# Patient Record
Sex: Female | Born: 1960 | Race: White | Hispanic: No | Marital: Married | Smoking: Former smoker
Health system: Southern US, Community
[De-identification: ages and names within clinical notes are randomized; demographics above are authoritative.]

## PROBLEM LIST (undated history)

## (undated) DIAGNOSIS — Z8719 Personal history of other diseases of the digestive system: Secondary | ICD-10-CM

## (undated) DIAGNOSIS — E785 Hyperlipidemia, unspecified: Secondary | ICD-10-CM

## (undated) DIAGNOSIS — E119 Type 2 diabetes mellitus without complications: Secondary | ICD-10-CM

## (undated) DIAGNOSIS — E039 Hypothyroidism, unspecified: Secondary | ICD-10-CM

## (undated) DIAGNOSIS — I10 Essential (primary) hypertension: Secondary | ICD-10-CM

## (undated) HISTORY — DX: Type 2 diabetes mellitus without complications: E11.9

## (undated) HISTORY — DX: Essential (primary) hypertension: I10

## (undated) HISTORY — PX: CARDIAC CATHETERIZATION: SHX172

## (undated) HISTORY — DX: Hypothyroidism, unspecified: E03.9

## (undated) HISTORY — PX: STAPEDECTOMY: SHX2435

## (undated) HISTORY — DX: Hyperlipidemia, unspecified: E78.5

---

## 1993-07-14 HISTORY — PX: ABDOMINAL HYSTERECTOMY: SHX81

## 1997-11-09 ENCOUNTER — Ambulatory Visit (HOSPITAL_COMMUNITY): Admission: RE | Admit: 1997-11-09 | Discharge: 1997-11-09 | Payer: Self-pay | Admitting: Internal Medicine

## 1999-02-26 ENCOUNTER — Other Ambulatory Visit: Admission: RE | Admit: 1999-02-26 | Discharge: 1999-02-26 | Payer: Self-pay | Admitting: Obstetrics and Gynecology

## 2001-03-01 ENCOUNTER — Other Ambulatory Visit: Admission: RE | Admit: 2001-03-01 | Discharge: 2001-03-01 | Payer: Self-pay | Admitting: Obstetrics and Gynecology

## 2002-03-02 ENCOUNTER — Other Ambulatory Visit: Admission: RE | Admit: 2002-03-02 | Discharge: 2002-03-02 | Payer: Self-pay | Admitting: Obstetrics and Gynecology

## 2005-05-19 ENCOUNTER — Encounter: Admission: RE | Admit: 2005-05-19 | Discharge: 2005-05-19 | Payer: Self-pay | Admitting: Obstetrics and Gynecology

## 2005-05-30 ENCOUNTER — Encounter: Admission: RE | Admit: 2005-05-30 | Discharge: 2005-05-30 | Payer: Self-pay | Admitting: Obstetrics and Gynecology

## 2006-05-07 ENCOUNTER — Encounter: Admission: RE | Admit: 2006-05-07 | Discharge: 2006-05-07 | Payer: Self-pay | Admitting: Obstetrics and Gynecology

## 2006-09-17 ENCOUNTER — Encounter: Admission: RE | Admit: 2006-09-17 | Discharge: 2006-09-17 | Payer: Self-pay | Admitting: General Surgery

## 2007-05-10 ENCOUNTER — Encounter: Admission: RE | Admit: 2007-05-10 | Discharge: 2007-05-10 | Payer: Self-pay | Admitting: Obstetrics and Gynecology

## 2007-05-12 ENCOUNTER — Encounter: Admission: RE | Admit: 2007-05-12 | Discharge: 2007-05-12 | Payer: Self-pay | Admitting: Obstetrics and Gynecology

## 2008-05-10 ENCOUNTER — Encounter: Admission: RE | Admit: 2008-05-10 | Discharge: 2008-05-10 | Payer: Self-pay | Admitting: Podiatry

## 2009-05-11 ENCOUNTER — Encounter: Admission: RE | Admit: 2009-05-11 | Discharge: 2009-05-11 | Payer: Self-pay | Admitting: Obstetrics and Gynecology

## 2010-05-13 ENCOUNTER — Encounter: Admission: RE | Admit: 2010-05-13 | Discharge: 2010-05-13 | Payer: Self-pay | Admitting: Obstetrics and Gynecology

## 2010-08-04 ENCOUNTER — Encounter: Payer: Self-pay | Admitting: Obstetrics and Gynecology

## 2011-03-25 ENCOUNTER — Other Ambulatory Visit: Payer: Self-pay | Admitting: Obstetrics and Gynecology

## 2011-03-25 DIAGNOSIS — Z1231 Encounter for screening mammogram for malignant neoplasm of breast: Secondary | ICD-10-CM

## 2011-05-15 ENCOUNTER — Ambulatory Visit
Admission: RE | Admit: 2011-05-15 | Discharge: 2011-05-15 | Disposition: A | Discharge status: Home / Self Care | Payer: BC Managed Care – PPO | Source: Ambulatory Visit | Attending: Obstetrics and Gynecology | Admitting: Obstetrics and Gynecology

## 2011-05-15 DIAGNOSIS — Z1231 Encounter for screening mammogram for malignant neoplasm of breast: Secondary | ICD-10-CM

## 2011-06-30 ENCOUNTER — Ambulatory Visit (INDEPENDENT_AMBULATORY_CARE_PROVIDER_SITE_OTHER): Payer: BC Managed Care – PPO | Admitting: Family Medicine

## 2011-06-30 DIAGNOSIS — E119 Type 2 diabetes mellitus without complications: Secondary | ICD-10-CM

## 2011-06-30 DIAGNOSIS — I10 Essential (primary) hypertension: Secondary | ICD-10-CM

## 2011-06-30 DIAGNOSIS — F17200 Nicotine dependence, unspecified, uncomplicated: Secondary | ICD-10-CM

## 2011-09-24 ENCOUNTER — Telehealth: Payer: Self-pay

## 2011-09-29 ENCOUNTER — Ambulatory Visit: Payer: BC Managed Care – PPO | Admitting: Family Medicine

## 2011-10-20 ENCOUNTER — Ambulatory Visit (INDEPENDENT_AMBULATORY_CARE_PROVIDER_SITE_OTHER): Payer: BC Managed Care – PPO | Admitting: Family Medicine

## 2011-10-20 ENCOUNTER — Encounter: Payer: Self-pay | Admitting: Family Medicine

## 2011-10-20 VITALS — BP 124/71 | HR 68 | Temp 98.2°F | Resp 16 | Ht 63.75 in | Wt 130.2 lb

## 2011-10-20 DIAGNOSIS — M7700 Medial epicondylitis, unspecified elbow: Secondary | ICD-10-CM

## 2011-10-20 DIAGNOSIS — J309 Allergic rhinitis, unspecified: Secondary | ICD-10-CM | POA: Insufficient documentation

## 2011-10-20 DIAGNOSIS — E119 Type 2 diabetes mellitus without complications: Secondary | ICD-10-CM

## 2011-10-20 DIAGNOSIS — E785 Hyperlipidemia, unspecified: Secondary | ICD-10-CM

## 2011-10-20 DIAGNOSIS — E039 Hypothyroidism, unspecified: Secondary | ICD-10-CM

## 2011-10-20 DIAGNOSIS — F17200 Nicotine dependence, unspecified, uncomplicated: Secondary | ICD-10-CM

## 2011-10-20 DIAGNOSIS — I10 Essential (primary) hypertension: Secondary | ICD-10-CM

## 2011-10-20 LAB — LIPID PANEL
Cholesterol: 145 mg/dL (ref 0–200)
HDL: 35 mg/dL — ABNORMAL LOW (ref >39)
LDL Cholesterol: 78 mg/dL (ref 0–99)
Total CHOL/HDL Ratio: 4.1 Ratio
Triglycerides: 159 mg/dL — ABNORMAL HIGH (ref <150)
VLDL: 32 mg/dL (ref 0–40)

## 2011-10-20 LAB — POCT GLYCOSYLATED HEMOGLOBIN (HGB A1C): Hemoglobin A1C: 6.7

## 2011-10-20 NOTE — Progress Notes (Signed)
  Subjective:    Patient ID: Carmen Anderson, female    DOB: Jul 18, 1960, 51 y.o.   MRN: [id number]  HPI Patient presents for routine follow up.  DM- compliant with medications; denies side effects.         BS's all under 120!  Last eye exam 1/13.  No evidence of diabetic retinopathy.         No future neuropathic symptoms with Elavil. No side effects with Elavil.  Hypothyroid- fatigue secondary to job promotion; patient does not believe symptoms  secondary to thyroid disease.  Hypertension- Denies headaches, SOB or CP.  Dyslipidemia- Denies myalgas or nausea with Statin.  (B) Elbow pain; medial aspect since lifting file folders.  Allergic Rhinitis- using Zyrtec regularly  Exercising several days a week. Review of Systems     Objective:   Physical Exam  Constitutional: She appears well-developed.  HENT:  Nose: Nose normal.  Mouth/Throat: Oropharynx is clear and moist.  Neck: Neck supple. No thyromegaly present.  Cardiovascular: Normal rate, regular rhythm and normal heart sounds.   Pulmonary/Chest: Breath sounds normal.  Abdominal: Soft. Bowel sounds are normal. There is no hepatosplenomegaly. There is no tenderness.  Musculoskeletal: Normal range of motion.  Lymphadenopathy:    She has no cervical adenopathy.  Neurological: She is alert.  Skin: Skin is warm.       Neuropathic changes; no ulcerations or other lesions      Results for orders placed in visit on 10/20/11  POCT GLYCOSYLATED HEMOGLOBIN (HGB A1C)      Component Value Range   Hemoglobin A1C 6.7         Assessment & Plan:   1. Hyperlipidemia  Lipid panel  2. Hypertension    3. Allergic rhinitis    4. Hypothyroid    5. DM type 2 (diabetes mellitus, type 2)  POCT glycosylated hemoglobin (Hb A1C), Microalbumin, urine  6. Medial epicondylitis of elbow    7. Tobacco dependence      Will contact patient once labs are back Counseled patient regarding the importance of smoking cessation.  Offered to help  once patient ready, Anticipatory guidance

## 2011-10-21 LAB — MICROALBUMIN, URINE: Microalb, Ur: 0.5 mg/dL (ref 0.00–1.89)

## 2011-12-31 ENCOUNTER — Other Ambulatory Visit: Payer: Self-pay | Admitting: Family Medicine

## 2012-01-05 ENCOUNTER — Other Ambulatory Visit: Payer: Self-pay | Admitting: Family Medicine

## 2012-01-19 ENCOUNTER — Encounter: Payer: BC Managed Care – PPO | Admitting: Family Medicine

## 2012-01-21 ENCOUNTER — Ambulatory Visit (INDEPENDENT_AMBULATORY_CARE_PROVIDER_SITE_OTHER): Payer: BC Managed Care – PPO | Admitting: Family Medicine

## 2012-01-21 ENCOUNTER — Encounter: Payer: Self-pay | Admitting: Family Medicine

## 2012-01-21 VITALS — BP 104/64 | HR 63 | Temp 97.0°F | Resp 16 | Ht 63.5 in | Wt 122.4 lb

## 2012-01-21 DIAGNOSIS — E039 Hypothyroidism, unspecified: Secondary | ICD-10-CM

## 2012-01-21 DIAGNOSIS — E785 Hyperlipidemia, unspecified: Secondary | ICD-10-CM

## 2012-01-21 DIAGNOSIS — E119 Type 2 diabetes mellitus without complications: Secondary | ICD-10-CM

## 2012-01-21 DIAGNOSIS — IMO0001 Reserved for inherently not codable concepts without codable children: Secondary | ICD-10-CM

## 2012-01-21 DIAGNOSIS — M7710 Lateral epicondylitis, unspecified elbow: Secondary | ICD-10-CM

## 2012-01-21 LAB — LIPID PANEL
Cholesterol: 129 mg/dL (ref 0–200)
HDL: 32 mg/dL — ABNORMAL LOW (ref >39)
LDL Cholesterol: 72 mg/dL (ref 0–99)
Total CHOL/HDL Ratio: 4 Ratio
Triglycerides: 125 mg/dL (ref <150)
VLDL: 25 mg/dL (ref 0–40)

## 2012-01-21 LAB — BASIC METABOLIC PANEL
BUN: 10 mg/dL (ref 6–23)
CO2: 27 mEq/L (ref 19–32)
Calcium: 10.2 mg/dL (ref 8.4–10.5)
Chloride: 105 mEq/L (ref 96–112)
Creat: 0.66 mg/dL (ref 0.50–1.10)
Glucose, Bld: 139 mg/dL — ABNORMAL HIGH (ref 70–99)
Potassium: 4.5 mEq/L (ref 3.5–5.3)
Sodium: 140 mEq/L (ref 135–145)

## 2012-01-21 LAB — ALT: ALT: 14 U/L (ref 0–35)

## 2012-01-21 LAB — TSH: TSH: 2.243 u[IU]/mL (ref 0.350–4.500)

## 2012-01-21 LAB — POCT GLYCOSYLATED HEMOGLOBIN (HGB A1C): Hemoglobin A1C: 6.8

## 2012-01-21 MED ORDER — ATORVASTATIN CALCIUM 40 MG PO TABS: 40.0000 mg | ORAL_TABLET | Freq: Every day | ORAL | Status: DC

## 2012-01-21 MED ORDER — LISINOPRIL 5 MG PO TABS: 5.0000 mg | ORAL_TABLET | Freq: Every day | ORAL | Status: DC

## 2012-01-21 MED ORDER — AMITRIPTYLINE HCL 10 MG PO TABS: 10.0000 mg | ORAL_TABLET | Freq: Every day | ORAL | Status: DC

## 2012-01-21 MED ORDER — METFORMIN HCL ER 500 MG PO TB24: ORAL_TABLET | ORAL | Status: DC

## 2012-01-21 MED ORDER — LEVOTHYROXINE SODIUM 50 MCG PO TABS: 50.0000 ug | ORAL_TABLET | Freq: Every day | ORAL | Status: DC

## 2012-01-21 NOTE — Progress Notes (Signed)
  Subjective:    Patient ID: Carmen Anderson, female    DOB: 02/05/1961, 51 y.o.   MRN: [id number]  HPI  This 51 y.o. Cauc female has Type II DM, HTN, Hypothyroidism and Hyperlipidemia. She also has   mild Diabetic Neuropathy. FSBS= 80- <150s. Pt and husband are eating healthy/ following a meal plan  and walking. Pt has lost > 10 pounds. She denies hypoglycemia. Current Metformin dose is 1 tab after breakfast  and 2 tabs after evening meal. No medication intolerance.         New complaint: left elbow pain x few weeks. No acute injury but has increased pain when reaching overhead and  when lifting items weighing more than a few pounds. No swelling or redness. Has not taken any OTC analgesics; tries  to avoid NSAIDs because of concern for kidneys.  Review of Systems  Constitutional: Negative.   Eyes: Negative for visual disturbance.  Respiratory: Negative for chest tightness and shortness of breath.   Cardiovascular: Negative for chest pain, palpitations and leg swelling.  Gastrointestinal: Negative for abdominal pain and abdominal distention.  Musculoskeletal:       Left elbow pain- lateral aspect  Neurological: Negative.        Objective:   Physical Exam  Nursing note and vitals reviewed. Constitutional: She is oriented to person, place, and time. She appears well-developed and well-nourished. No distress.  HENT:  Head: Normocephalic and atraumatic.  Eyes: Conjunctivae and EOM are normal. No scleral icterus.  Cardiovascular: Normal rate and regular rhythm.   Pulmonary/Chest: Effort normal. No respiratory distress.  Musculoskeletal: Normal range of motion. She exhibits no edema.       Left elbow: tender lateral epicondyle, no redness,effusion or swelling  Neurological: She is alert and oriented to person, place, and time. No cranial nerve deficit.  Skin: Skin is warm and dry.   Results for orders placed in visit on 01/21/12  POCT GLYCOSYLATED HEMOGLOBIN (HGB A1C)      Component  Value Range   Hemoglobin A1C 6.8            Assessment & Plan:   1. Type II or unspecified type diabetes mellitus without mention of complication, not stated as uncontrolled  Basic metabolic panel Continue current dose of Metformin 500 mg 1 tablet after breakfast and 2 tabs after dinner; RF 90-day with 1 RF Continue with other lifestyle changes (weight down 11 lbs since Jan 2013)  2. Hypothyroidism  TSH Medication RF: Levothyroxine 50 mcg  90-day supply with 1 RF  3. Dyslipidemia  Lipid panel, ALT Medication RF: Atorvastatin 90-day supply with 1 RF  4. Epicondylitis, lateral (tennis elbow)  Symptomatic treatment with topical analgesic

## 2012-01-21 NOTE — Patient Instructions (Signed)
Tennis Elbow Your caregiver has diagnosed you with a condition often referred to as "tennis elbow." This results from small tears or soreness (inflammation) at the start (origin) of the extensor muscles of the forearm. Although the condition is often called tennis or golfer's elbow, it is caused by any repetitive action performed by your elbow. HOME CARE INSTRUCTIONS  If the condition has been short lived, rest may be the only treatment required. Using your opposite hand or arm to perform the task may help. Even changing your grip may help rest the extremity. These may even prevent the condition from recurring.   Longer standing problems, however, will often be relieved faster by:   Using anti-inflammatory agents.   Applying ice packs for 30 minutes at the end of the working day, at bed time, or when activities are finished.   Your caregiver may also have you wear a splint or sling. This will allow the inflamed tendon to heal.  At times, steroid injections aided with a local anesthetic will be required along with splinting for 1 to 2 weeks. Two to three steroid injections will often solve the problem. In some long standing cases, the inflamed tendon does not respond to conservative (non-surgical) therapy. Then surgery may be required to repair it. MAKE SURE YOU:   Understand these instructions.   Will watch your condition.   Will get help right away if you are not doing well or get worse.  Document Released: 06/30/2005 Document Revised: 06/19/2011 Document Reviewed: 02/16/2008 [facility] Patient Information 2012 [place], [place].    Tendinitis Tendinitis is swelling and inflammation of the tendons. Tendons are band-like tissues that connect muscle to bone. Tendinitis commonly occurs in the:   Shoulders (rotator cuff).   Heels (Achilles tendon).   Elbows (triceps tendon).  CAUSES Tendinitis is usually caused by overusing the tendon, muscles, and joints involved. When the tissue  surrounding a tendon (synovium) becomes inflamed, it is called tenosynovitis. Tendinitis commonly develops in people whose jobs require repetitive motions. SYMPTOMS  Pain.   Tenderness.   Mild swelling.  DIAGNOSIS Tendinitis is usually diagnosed by physical exam. Your caregiver may also order X-rays or other imaging tests. TREATMENT Your caregiver may recommend certain medicines or exercises for your treatment. HOME CARE INSTRUCTIONS   Use a sling or splint for as long as directed by your caregiver until the pain decreases.   Put ice on the injured area.   Put ice in a plastic bag.   Place a towel between your skin and the bag.   Leave the ice on for 15 to 20 minutes, 3 to 4 times a day.   Avoid using the limb while the tendon is painful. Perform gentle range of motion exercises only as directed by your caregiver. Stop exercises if pain or discomfort increase, unless directed otherwise by your caregiver.   Only take over-the-counter or prescription medicines for pain, discomfort, or fever as directed by your caregiver.  SEEK MEDICAL CARE IF:   Your pain and swelling increase.   You develop new, unexplained symptoms, especially increased numbness in the hands.  MAKE SURE YOU:   Understand these instructions.   Will watch your condition.   Will get help right away if you are not doing well or get worse.  Document Released: 06/27/2000 Document Revised: 06/19/2011 Document Reviewed: 09/16/2010 [facility] Patient Information 2012 [place], [place].

## 2012-01-23 NOTE — Progress Notes (Signed)
Quick Note:  Please call pt and advise that the following labs are abnormal... Blood sugar a little elevated; continue current medication and treatment for Diabetes.  HDL ("good") cholesterol is too low; regular exercise and consuming olive oil and other "good" fats will help raise this number. Stay on medication for lipids.  Total cholesterol , LDL ("bad") cholesterol and Triglycerides are great!  Copy to pt. ______

## 2012-01-27 ENCOUNTER — Encounter: Payer: Self-pay | Admitting: Radiology

## 2012-02-02 ENCOUNTER — Other Ambulatory Visit: Payer: Self-pay | Admitting: Family Medicine

## 2012-02-23 ENCOUNTER — Other Ambulatory Visit: Payer: Self-pay | Admitting: Obstetrics and Gynecology

## 2012-02-23 ENCOUNTER — Other Ambulatory Visit: Payer: Self-pay | Admitting: Family Medicine

## 2012-02-23 DIAGNOSIS — Z1231 Encounter for screening mammogram for malignant neoplasm of breast: Secondary | ICD-10-CM

## 2012-04-21 ENCOUNTER — Ambulatory Visit (INDEPENDENT_AMBULATORY_CARE_PROVIDER_SITE_OTHER): Payer: BC Managed Care – PPO | Admitting: Family Medicine

## 2012-04-21 ENCOUNTER — Encounter: Payer: Self-pay | Admitting: Family Medicine

## 2012-04-21 VITALS — BP 114/76 | HR 60 | Temp 97.6°F | Resp 16 | Ht 63.75 in | Wt 123.0 lb

## 2012-04-21 DIAGNOSIS — E119 Type 2 diabetes mellitus without complications: Secondary | ICD-10-CM

## 2012-04-21 DIAGNOSIS — I10 Essential (primary) hypertension: Secondary | ICD-10-CM

## 2012-04-21 LAB — POCT GLYCOSYLATED HEMOGLOBIN (HGB A1C): Hemoglobin A1C: 6.5

## 2012-04-21 MED ORDER — AMITRIPTYLINE HCL 10 MG PO TABS: 10.0000 mg | ORAL_TABLET | Freq: Every day | ORAL | Status: DC

## 2012-04-21 MED ORDER — LISINOPRIL 5 MG PO TABS: 5.0000 mg | ORAL_TABLET | Freq: Every day | ORAL | Status: DC

## 2012-04-21 MED ORDER — ATORVASTATIN CALCIUM 40 MG PO TABS: 40.0000 mg | ORAL_TABLET | Freq: Every day | ORAL | Status: DC

## 2012-04-21 MED ORDER — LEVOTHYROXINE SODIUM 50 MCG PO TABS: 50.0000 ug | ORAL_TABLET | Freq: Every day | ORAL | Status: DC

## 2012-04-21 MED ORDER — METFORMIN HCL ER 500 MG PO TB24: ORAL_TABLET | ORAL | Status: DC

## 2012-04-21 NOTE — Patient Instructions (Addendum)
Your Diabetes is well controlled; you have refills for 1 year. Next appt is for complete physical.

## 2012-04-22 NOTE — Progress Notes (Signed)
S: This 51 y.o. Cauc female is here for medication refills and brief follow-up for HTN, DM and lipid disorder. Pt also takes medication for hypothyroidism. She is compliant with medications and has no adverse effects. FSBS have been 90-120. Pt denies hypoglycemia, diaphoresis, fatigue, CP or tightness,  palpitations, SOB, abd pain, polyuria, myalgias or back pain, HA, dizziness, numbness or weakness.  ROS: As per HPI.  O:  Filed Vitals:   04/21/12 0749  BP: 114/76                                 Weight down 7 lbs.  Pulse: 60  Temp: 97.6 F (36.4 C)  Resp: 16   GEN: In NAD; WN,WD. HEENT: [place]/AT; EOMI with clear conj/scl. Otherwise normal. COR: RRR. No edema. LUNGS: Normal resp rate; unlabored. MS: MAEs; no deformities SKIN: No ulcerations or other lesions on feet. NEURO: A&O x 3; CNs intact; Nonfocal.  Results for orders placed in visit on 04/21/12  POCT GLYCOSYLATED HEMOGLOBIN (HGB A1C)      Component Value Range   Hemoglobin A1C 6.5      A/P:  1. Type II or unspecified type diabetes mellitus without mention of complication, not stated as uncontrolled - well controlled  Continue current medications and lifestyle choices  2. HTN, goal below 130/80 - excellent control No change of current management.

## 2012-05-17 ENCOUNTER — Ambulatory Visit
Admission: RE | Admit: 2012-05-17 | Discharge: 2012-05-17 | Disposition: A | Discharge status: Home / Self Care | Payer: BC Managed Care – PPO | Source: Ambulatory Visit | Attending: Family Medicine | Admitting: Family Medicine

## 2012-05-17 DIAGNOSIS — Z1231 Encounter for screening mammogram for malignant neoplasm of breast: Secondary | ICD-10-CM

## 2012-07-01 ENCOUNTER — Other Ambulatory Visit: Payer: Self-pay | Admitting: Family Medicine

## 2012-09-28 ENCOUNTER — Other Ambulatory Visit: Payer: Self-pay | Admitting: Family Medicine

## 2012-10-20 ENCOUNTER — Encounter: Payer: Self-pay | Admitting: Family Medicine

## 2012-10-20 ENCOUNTER — Ambulatory Visit (INDEPENDENT_AMBULATORY_CARE_PROVIDER_SITE_OTHER): Payer: BC Managed Care – PPO | Admitting: Family Medicine

## 2012-10-20 VITALS — BP 120/70 | HR 65 | Temp 97.5°F | Resp 16 | Ht 63.5 in | Wt 118.4 lb

## 2012-10-20 DIAGNOSIS — K625 Hemorrhage of anus and rectum: Secondary | ICD-10-CM

## 2012-10-20 DIAGNOSIS — E039 Hypothyroidism, unspecified: Secondary | ICD-10-CM

## 2012-10-20 DIAGNOSIS — Z23 Encounter for immunization: Secondary | ICD-10-CM

## 2012-10-20 DIAGNOSIS — Z0000 Encounter for general adult medical examination without abnormal findings: Secondary | ICD-10-CM

## 2012-10-20 DIAGNOSIS — E785 Hyperlipidemia, unspecified: Secondary | ICD-10-CM

## 2012-10-20 DIAGNOSIS — Z113 Encounter for screening for infections with a predominantly sexual mode of transmission: Secondary | ICD-10-CM

## 2012-10-20 DIAGNOSIS — Z1159 Encounter for screening for other viral diseases: Secondary | ICD-10-CM

## 2012-10-20 DIAGNOSIS — E119 Type 2 diabetes mellitus without complications: Secondary | ICD-10-CM

## 2012-10-20 DIAGNOSIS — I10 Essential (primary) hypertension: Secondary | ICD-10-CM

## 2012-10-20 LAB — CBC WITH DIFFERENTIAL/PLATELET
Basophils Absolute: 0.1 10*3/uL (ref 0.0–0.1)
Basophils Relative: 1 % (ref 0–1)
Eosinophils Absolute: 0.5 10*3/uL (ref 0.0–0.7)
Eosinophils Relative: 4 % (ref 0–5)
HCT: 40.5 % (ref 36.0–46.0)
Hemoglobin: 13.7 g/dL (ref 12.0–15.0)
Lymphocytes Relative: 24 % (ref 12–46)
Lymphs Abs: 3 10*3/uL (ref 0.7–4.0)
MCH: 30 pg (ref 26.0–34.0)
MCHC: 33.8 g/dL (ref 30.0–36.0)
MCV: 88.6 fL (ref 78.0–100.0)
Monocytes Absolute: 0.9 10*3/uL (ref 0.1–1.0)
Monocytes Relative: 7 % (ref 3–12)
Neutro Abs: 8.2 10*3/uL — ABNORMAL HIGH (ref 1.7–7.7)
Neutrophils Relative %: 64 % (ref 43–77)
Platelets: 329 10*3/uL (ref 150–400)
RBC: 4.57 MIL/uL (ref 3.87–5.11)
RDW: 13.2 % (ref 11.5–15.5)
WBC: 12.7 10*3/uL — ABNORMAL HIGH (ref 4.0–10.5)

## 2012-10-20 LAB — COMPREHENSIVE METABOLIC PANEL
ALT: 12 U/L (ref 0–35)
AST: 14 U/L (ref 0–37)
Albumin: 4.7 g/dL (ref 3.5–5.2)
Alkaline Phosphatase: 63 U/L (ref 39–117)
BUN: 9 mg/dL (ref 6–23)
CO2: 24 mEq/L (ref 19–32)
Calcium: 9.6 mg/dL (ref 8.4–10.5)
Chloride: 109 mEq/L (ref 96–112)
Creat: 0.65 mg/dL (ref 0.50–1.10)
Glucose, Bld: 120 mg/dL — ABNORMAL HIGH (ref 70–99)
Potassium: 4 mEq/L (ref 3.5–5.3)
Sodium: 143 mEq/L (ref 135–145)
Total Bilirubin: 0.4 mg/dL (ref 0.3–1.2)
Total Protein: 7 g/dL (ref 6.0–8.3)

## 2012-10-20 LAB — LIPID PANEL
Cholesterol: 128 mg/dL (ref 0–200)
HDL: 38 mg/dL — ABNORMAL LOW (ref >39)
LDL Cholesterol: 74 mg/dL (ref 0–99)
Total CHOL/HDL Ratio: 3.4 Ratio
Triglycerides: 82 mg/dL (ref <150)
VLDL: 16 mg/dL (ref 0–40)

## 2012-10-20 LAB — POCT URINALYSIS DIPSTICK
Bilirubin, UA: NEGATIVE
Blood, UA: NEGATIVE
Glucose, UA: NEGATIVE
Ketones, UA: NEGATIVE
Leukocytes, UA: NEGATIVE
Nitrite, UA: NEGATIVE
Protein, UA: NEGATIVE
Spec Grav, UA: <=1.005
Urobilinogen, UA: 0.2
pH, UA: 5.5

## 2012-10-20 LAB — TSH: TSH: 1.888 u[IU]/mL (ref 0.350–4.500)

## 2012-10-20 LAB — POCT GLYCOSYLATED HEMOGLOBIN (HGB A1C): Hemoglobin A1C: 6.6

## 2012-10-20 LAB — HEPATITIS C ANTIBODY: HCV Ab: NEGATIVE

## 2012-10-20 LAB — IFOBT (OCCULT BLOOD): IFOBT: NEGATIVE

## 2012-10-20 MED ORDER — AMITRIPTYLINE HCL 10 MG PO TABS: 10.0000 mg | ORAL_TABLET | Freq: Every day | ORAL | Status: DC

## 2012-10-20 MED ORDER — LISINOPRIL 5 MG PO TABS: 5.0000 mg | ORAL_TABLET | Freq: Every day | ORAL | Status: DC

## 2012-10-20 MED ORDER — LEVOTHYROXINE SODIUM 50 MCG PO TABS: 50.0000 ug | ORAL_TABLET | Freq: Every day | ORAL | Status: DC

## 2012-10-20 MED ORDER — METFORMIN HCL ER 500 MG PO TB24: ORAL_TABLET | ORAL | Status: DC

## 2012-10-20 MED ORDER — ATORVASTATIN CALCIUM 40 MG PO TABS: 40.0000 mg | ORAL_TABLET | Freq: Every day | ORAL | Status: DC

## 2012-10-20 NOTE — Patient Instructions (Signed)
Keeping You Healthy  Get These Tests  Blood Pressure- Have your blood pressure checked by your healthcare provider at least once a year.  Normal blood pressure is 120/80.  Weight- Have your body mass index (BMI) calculated to screen for obesity.  BMI is a measure of body fat based on height and weight.  You can calculate your own BMI at https://www.west-esparza.com/  Cholesterol- Have your cholesterol checked every year.  Diabetes- Have your blood sugar checked every year if you have high blood pressure, high cholesterol, a family history of diabetes or if you are overweight.  Pap Smear- Have a pap smear every 1 to 3 years if you have been sexually active.  If you are older than 65 and recent pap smears have been normal you may not need additional pap smears.  In addition, if you have had a hysterectomy  For benign disease additional pap smears are not necessary.  Mammogram-Yearly mammograms are essential for early detection of breast cancer  Screening for Colon Cancer- Colonoscopy starting at age 6. Screening may begin sooner depending on your family history and other health conditions.  Follow up colonoscopy as directed by your Gastroenterologist.  Screening for Osteoporosis- Screening begins at age 52 with bone density scanning, sooner if you are at higher risk for developing Osteoporosis.  Get these medicines  Calcium with Vitamin D- Your body requires 1200-1500 mg of Calcium a day and [id number] IU of Vitamin D a day.  You can only absorb 500 mg of Calcium at a time therefore Calcium must be taken in 2 or 3 separate doses throughout the day.  Hormones- Hormone therapy has been associated with increased risk for certain cancers and heart disease.  Talk to your healthcare provider about if you need relief from menopausal symptoms.  Aspirin- Ask your healthcare provider about taking Aspirin to prevent Heart Disease and Stroke.  Get these Immuniztions  Flu shot- Every fall  Pneumonia  shot- Once after the age of 57; if you are younger ask your healthcare provider if you need a pneumonia shot.  Tetanus- Every ten years.  You received a Tdap today; this is a one-time vaccine. Next Tetanus is due in 2024.  Zostavax- Once after the age of 30 to prevent shingles.  Take these steps  Don't smoke- Your healthcare provider can help you quit. For tips on how to quit, ask your healthcare provider or go to www.smokefree.gov or call 1-800 QUIT-NOW.  Be physically active- Exercise 5 days a week for a minimum of 30 minutes.  If you are not already physically active, start slow and gradually work up to 30 minutes of moderate physical activity.  Try walking, dancing, bike riding, swimming, etc.  Eat a healthy diet- Eat a variety of healthy foods such as fruits, vegetables, whole grains, low fat milk, low fat cheeses, yogurt, lean meats, chicken, fish, eggs, dried beans, tofu, etc.  For more information go to www.thenutritionsource.org  Dental visit- Brush and floss teeth twice daily; visit your dentist twice a year.  Eye exam- Visit your Optometrist or Ophthalmologist yearly.  Drink alcohol in moderation- Limit alcohol intake to one drink or less a day.  Never drink and drive.  Depression- Your emotional health is as important as your physical health.  If you're feeling down or losing interest in things you normally enjoy, please talk to your healthcare provider.  Seat Belts- can save your life; always wear one  Smoke/Carbon Monoxide detectors- These detectors need to be installed on the  appropriate level of your home.  Replace batteries at least once a year.  Violence- If anyone is threatening or hurting you, please tell your healthcare provider.  Living Will/ Health care power of attorney- Discuss with your healthcare provider and family.

## 2012-10-20 NOTE — Progress Notes (Signed)
Subjective:    Patient ID: Carmen Anderson, female    DOB: 1961-01-12, 52 y.o.   MRN: [id number]  HPI  This 52 y.o. Cauc female is here for CPE; she has Type II DM and HTN. Pt has concerns  about weight loss; she maintains healthy nutrition. Hearing impairment has been addressed; pt  now has bilateral hearing aids.   Last MMG: Nov 2013 (normal)  Last Vision exam: 2014    Review of Systems  Constitutional: Positive for fatigue and unexpected weight change. Negative for fever and activity change.  Gastrointestinal: Positive for blood in stool and anal bleeding. Negative for abdominal pain, constipation and rectal pain.  Skin: Positive for rash.       Recurrent "bumps" on perineum that resolve untreated.  Hematological: Negative.   Psychiatric/Behavioral: Positive for sleep disturbance.  All other systems reviewed and are negative.       Objective:   Physical Exam  Nursing note and vitals reviewed. Constitutional: She is oriented to person, place, and time. Vital signs are normal. She appears well-developed and well-nourished. No distress.  HENT:  Head: Normocephalic and atraumatic.  Right Ear: External ear and ear canal normal. Tympanic membrane is scarred. Tympanic membrane is not erythematous and not retracted. No middle ear effusion. Decreased hearing is noted.  Left Ear: External ear and ear canal normal. Tympanic membrane is scarred. Tympanic membrane is not erythematous and not retracted.  No middle ear effusion. Decreased hearing is noted.  Nose: Nose normal. No mucosal edema, rhinorrhea, nasal deformity or septal deviation.  Mouth/Throat: Uvula is midline, oropharynx is clear and moist and mucous membranes are normal. No oral lesions. Normal dentition. No oropharyngeal exudate.  Pt has bilateral hearing aids.  Eyes: Conjunctivae, EOM and lids are normal. Pupils are equal, round, and reactive to light. No scleral icterus.  Neck: Normal range of motion. Neck supple. No  thyromegaly present.  Cardiovascular: Normal rate, regular rhythm, normal heart sounds and intact distal pulses.  Exam reveals no gallop and no friction rub.   No murmur heard. Pulmonary/Chest: Effort normal and breath sounds normal. No respiratory distress. She exhibits no tenderness. Right breast exhibits no inverted nipple, no mass, no nipple discharge, no skin change and no tenderness. Left breast exhibits no inverted nipple, no mass, no nipple discharge, no skin change and no tenderness. Breasts are symmetrical.  Abdominal: Soft. Normal appearance and bowel sounds are normal. She exhibits no distension, no pulsatile midline mass and no mass. There is no hepatosplenomegaly. There is no tenderness. There is no guarding and no CVA tenderness. No hernia.  Genitourinary: Vagina normal. Rectal exam shows external hemorrhoid. Rectal exam shows no fissure, no mass, no tenderness and anal tone normal. There is rash on the right labia. There is no tenderness or lesion on the right labia. There is rash on the left labia. There is no tenderness or lesion on the left labia. Right adnexum displays no mass and no tenderness. Left adnexum displays no mass and no tenderness. No erythema or tenderness around the vagina. No vaginal discharge found.  NEFG; shallow ulcerations on L buttock.. Cervix/ uterus absent; vaginal cuff intact.  Musculoskeletal: Normal range of motion. She exhibits no edema and no tenderness.  Lymphadenopathy:    She has no cervical adenopathy.       Right: No inguinal adenopathy present.       Left: No inguinal adenopathy present.  Neurological: She is alert and oriented to person, place, and time. She has normal reflexes.  No cranial nerve deficit. She exhibits normal muscle tone. Coordination normal.  Skin: Skin is warm and dry. No erythema. No pallor.  Psychiatric: She has a normal mood and affect. Her behavior is normal. Judgment and thought content normal.    A1c=6.6%     Assessment  & Plan:  Routine general medical examination at a health care facility - Plan: POCT urinalysis dipstick, CBC with Differential  Rectal bleeding - pt has ext hemorrhoid.   Plan: IFOBT POC (occult bld, rslt in office), Ambulatory referral to Gastroenterology  Type II or unspecified type diabetes mellitus without mention of complication, not stated as uncontrolled - Plan: POCT glycosylated hemoglobin (Hb A1C)  HTN (hypertension) - stable; continue current medication.  Plan: Comprehensive metabolic panel  Unspecified hypothyroidism - Plan: TSH; maintain current dose pending labs.  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   Plan: HSV(herpes simplex vrs) 1+2 ab-IgG  Need for prophylactic vaccination with combined diphtheria-tetanus-pertussis (DTP) vaccine - Plan: Tdap vaccine greater than or equal to 7yo IM  Advised smoking cessation.

## 2012-10-21 ENCOUNTER — Encounter: Payer: Self-pay | Admitting: Family Medicine

## 2012-10-21 DIAGNOSIS — Z720 Tobacco use: Secondary | ICD-10-CM | POA: Insufficient documentation

## 2012-10-21 LAB — HSV(HERPES SIMPLEX VRS) I + II AB-IGG
HSV 1 Glycoprotein G Ab, IgG: 13.06 IV — ABNORMAL HIGH
HSV 2 Glycoprotein G Ab, IgG: <0.1 IV

## 2013-01-19 ENCOUNTER — Ambulatory Visit: Payer: BC Managed Care – PPO | Admitting: Family Medicine

## 2013-01-26 ENCOUNTER — Ambulatory Visit (INDEPENDENT_AMBULATORY_CARE_PROVIDER_SITE_OTHER): Payer: BC Managed Care – PPO | Admitting: Family Medicine

## 2013-01-26 ENCOUNTER — Encounter: Payer: Self-pay | Admitting: Family Medicine

## 2013-01-26 VITALS — BP 112/69 | HR 70 | Temp 97.6°F | Resp 16 | Ht 64.0 in | Wt 120.0 lb

## 2013-01-26 DIAGNOSIS — E119 Type 2 diabetes mellitus without complications: Secondary | ICD-10-CM

## 2013-01-26 DIAGNOSIS — I10 Essential (primary) hypertension: Secondary | ICD-10-CM

## 2013-01-26 LAB — POCT GLYCOSYLATED HEMOGLOBIN (HGB A1C): Hemoglobin A1C: 6.7

## 2013-01-26 NOTE — Progress Notes (Signed)
S:  This 52 y.o. Cauc female has HTN and Type II DM- controlled. She checks FSBS at least once a day; has had only 1 episode of hypoglycemia (FSBS=48) one morning when she slept late. Manages her nutrition by limiting starchy foods and sweets. Feels well; compliant w/ medications/ Denies adverse effects. Recalls mother having DM and dying w/ massive MI while on dialysis. She is determined to manage her DM well. She works full time for [employer] and  briefly discussed some changes in the future.  Patient Active Problem List   Diagnosis Date Noted  . Tobacco user 10/21/2012  . Hyperlipidemia   . Hypertension   . Allergic rhinitis   . Hypothyroid   . DM type 2 (diabetes mellitus, type 2)    PMHx, Soc Hx and Fam Hx rviewed.  ROS: Negative for diaphoresis, CP or tightness, palpitations, SOB or DOE, HA, dizziness, numbness, weakness or syncope.  O: Filed Vitals:   01/26/13 0835  BP: 112/69  Pulse: 70  Temp: 97.6 F (36.4 C)  Resp: 16   GEN: in NAD; WN,WD. HENT: [place]/AT; EOMI w/ clear conj/sclerae. Otherwise unremarkable. COR: RRR. LUNGS: Normal resp rate and effort. SKIN: W&D. No pallor or jaundice. NEURO: A&O x 3; CNs intact. Nonfocal.  Results for orders placed in visit on 01/26/13  POCT GLYCOSYLATED HEMOGLOBIN (HGB A1C)      Result Value Range   Hemoglobin A1C 6.7      A/P: Type II or unspecified type diabetes mellitus without mention of complication, not stated as uncontrolled - Stable and controlled.  No medication changes.  Plan: Continue current self- management.  HTN (hypertension)- Stable and well controlled. No changes at this time.

## 2013-01-26 NOTE — Patient Instructions (Addendum)
Your Diabetes and other medical problems are well controlled; continue taking good care of yourself and I will see you in 6 months.

## 2013-02-16 ENCOUNTER — Other Ambulatory Visit: Payer: Self-pay

## 2013-05-03 ENCOUNTER — Other Ambulatory Visit: Payer: Self-pay

## 2013-05-03 DIAGNOSIS — Z1231 Encounter for screening mammogram for malignant neoplasm of breast: Secondary | ICD-10-CM

## 2013-05-19 ENCOUNTER — Other Ambulatory Visit: Payer: Self-pay

## 2013-05-23 ENCOUNTER — Ambulatory Visit
Admission: RE | Admit: 2013-05-23 | Discharge: 2013-05-23 | Disposition: A | Discharge status: Home / Self Care | Payer: BC Managed Care – PPO | Source: Ambulatory Visit

## 2013-05-23 DIAGNOSIS — Z1231 Encounter for screening mammogram for malignant neoplasm of breast: Secondary | ICD-10-CM

## 2013-08-03 ENCOUNTER — Encounter: Payer: Self-pay | Admitting: Family Medicine

## 2013-08-03 ENCOUNTER — Ambulatory Visit (INDEPENDENT_AMBULATORY_CARE_PROVIDER_SITE_OTHER): Payer: BC Managed Care – PPO | Admitting: Family Medicine

## 2013-08-03 VITALS — BP 130/70 | HR 60 | Temp 97.8°F | Resp 16 | Ht 63.5 in | Wt 118.0 lb

## 2013-08-03 DIAGNOSIS — E785 Hyperlipidemia, unspecified: Secondary | ICD-10-CM

## 2013-08-03 DIAGNOSIS — E039 Hypothyroidism, unspecified: Secondary | ICD-10-CM

## 2013-08-03 DIAGNOSIS — R209 Unspecified disturbances of skin sensation: Secondary | ICD-10-CM

## 2013-08-03 DIAGNOSIS — E119 Type 2 diabetes mellitus without complications: Secondary | ICD-10-CM

## 2013-08-03 DIAGNOSIS — R202 Paresthesia of skin: Secondary | ICD-10-CM

## 2013-08-03 LAB — BASIC METABOLIC PANEL
BUN: 9 mg/dL (ref 6–23)
CO2: 23 mEq/L (ref 19–32)
Calcium: 9.8 mg/dL (ref 8.4–10.5)
Chloride: 105 mEq/L (ref 96–112)
Creat: 0.63 mg/dL (ref 0.50–1.10)
Glucose, Bld: 135 mg/dL — ABNORMAL HIGH (ref 70–99)
Potassium: 4.5 mEq/L (ref 3.5–5.3)
Sodium: 138 mEq/L (ref 135–145)

## 2013-08-03 LAB — VITAMIN B12: Vitamin B-12: 276 pg/mL (ref 211–911)

## 2013-08-03 LAB — TSH: TSH: 2.994 u[IU]/mL (ref 0.350–4.500)

## 2013-08-03 LAB — T4, FREE: Free T4: 1.41 ng/dL (ref 0.80–1.80)

## 2013-08-03 LAB — POCT GLYCOSYLATED HEMOGLOBIN (HGB A1C): Hemoglobin A1C: 6.8

## 2013-08-03 LAB — ALT: ALT: 12 U/L (ref 0–35)

## 2013-08-03 NOTE — Progress Notes (Signed)
S:  This 53 y.o. Cauc female is here for DM and thyroid disease follow-up as well as medication refills on all chronic meds. FSBS= 90-125; no reports of hypoglycemia. Medication compliance is excellent; no adverse affects reported.    Pt has tingling in fingertips of both hands; none in the feet. No hx of trauma, neck pain or discoloration of digits. She reports feet feel cold most of the time, worse in winter. 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  Pt awoke this morning w/ mild sore throat and sinus drainage. She heats home w/ a heat pump; partner prefers increased temp at night. 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 She denies fever/ chills, sinus pain or pressure, ear pain, rhinorrhea, cough or n/v/d.  Patient Active Problem List   Diagnosis Date Noted  . Tobacco user 10/21/2012  . Hyperlipidemia   . Hypertension   . Allergic rhinitis   . Hypothyroid   . DM type 2 (diabetes mellitus, type 2)    PMHx, Surg Hx, Soc and Fam Hx reviewed.  Medications reconciled.  ROS: As per HPI; negative for fatigue, appetite or unexpected weight change; fatigue, vision disturbances, CP or tightness, palpitations, SOB or DOE, skin changes or rashes, HA, dizziness, tremor, asymmetric numbness or weakness or syncope.  Filed Vitals:   08/03/13 0800  BP: 130/70  Pulse: 60  Temp: 97.8 F (36.6 C)  Resp: 16   GEN: In NAD; WN,WD. Weight is stable. HEENT: [place]/AT; EOMI w/ clear conj/sclerae. Bilateral hearing aids; TMs normal. Post ph clear w/o erythema or lesions. NT sinuses. NECK; Supple w/o LAN or TMG. COR: RRR. LUNGS: CTA; unlabored resp. SKIN: W&D; intact w/o diaphoresis or erythema. See DM Foot Exam. Some calloused areas on plantar aspects of feet. MS: MAEs; no deformities or muscle tenderness or atrophy. Hands- neurovascular intact. Grip is normal. NEURO: A&O x 3;  CNs intact. Nonfocal.   Results for orders placed in visit on 08/03/13  POCT GLYCOSYLATED HEMOGLOBIN (HGB A1C)      Result Value Range   Hemoglobin A1C 6.8      A/P:Type II or unspecified type diabetes mellitus without mention of complication, not stated as uncontrolled - Plan: HM Diabetes Foot Exam, Basic metabolic panel, POCT glycosylated hemoglobin (Hb A1C)  Hypothyroid - Stable on current medication dose.   Plan: TSH, T4, Free  Hyperlipidemia - Stable w/o medication adverse effects.  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 Advised specific type of wrist splints. Plan: Vitamin B12  Advised humidifier in home and OTC AYR saline nasal mist.  Will refill medications once all labs are resulted.

## 2013-08-03 NOTE — Patient Instructions (Signed)
I will authorize refills on all your medications once I have the lab results back. I expect your A1c to show that you have your Diabetes under very good control. The tingling in your fingertips is probably related to use of your hands because of the type of work you do. You may have early Carpel Tunnel Syndrome. Contact the office if the symptoms get worse.  Wrist splints can be helpful; there is a type called "Daytimer" that can help reduce wrist movement and reduce the swelling around the nerves that causes the discomfort.

## 2013-08-05 ENCOUNTER — Encounter: Payer: Self-pay | Admitting: Family Medicine

## 2013-09-07 ENCOUNTER — Ambulatory Visit (INDEPENDENT_AMBULATORY_CARE_PROVIDER_SITE_OTHER): Payer: BC Managed Care – PPO | Admitting: Physician Assistant

## 2013-09-07 VITALS — BP 120/62 | HR 80 | Temp 98.1°F | Resp 16 | Ht 64.0 in | Wt 120.0 lb

## 2013-09-07 DIAGNOSIS — E119 Type 2 diabetes mellitus without complications: Secondary | ICD-10-CM

## 2013-09-07 DIAGNOSIS — D72829 Elevated white blood cell count, unspecified: Secondary | ICD-10-CM

## 2013-09-07 DIAGNOSIS — F17200 Nicotine dependence, unspecified, uncomplicated: Secondary | ICD-10-CM

## 2013-09-07 DIAGNOSIS — L02519 Cutaneous abscess of unspecified hand: Secondary | ICD-10-CM

## 2013-09-07 DIAGNOSIS — L03119 Cellulitis of unspecified part of limb: Secondary | ICD-10-CM

## 2013-09-07 LAB — POCT CBC
Granulocyte percent: 76.8 %G (ref 37–80)
HCT, POC: 41.2 % (ref 37.7–47.9)
Hemoglobin: 12.6 g/dL (ref 12.2–16.2)
Lymph, poc: 2.7 (ref 0.6–3.4)
MCH, POC: 29.6 pg (ref 27–31.2)
MCHC: 30.6 g/dL — AB (ref 31.8–35.4)
MCV: 96.7 fL (ref 80–97)
MID (cbc): 0.8 (ref 0–0.9)
MPV: 8.8 fL (ref 0–99.8)
POC Granulocyte: 11.7 — AB (ref 2–6.9)
POC LYMPH PERCENT: 17.9 %L (ref 10–50)
POC MID %: 5.3 %M (ref 0–12)
Platelet Count, POC: 309 10*3/uL (ref 142–424)
RBC: 4.26 M/uL (ref 4.04–5.48)
RDW, POC: 14.2 %
WBC: 15.2 10*3/uL — AB (ref 4.6–10.2)

## 2013-09-07 LAB — GLUCOSE, POCT (MANUAL RESULT ENTRY): POC Glucose: 198 mg/dl — AB (ref 70–99)

## 2013-09-07 MED ORDER — CEFTRIAXONE SODIUM 1 G IJ SOLR
1.0000 g | Freq: Once | INTRAMUSCULAR | Status: AC
  Administered 2013-09-07: 1 g via INTRAMUSCULAR

## 2013-09-07 MED ORDER — CLINDAMYCIN HCL 300 MG PO CAPS: 300.0000 mg | ORAL_CAPSULE | Freq: Three times a day (TID) | ORAL | Status: DC

## 2013-09-07 NOTE — Progress Notes (Signed)
Subjective:    Patient ID: Carmen Anderson, female    DOB: 10/31/1960, 53 y.o.   MRN: [id number]  HPI Primary Physician: Dow AdolphMCPHERSON,BARBARA, MD  Chief Complaint: Abscess right hand x 1 day  HPI: 53 y.o. female with history below presents with 1 day history of abscess along the lateral aspect of the right hand. Lesion began as a "pimple" the previous day and progressed to a purulence filled abscess by the evening hours. She notes surrounding erythema and swelling this morning along the lateral aspect of the hand. She has a difficult time flexing the 5th digit secondary to this swelling. She has remained afebrile. She does not recall any injury or trauma. Nor does she recall anything biting her. She is a a tobacco smoker and is right handed.   She does have a history of DM type 2 with sugars running between 85 and 110.    Past Medical History  Diagnosis Date  . Diabetes mellitus   . Hyperlipidemia   . Hypertension   . DM type 2 (diabetes mellitus, type 2)   . Allergy   . Hypothyroidism      Home Meds: Prior to Admission medications   Medication Sig Start Date End Date Taking? Authorizing Provider  amitriptyline (ELAVIL) 10 MG tablet Take 1 tablet (10 mg total) by mouth at bedtime. 10/20/12  Yes Maurice MarchBarbara B McPherson, MD  atorvastatin (LIPITOR) 40 MG tablet Take 1 tablet (40 mg total) by mouth daily. 10/20/12  Yes Maurice MarchBarbara B McPherson, MD  fish oil-omega-3 fatty acids 1000 MG capsule Take 2 g by mouth daily.   Yes Historical Provider, MD  levothyroxine (SYNTHROID, LEVOTHROID) 50 MCG tablet Take 1 tablet (50 mcg total) by mouth daily. 10/20/12  Yes Maurice MarchBarbara B McPherson, MD  lisinopril (PRINIVIL,ZESTRIL) 5 MG tablet Take 1 tablet (5 mg total) by mouth daily. 10/20/12  Yes Maurice MarchBarbara B McPherson, MD  metFORMIN (GLUCOPHAGE-XR) 500 MG 24 hr tablet Take tablets twice daily with meals as directed. 10/20/12  Yes Maurice MarchBarbara B McPherson, MD    Allergies:  Allergies  Allergen Reactions  . Aspirin     Burns  stomach  . Codeine     rash    History   Social History  . Marital Status: Married    Spouse Name: N/A    Number of Children: N/A  . Years of Education: 12   Occupational History  . property asst. Production designer, theatre/television/filmmanager    Social History Main Topics  . Smoking status: Current Every Day Smoker -- 0.50 packs/day for 10 years    Types: Cigarettes  . Smokeless tobacco: Not on file     Comment: pt states thinking of quitting  . Alcohol Use: No  . Drug Use: No  . Sexual Activity: Yes    Birth Control/ Protection: None     Comment: 1 sexual partner in last year   Other Topics Concern  . Not on file   Social History Narrative   Exercise: walking, 4 times/week for 30 minutes.     Review of Systems     Objective:   Physical Exam  Physical Exam: Blood pressure 120/62, pulse 80, temperature 98.1 F (36.7 C), temperature source Oral, resp. rate 16, height 5\' 4"  (1.626 m), weight 120 lb (54.432 kg), SpO2 98.00%., Body mass index is 20.59 kg/(m^2). General: Well developed, well nourished, in no acute distress. Head: Normocephalic, atraumatic, eyes without discharge, sclera non-icteric, nares are without discharge.    Neck: Supple. Full ROM.  Lungs: Breathing  is unlabored. Heart: Regular rate. Msk:  Strength and tone normal for age. Extremities/Skin: Warm and dry. No clubbing or cyanosis. No edema. Right hand: 1 cm x 1 cm pustule along the distal 5th MCP. There is surrounding erythema that extends proximally to the wrist, distally just past the MCP joint, medially to the 3rd MCP. There is bilateral palmer erythema along the 5th MCP. There is STS along the same distributions. No axillary or epitrochlear nodes. No streaking.  Neuro: Alert and oriented X 3. Moves all extremities spontaneously. Gait is normal. CNII-XII grossly in tact. Psych:  Responds to questions appropriately with a normal affect.     Labs: Results for orders placed in visit on 09/07/13  POCT CBC      Result Value Ref Range    WBC 15.2 (*) 4.6 - 10.2 K/uL   Lymph, poc 2.7  0.6 - 3.4   POC LYMPH PERCENT 17.9  10 - 50 %L   MID (cbc) 0.8  0 - 0.9   POC MID % 5.3  0 - 12 %M   POC Granulocyte 11.7 (*) 2 - 6.9   Granulocyte percent 76.8  37 - 80 %G   RBC 4.26  4.04 - 5.48 M/uL   Hemoglobin 12.6  12.2 - 16.2 g/dL   HCT, POC 16.1  09.6 - 47.9 %   MCV 96.7  80 - 97 fL   MCH, POC 29.6  27 - 31.2 pg   MCHC 30.6 (*) 31.8 - 35.4 g/dL   RDW, POC 04.5     Platelet Count, POC 309  142 - 424 K/uL   MPV 8.8  0 - 99.8 fL  GLUCOSE, POCT (MANUAL RESULT ENTRY)      Result Value Ref Range   POC Glucose 198 (*) 70 - 99 mg/dl      PROCEDURE NOTE: Verbal consent obtained. Risks and benefits of the procedure were explained to the patient. Patient made an informed decision to proceed with the procedure. Betadine prep per usual protocol. Lesion unroofed with 10 blade.  Skin removed with iris scissors.  Culture taken. Dressed. Wound care instructions including precautions with patient. Patient tolerated the procedure well. Recheck in 48 hours.          Assessment & Plan:  53 year old female with abscess/cellulitis of the right hand, leukocytosis, DM type 2, and tobacco use.  -Wound unroofed per above -Rocephin 1 gram IM -Cleocin 300 mg 1 po tid #30 no RF -Await wound culture -Recheck 48 hours -Wound healing may be slowed secondary to her DM type 2 and tobacco use disorder  -Out of work until recheck secondary to allow for patient to rest, elevate, and apply warm compresses to hand -Discussed and examined by Dr. Hulen Skains, MHS, PA-C [facility] [address] [place], [place] [address] [phone] [facility] 09/07/2013 8:00 AM

## 2013-09-09 ENCOUNTER — Ambulatory Visit (INDEPENDENT_AMBULATORY_CARE_PROVIDER_SITE_OTHER): Payer: BC Managed Care – PPO | Admitting: Family Medicine

## 2013-09-09 VITALS — BP 112/66 | HR 67 | Temp 98.1°F | Resp 18 | Ht 64.0 in | Wt 118.0 lb

## 2013-09-09 DIAGNOSIS — L0390 Cellulitis, unspecified: Secondary | ICD-10-CM

## 2013-09-09 DIAGNOSIS — S61409A Unspecified open wound of unspecified hand, initial encounter: Secondary | ICD-10-CM

## 2013-09-09 DIAGNOSIS — L0291 Cutaneous abscess, unspecified: Secondary | ICD-10-CM

## 2013-09-09 LAB — WOUND CULTURE
Gram Stain: NONE SEEN
Gram Stain: NONE SEEN

## 2013-09-09 NOTE — Patient Instructions (Signed)
Good handwashing and keep the wound covered until it is well healed over. This was a MRSA infection.  Continue the clindamycin until you finish the course of antibiotics unless there are complications from the medicine arising.  Return at any time if things seem to be getting worse  People who have had MRSA may very well crop up with a boil somewhere else, and if you start getting another lesion at another time elsewhere come in very early.

## 2013-09-09 NOTE — Progress Notes (Signed)
Subjective: Patient is here for recheck with regard to the abscess and cellulitis of her right hand. It is improved.  Objective: 1 CM open wound which is starting to heal in. The surrounding erythema is almost gone. Culture did grow MRSA sensitive to clindamycin  Assessment: MRSA abscess and cellulitis, improved  Plan: Instructed her in the care. Followup as needed.

## 2013-10-07 ENCOUNTER — Other Ambulatory Visit: Payer: Self-pay | Admitting: Family Medicine

## 2013-12-07 ENCOUNTER — Ambulatory Visit: Payer: BC Managed Care – PPO | Admitting: Family Medicine

## 2013-12-13 ENCOUNTER — Other Ambulatory Visit: Payer: Self-pay | Admitting: Family Medicine

## 2013-12-14 ENCOUNTER — Telehealth: Payer: Self-pay

## 2013-12-14 ENCOUNTER — Ambulatory Visit (INDEPENDENT_AMBULATORY_CARE_PROVIDER_SITE_OTHER): Payer: BC Managed Care – PPO | Admitting: Family Medicine

## 2013-12-14 ENCOUNTER — Encounter: Payer: Self-pay | Admitting: Family Medicine

## 2013-12-14 VITALS — BP 110/62 | HR 57 | Temp 98.0°F | Resp 16 | Ht 63.75 in | Wt 119.4 lb

## 2013-12-14 DIAGNOSIS — E119 Type 2 diabetes mellitus without complications: Secondary | ICD-10-CM

## 2013-12-14 LAB — POCT GLYCOSYLATED HEMOGLOBIN (HGB A1C): Hemoglobin A1C: 7

## 2013-12-14 MED ORDER — METFORMIN HCL ER 500 MG PO TB24: ORAL_TABLET | ORAL | Status: DC

## 2013-12-14 MED ORDER — LEVOTHYROXINE SODIUM 50 MCG PO TABS: ORAL_TABLET | ORAL | Status: DC

## 2013-12-14 MED ORDER — LISINOPRIL 5 MG PO TABS: ORAL_TABLET | ORAL | Status: DC

## 2013-12-14 NOTE — Telephone Encounter (Signed)
Ok to RF Elavil?

## 2013-12-14 NOTE — Telephone Encounter (Signed)
Carmen Anderson needs to know how many tablets of Metformin Carmen Anderson is suppose to get a day. Please advise at [phone]

## 2013-12-14 NOTE — Telephone Encounter (Signed)
I refilled Elavil; this med was overlooked at OV today. Thanks.

## 2013-12-14 NOTE — Telephone Encounter (Signed)
I spoke with the pharmacist and clarified that pt should take 1 tab twice daily w/ meals. Quantity= 180 w/ 3 RFs.

## 2013-12-14 NOTE — Patient Instructions (Signed)
Women and Heart Disease Heart disease (HD) risk factors for both men and women are similar. Most studies addressing the diagnosis and treatment of heart disease have focused primarily on men. More research is now being done on women and heart disease.  GENDER DIFFERENCES  Symptoms of a heart attack for women may be more subtle, less typical and harder to identify.  Women are often slow to recognize heart disease risk factors and symptoms.  More women than men die from a heart attack before reaching a hospital.  Results of medical tests can vary by gender, especially electrocardiogram stress testing.  A common perception is that men are more likely to have heart problems. This is not true, especially in women after menopause.  Effects of estrogen, birth control and hormone therapy can have unique effects on the heart.  Women are more likely to be referred for a mental health evaluation of their symptoms. CAUSES Heart disease may be caused from conditions such as:  Buildup of fat-like deposits (plaques) in the blood vessels (coronary arteries) of the heart. The build up of fat-like deposits cause blockages that decrease the blood flow to the heart muscle.  Blockage or narrowing of the coronary arteries decreases oxygen to the heart muscle.  Abnormal heart rhythms or problems with the electrical system of the heart.  Heart muscle that has become enlarged or weak and cannot pump well.  Abnormal heart valves that either leak or are thickened and do not open and close properly.  Damage from infection or drugs.  Heart problems present at birth. RISK FACTORS  Family history.  Elevated blood lipid levels (cholesterol).  High blood pressure.  Diabetes.  Smoking.  Inactivity, lack of exercise.  Weighing 30% more than your ideal weight.  Age.  Past history of heart problems. SYMPTOMS  Chest discomfort or pressure, which may include the following:  Discomfort, fullness,  tightness, squeezing in center of chest that stays for a few minutes or comes and goes.  Discomfort or pressure that spreads to upper back, shoulders, neck, jaw or stomach.  Discomfort or tingling in the arms.  Profuse or clammy sweating.  Difficulty breathing.  Nausea.  Feeling your heart "flutter" or "jump."  Unexplained feelings of anxiety, fatigue or weakness.  Dizziness. DIAGNOSIS Diagnosis may include a test that:  Records the electrical activity of the heart and looks for changes (EKG [electrocardiogram]) .  Detects the presence of special proteins and enzymes that may show damage to the heart muscle (blood tests).  Looks at the blood flow through the heart and coronary arteries by using special dyes and X-rays (coronary angiography).  Uses sound waves to examine your heart valves, muscle function and blood flow within the heart (echo or echocardiogram).  Looks for symptoms as your heart works harder under stress (stress tests).  Creates images of the heart by detecting radiation following administration of a radioactive tracer (nuclear imaging).  Records the electrical activity of the heart and helps in detecting abnormalities of heart rhythm (electrophysiology).  Creates an image of the anatomy of the heart (CT heart scan). TREATMENT   Medications may be used to control your blood pressure, keep your heart beating regularly, reduce pain, and help your breathing.  If you are admitted to the hospital, the length of your stay depends on the amount of heart damage and any complications you may have.  Severe heart problems may require open heart surgery. This is a procedure where blocked coronary arteries are bypassed with a vein   from your leg.  If you have a single small coronary artery blockage and no heart damage, you may have a balloon angioplasty. This procedure uses stents that may help open the blockage and restore normal heart circulation. Stents are small,  wire, mesh-like tubes that help keep the artery open.  If needed, blood thinners may be used to dissolve clots. HOME CARE INSTRUCTIONS  Follow the treatment plan your caregiver prescribes.  Keep a list of every medicine you are taking. Keep it up to date and with you all the time.  Get help from your caregiver or pharmacist to learn the following about each medicine:  Why you are taking it.  What time of day to take it.  Possible side effects.  What foods to take with your medication and which foods to avoid.  When to stop taking your medication.  Try to maintain normal cholesterol levels.  Eat a heart healthy diet with salt and fat restrictions as advised. PREVENTION  You can reduce your risk of heart disease by doing the following:  Visit your caregiver regularly and determine whether you are at risk.  Quit smoking and keep away from those who smoke.  Get your blood pressure checked regularly and make sure it remains within normal limits.  Limit salt in your diet.  Maintain normal blood sugar and cholesterol levels.  Exercise regularly (walking or another form of aerobic activity, preferably 30 minutes continuously).  Maintain ideal body weight.  Reduce stress, anger, and depression.  If you have already had a heart attack, consult your caregiver about methods and medicines that may help in reducing your risk of having a second heart attack.  Be aware of the symptoms of heart disease and seek medical care if you develop these symptoms. SEEK IMMEDIATE MEDICAL CARE IF:  You have severe chest pain or the above symptoms, call your local emergency services (911 if in [place] THIS IS AN EMERGENCY. Do not wait to see if the pain will go away. DO NOT drive yourself to the hospital.  You notice increasing shortness of breath during rest, sleeping or with activity. Insist that providers take your complaints seriously and do a thorough heart evaluation. Document Released:  12/17/2007 Document Revised: 09/22/2011 Document Reviewed: 12/17/2007 [facility] Patient Information 2014 [place], [place].     Your A1c has gone up to 7.0%.  Work on good nutrition and staying active.  How to Increase Fiber in the Meal Plan for Diabetes Increasing fiber in the diet has many benefits including lowering blood cholesterol, helping to control blood glucose (sugar), preventing constipation, and aiding in weight management by helping you feel full longer. Start adding fiber to your diet slowly. A gradual substitution of high-fiber foods for low-fiber foods will allow the digestive tract to adjust. Most men under 87 years of age should aim to eat 38 g of fiber a day. Women should aim for 25 g. Over 35 years of age, most men need 30 g of fiber and most women need 21 g. Below are some suggestions for increasing fiber.  Try whole-wheat bread instead of white bread. Look for words high on the list of ingredients, such as whole wheat, whole rye, or whole oats.  Try a baked potato with skin instead of mashed potatoes.  Try a fresh apple with skin instead of applesauce.  Try a fresh orange instead of orange juice.  Try popcorn instead of potato chips.  Try bran cereal instead of corn flakes.  Try kidney, whole pinto,  or garbanzo beans instead of bread.  Try whole-grain crackers instead of saltine crackers.  Try whole-wheat pasta instead of regular varieties. While on a high-fiber diet:   Drink enough water and fluids to keep your urine clear or pale yellow.  Eat a variety of high fiber foods such as fruits, vegetables, whole grains, nuts, and seeds.  Aim for 5 servings of fruit and vegetables per day.  Try to increase your intake of fiber by eating high-fiber foods instead of taking fiber supplements that contain small amounts of fiber. There can be additional benefits for long-term health and blood glucose control with high-fiber foods.  SOURCES OF FIBER The following list  shows the average dietary fiber for types of food in the various food groups. Starches and Breads / Dietary Fiber (g)  Whole-grain bread, 1 slice / 2 g  Whole-grain cereals,  cup / 3 g  Bran cereals,  to  cup / 8 g  Starchy vegetables,  cup / 3 g  Oatmeal,  cup / 2 g  Whole-wheat pasta,  cup / 2 g  Brown rice,  cup / 2 g  Barley,  cup / 3 g Legumes / Dietary Fiber (g)  Beans,  cup / 8 g  Peas,  cup / 8 g  Lentils ,  cup / 8 g Meat and Meat Substitutes / Dietary Fiber (g) This group averages 0 grams of fiber. Exceptions are:  Nuts, seeds, 1 oz or  cup / 3 g  Chunky peanut butter, 2 tbs / 3 g Vegetables / Dietary Fiber (g)  Cooked vegetables,  to  cup / 2 to 3 g  Raw vegetables, 1 to 2 cups / 2 to 3 g Fruit / Dietary Fiber (g)  Raw or cooked fruit,  cup or 1 small, fresh piece / 2 g Milk / Dietary Fiber (g)  Milk, 1 cup or 8 oz / 0 g Fats and Oils / Dietary Fiber (g)  Fats and oils, 1 tsp / 0 g You can determine how much fiber you are eating by reading the Nutrition Facts panel on the labels of the foods you eat. FIBER IN SPECIFIC FOODS Cereals / Dietary Fiber (g)  All Bran,  cup / 9 g  All Bran with Extra Fiber,  cup / 13 g  Bran Flakes,  cup / 4 g  Cheerios,  cup / 1.5 g  Corn Bran,  cup / 4 g  Corn Flakes,  cup / 0.75 g  Cracklin' Oat Bran,  cup / 4 g  Fiber One,  cup / 13 g  Grape Nuts, 3 tbs / 3 g  Grape Nuts Flakes,  cup / 3 g  Noodles,  cup, cooked / 0.5 g  Nutrigrain Wheat,  cup / 3.5 g  Oatmeal,  cup, cooked / 1.1 g  Pasta, white (macaroni, spaghetti),  cup, cooked / 0.5 g  Pasta, whole-wheat (macaroni, spaghetti),  cup, cooked / 2 g  [place],  cup, cooked / 3 g  Rice, wild,  cup, cooked / 0.5 g  Rice, brown,  cup, cooked / 1 g  Rice, white,  cup, cooked / 0.2 g  Shredded Wheat, bite-sized,  cup / 2 g  Total,  cup / 1.75 g  Wheat Chex,  cup / 2.5 g  [place]heatena,  cup,  cooked / 4 g  Wheaties,  cup / 2.75 g Bread, Starchy Vegetables, and Dried Peas and Beans / Dietary Fiber (g)  Bagel, whole / 0.6 g  Baked beans in tomato sauce,  cup, cooked / 3 g  Bran muffin, 1 small / 2.5 g  Bread, cracked wheat, 1 slice / 2.5 g  Bread, pumpernickel, 1 slice / 2.5 g  Bread, white, 1 slice / 0.4 g  Bread, whole-wheat, 1 slice / 1.4 g  Corn,  cup, canned / 2.9 g  Kidney beans,  cup, cooked / 3.5 g  Lentils, cup, cooked / 3 g  Lima beans,  cup, cooked / 4 g  Navy beans,  cup, cooked / 4 g  Peas,  cup, cooked / 4 g  Popcorn, 3 cups popped, unbuttered / 3.5 g  Potato, baked (with skin), 1 small / 4 g  Potato, baked (without skin), 1 small / 2 g  Ry-Krisp, 4 crackers / 3 g  Saltine crackers, 6 squares / 0 g  Split peas,  cup, cooked / 2.5 g  Yams (sweet potato),  cup / 1.7 g Fruit / Dietary Fiber (g)  Apple, 1 small, fresh, with skin / 4 g  Apple juice,  cup / 0.4 g  Apricots, 4 medium, fresh / 4 g  Apricots, 7 halves, dried / 2 g  Banana,  medium / 1.2 g  Blueberries,  cup / 2 g  Cantaloupe, melon / 1.3 g  Cherries,  cup, canned / 1.4 g  Grapefruit,  medium / 1.6 g  Grapes, 15 small / 1.2 g  Grape juice,  cup / 0.5 g  Orange, 1 medium, fresh / 2 g  Orange juice,  cup / 0.5 g  Peach, 1 medium,fresh, with skin / 2 g  Pear, 1 medium, fresh, with skin / 4 g  Pineapple, cup, canned / 0.7 g  Plums, 2 whole / 2 g  Prunes, 3 whole / 1.5 g  Raspberries, 1 cup / 6 g  Strawberries, 1  cup / 4 g  Watermelon, 1  cup / 0.5 g Vegetables / Dietary Fiber (g)  Asparagus,  cup, cooked / 1 g  Beans, green and wax,  cup, cooked / 1.6 g  Beets,  cup, cooked / 1.8 g  Broccoli,  cup, cooked / 2.2 g  Brussels sprouts,  cup, cooked / 4 g  Cabbage,  cup, cooked / 2.5 g  Carrots,  cup, cooked / 2.3 g  Cauliflower,  cup, cooked / 1.1 g  Celery, 1 cup, raw / 1.5 g  Cucumber, 1 cup, raw / 0.8 g  Green  pepper,  cup sliced, cooked / 1.5 g  Lettuce, 1 cup, sliced / 0.9 g  Mushrooms, 1 cup sliced, raw / 1.8 g  Onion, 1 cup sliced, raw / 1.6 g  Spinach,  cup, cooked / 2.4 g  Tomato, 1 medium, fresh / 1.5 g  Tomato juice,  cup / 0 g  Zucchini,  cup, cooked / 1.8 g Document Released: 12/20/2001 Document Revised: 10/25/2012 Document Reviewed: 01/16/2009 [facility] Patient Information 2014 [place], [place].

## 2013-12-14 NOTE — Telephone Encounter (Signed)
Dr. Audria Nine  Is proper dosage 2 tabs twice daily? Please advise. Thank you.

## 2013-12-14 NOTE — Progress Notes (Signed)
S:  This 53 y.o. Cauc female is here for DM follow-up and medication refills. Medication compliance is good. FSBS have been normal; no reports of hypoglycemia. She admits increased intake of starchy foods and some fruits as well as artificial sweeteners. She denies Fatigue, diaphoresis, abnormal weight loss, vision disturbance, CP or tightness, palpitations, SOB or cough, edema, HA, dizziness, numbness, weakness or syncope.  HCM- Pt was referred for CRS but unable to keep appt due to work schedule.  Patient Active Problem List   Diagnosis Date Noted  . Tobacco user 10/21/2012  . Hyperlipidemia   . Hypertension   . Allergic rhinitis   . Hypothyroid   . DM type 2 (diabetes mellitus, type 2)    Prior to Admission medications   Medication Sig Start Date End Date Taking? Authorizing Provider  atorvastatin (LIPITOR) 40 MG tablet TAKE 1 TABLET (40 MG TOTAL) BY MOUTH DAILY. 10/07/13  Yes Maurice March, MD  fish oil-omega-3 fatty acids 1000 MG capsule Take 2 g by mouth daily.   Yes Historical Provider, MD  levothyroxine (SYNTHROID, LEVOTHROID) 50 MCG tablet TAKE 1 TABLET (50 MCG TOTAL) BY MOUTH DAILY.   Yes Maurice March, MD  lisinopril (PRINIVIL,ZESTRIL) 5 MG tablet TAKE 1 TABLET (5 MG TOTAL) BY MOUTH DAILY.   Yes Maurice March, MD  amitriptyline (ELAVIL) 10 MG tablet TAKE 1 TABLET (10 MG TOTAL) BY MOUTH AT BEDTIME.    Maurice March, MD  metFORMIN (GLUCOPHAGE-XR) 500 MG 24 hr tablet Take 1 tablet twice a day with meals as directed.    Maurice March, MD   PMHx, Surg Hx, Soc and Fam Hx reviewed.  ROS: As per HPI.  O: Filed Vitals:   12/14/13 0817  BP: 110/62  Pulse: 57  Temp: 98 F (36.7 C)  Resp: 16   GEN: In NAD; WN,WD. HENT: [place]/AT; EOMI w/ clear conj/sclerae. Otherwise unremarkable. COR: RRR. LUNGS: Normal resp rate and effort. SKIN: W&D; intact w/o diaphoresis, erythema.or pallor. Plantar aspect of feet- dry skin w/o ulcerations. MS: MAEs; no  deformities, c/c/e. NEURO: A&O x 3; CNs intact. Nonfocal.  See DM Foot Exam.  Results for orders placed in visit on 12/14/13  POCT GLYCOSYLATED HEMOGLOBIN (HGB A1C)      Result Value Ref Range   Hemoglobin A1C 7.0      A/P: Type II or unspecified type diabetes mellitus without mention of complication, not stated as uncontrolled - Increasing A1c- pt advised about nutritional modifications. Plan: POCT glycosylated hemoglobin (Hb A1C), HM Diabetes Foot Exam  Meds ordered this encounter  Medications  . levothyroxine (SYNTHROID, LEVOTHROID) 50 MCG tablet    Sig: TAKE 1 TABLET (50 MCG TOTAL) BY MOUTH DAILY.    Dispense:  90 tablets    Refill:  3  . lisinopril (PRINIVIL,ZESTRIL) 5 MG tablet    Sig: TAKE 1 TABLET (5 MG TOTAL) BY MOUTH DAILY.    Dispense:  90 tablets    Refill:  3  . metFORMIN (GLUCOPHAGE-XR) 500 MG 24 hr tablet    Sig: Take tablets twice daily with meals as directed.    Dispense:  180 tablets    Refill:  3

## 2014-03-02 ENCOUNTER — Telehealth: Payer: Self-pay | Admitting: *Deleted

## 2014-03-02 NOTE — Telephone Encounter (Signed)
Left msg on patients voicemail to call the ofc to schedule diabetes f/u with Dr. Audria NineMcPherson.

## 2014-04-11 ENCOUNTER — Other Ambulatory Visit: Payer: Self-pay

## 2014-04-11 DIAGNOSIS — Z1231 Encounter for screening mammogram for malignant neoplasm of breast: Secondary | ICD-10-CM

## 2014-04-28 ENCOUNTER — Encounter: Payer: Self-pay | Admitting: Family Medicine

## 2014-04-28 ENCOUNTER — Ambulatory Visit (INDEPENDENT_AMBULATORY_CARE_PROVIDER_SITE_OTHER): Payer: BC Managed Care – PPO | Admitting: Family Medicine

## 2014-04-28 VITALS — BP 120/76 | HR 63 | Temp 97.6°F | Resp 16 | Ht 64.0 in | Wt 120.8 lb

## 2014-04-28 DIAGNOSIS — E119 Type 2 diabetes mellitus without complications: Secondary | ICD-10-CM

## 2014-04-28 DIAGNOSIS — I10 Essential (primary) hypertension: Secondary | ICD-10-CM

## 2014-04-28 DIAGNOSIS — Z0000 Encounter for general adult medical examination without abnormal findings: Secondary | ICD-10-CM

## 2014-04-28 DIAGNOSIS — Z1211 Encounter for screening for malignant neoplasm of colon: Secondary | ICD-10-CM

## 2014-04-28 DIAGNOSIS — E039 Hypothyroidism, unspecified: Secondary | ICD-10-CM

## 2014-04-28 LAB — COMPLETE METABOLIC PANEL WITH GFR
ALT: 18 U/L (ref 0–35)
AST: 16 U/L (ref 0–37)
Albumin: 4.5 g/dL (ref 3.5–5.2)
Alkaline Phosphatase: 67 U/L (ref 39–117)
BUN: 11 mg/dL (ref 6–23)
CO2: 26 mEq/L (ref 19–32)
Calcium: 9.9 mg/dL (ref 8.4–10.5)
Chloride: 104 mEq/L (ref 96–112)
Creat: 0.75 mg/dL (ref 0.50–1.10)
GFR, Est African American: >89 mL/min
GFR, Est Non African American: >89 mL/min
Glucose, Bld: 146 mg/dL — ABNORMAL HIGH (ref 70–99)
Potassium: 4.2 mEq/L (ref 3.5–5.3)
Sodium: 139 mEq/L (ref 135–145)
Total Bilirubin: 0.4 mg/dL (ref 0.2–1.2)
Total Protein: 6.8 g/dL (ref 6.0–8.3)

## 2014-04-28 LAB — POCT GLYCOSYLATED HEMOGLOBIN (HGB A1C): Hemoglobin A1C: 6.7

## 2014-04-28 LAB — LIPID PANEL
Cholesterol: 144 mg/dL (ref 0–200)
HDL: 40 mg/dL (ref >39)
LDL Cholesterol: 78 mg/dL (ref 0–99)
Total CHOL/HDL Ratio: 3.6 Ratio
Triglycerides: 132 mg/dL (ref <150)
VLDL: 26 mg/dL (ref 0–40)

## 2014-04-28 LAB — TSH: TSH: 1.894 u[IU]/mL (ref 0.350–4.500)

## 2014-04-28 MED ORDER — AMITRIPTYLINE HCL 10 MG PO TABS: ORAL_TABLET | ORAL | Status: DC

## 2014-04-28 NOTE — Patient Instructions (Addendum)
Keeping You Healthy  Get These Tests  Blood Pressure- Have your blood pressure checked by your healthcare provider at least once a year.  Normal blood pressure is 120/80.  Weight- Have your body mass index (BMI) calculated to screen for obesity.  BMI is a measure of body fat based on height and weight.  You can calculate your own BMI at www.nhlbisupport.com/bmi/  Cholesterol- Have your cholesterol checked every year.  Diabetes- Have your blood sugar checked every year if you have high blood pressure, high cholesterol, a family history of diabetes or if you are overweight.  Pap Smear- Have a pap smear every 1 to 3 years if you have been sexually active.  If you are older than 65 and recent pap smears have been normal you may not need additional pap smears.  In addition, if you have had a hysterectomy  For benign disease additional pap smears are not necessary.  Mammogram-Yearly mammograms are essential for early detection of breast cancer  Screening for Colon Cancer- Colonoscopy starting at age 50. Screening may begin sooner depending on your family history and other health conditions.  Follow up colonoscopy as directed by your Gastroenterologist.  Screening for Osteoporosis- Screening begins at age 65 with bone density scanning, sooner if you are at higher risk for developing Osteoporosis.  Get these medicines  Calcium with Vitamin D- Your body requires 1200-1500 mg of Calcium a day and 800-1000 IU of Vitamin D a day.  You can only absorb 500 mg of Calcium at a time therefore Calcium must be taken in 2 or 3 separate doses throughout the day.  Hormones- Hormone therapy has been associated with increased risk for certain cancers and heart disease.  Talk to your healthcare provider about if you need relief from menopausal symptoms.  Aspirin- Ask your healthcare provider about taking Aspirin to prevent Heart Disease and Stroke.  Get these Immuniztions  Flu shot- Every fall  Pneumonia  shot- Once after the age of 65; if you are younger ask your healthcare provider if you need a pneumonia shot.  Tetanus- Every ten years.  Zostavax- Once after the age of 60 to prevent shingles.  Take these steps  Don't smoke- Your healthcare provider can help you quit. For tips on how to quit, ask your healthcare provider or go to www.smokefree.gov or call 1-800 QUIT-NOW.  Be physically active- Exercise 5 days a week for a minimum of 30 minutes.  If you are not already physically active, start slow and gradually work up to 30 minutes of moderate physical activity.  Try walking, dancing, bike riding, swimming, etc.  Eat a healthy diet- Eat a variety of healthy foods such as fruits, vegetables, whole grains, low fat milk, low fat cheeses, yogurt, lean meats, chicken, fish, eggs, dried beans, tofu, etc.  For more information go to www.thenutritionsource.org  Dental visit- Brush and floss teeth twice daily; visit your dentist twice a year.  Eye exam- Visit your Optometrist or Ophthalmologist yearly.  Drink alcohol in moderation- Limit alcohol intake to one drink or less a day.  Never drink and drive.  Depression- Your emotional health is as important as your physical health.  If you're feeling down or losing interest in things you normally enjoy, please talk to your healthcare provider.  Seat Belts- can save your life; always wear one  Smoke/Carbon Monoxide detectors- These detectors need to be installed on the appropriate level of your home.  Replace batteries at least once a year.  Violence- If anyone   is threatening or hurting you, please tell your healthcare provider.  Living Will/ Health care power of attorney- Discuss with your healthcare provider and family.        Mediterranean Diet  Why follow it? Research shows.   Those who follow the Mediterranean diet have a reduced risk of heart disease    The diet is associated with a reduced incidence of Parkinson's and Alzheimer's  diseases   People following the diet may have longer life expectancies and lower rates of chronic diseases    The Dietary Guidelines for Americans recommends the Mediterranean diet as an eating plan to promote health and prevent disease  What Is the Mediterranean Diet?    Healthy eating plan based on typical foods and recipes of Mediterranean-style cooking   The diet is primarily a plant based diet; these foods should make up a majority of meals   Starches - Plant based foods should make up a majority of meals - They are an important sources of vitamins, minerals, energy, antioxidants, and fiber - Choose whole grains, foods high in fiber and minimally processed items  - Typical grain sources include wheat, oats, barley, corn, brown rice, bulgar, farro, millet, polenta, couscous  - Various types of beans include chickpeas, lentils, fava beans, black beans, white beans   Fruits  Veggies - Large quantities of antioxidant rich fruits & veggies; 6 or more servings  - Vegetables can be eaten raw or lightly drizzled with oil and cooked  - Vegetables common to the traditional Mediterranean Diet include: artichokes, arugula, beets, broccoli, brussel sprouts, cabbage, carrots, celery, collard greens, cucumbers, eggplant, kale, leeks, lemons, lettuce, mushrooms, okra, onions, peas, peppers, potatoes, pumpkin, radishes, rutabaga, shallots, spinach, sweet potatoes, turnips, zucchini - Fruits common to the Mediterranean Diet include: apples, apricots, avocados, cherries, clementines, dates, figs, grapefruits, grapes, melons, nectarines, oranges, peaches, pears, pomegranates, strawberries, tangerines  Fats - Replace butter and margarine with healthy oils, such as olive oil, canola oil, and tahini  - Limit nuts to no more than a handful a day  - Nuts include walnuts, almonds, pecans, pistachios, pine nuts  - Limit or avoid candied, honey roasted or heavily salted nuts - Olives are central to the [employer] - can be eaten whole or used in a variety of dishes   Meats Protein - Limiting red meat: no more than a few times a month - When eating red meat: choose lean cuts and keep the portion to the size of deck of cards - Eggs: approx. 0 to 4 times a week  - Fish and lean poultry: at least 2 a week  - Healthy protein sources include, chicken, [place], lean beef, lamb - Increase intake of seafood such as tuna, salmon, trout, mackerel, shrimp, scallops - Avoid or limit high fat processed meats such as sausage and bacon  Dairy - Include moderate amounts of low fat dairy products  - Focus on healthy dairy such as fat free yogurt, skim milk, low or reduced fat cheese - Limit dairy products higher in fat such as whole or 2% milk, cheese, ice cream  Alcohol - Moderate amounts of red wine is ok  - No more than 5 oz daily for women (all ages) and men older than age 58  - No more than 10 oz of wine daily for men younger than 46  Other - Limit sweets and other desserts  - Use herbs and spices instead of salt to flavor foods  - Herbs and spices common  to the traditional Mediterranean Diet include: basil, bay leaves, chives, cloves, cumin, fennel, garlic, lavender, marjoram, mint, oregano, parsley, pepper, rosemary, sage, savory, sumac, tarragon, thyme   It's not just a diet, it's a lifestyle:    The Mediterranean diet includes lifestyle factors typical of those in the region    Foods, drinks and meals are best eaten with others and savored   Daily physical activity is important for overall good health   This could be strenuous exercise like running and aerobics   This could also be more leisurely activities such as walking, housework, yard-work, or taking the stairs   Moderation is the key; a balanced and healthy diet accommodates most foods and drinks   Consider portion sizes and frequency of consumption of certain foods   Meal Ideas & Options:    Breakfast:  o Whole wheat toast or whole wheat English  muffins with peanut butter & hard boiled egg o Steel cut oats topped with apples & cinnamon and skim milk  o Fresh fruit: banana, strawberries, melon, berries, peaches  o Smoothies: strawberries, bananas, greek yogurt, peanut butter o Low fat greek yogurt with blueberries and granola  o Egg white omelet with spinach and mushrooms o Breakfast couscous: whole wheat couscous, apricots, skim milk, cranberries    Sandwiches:  o Hummus and grilled vegetables (peppers, zucchini, squash) on whole wheat bread   o Grilled chicken on whole wheat pita with lettuce, tomatoes, cucumbers or tzatziki  o Tuna salad on whole wheat bread: tuna salad made with greek yogurt, olives, red peppers, capers, green onions o Garlic rosemary lamb pita: lamb sauted with garlic, rosemary, salt & pepper; add lettuce, cucumber, greek yogurt to pita - flavor with lemon juice and black pepper    Seafood:  o Mediterranean grilled salmon, seasoned with garlic, basil, parsley, lemon juice and black pepper o Shrimp, lemon, and spinach whole-grain pasta salad made with low fat greek yogurt  o Seared scallops with lemon orzo  o Seared tuna steaks seasoned salt, pepper, coriander topped with tomato mixture of olives, tomatoes, olive oil, minced garlic, parsley, green onions and cappers    Meats:  o Herbed greek chicken salad with kalamata olives, cucumber, feta  o Red bell peppers stuffed with spinach, bulgur, lean ground beef (or lentils) & topped with feta   o Kebabs: skewers of chicken, tomatoes, onions, zucchini, squash  o [place] burgers: made with red onions, mint, dill, lemon juice, feta cheese topped with roasted red peppers   Vegetarian o Cucumber salad: cucumbers, artichoke hearts, celery, red onion, feta cheese, tossed in olive oil & lemon juice  o Hummus and whole grain pita points with a greek salad (lettuce, tomato, feta, olives, cucumbers, red onion) o Lentil soup with celery, carrots made with vegetable broth,  garlic, salt and pepper  o Tabouli salad: parsley, bulgur, mint, scallions, cucumbers, tomato, radishes, lemon juice, olive oil, salt and pepper.    You have refills on all medications except for amitriptyline. Your pharmacy can contact our clinic when refills are due.

## 2014-04-28 NOTE — Progress Notes (Signed)
Subjective:    Patient ID: Carmen Anderson, female    DOB: January 05, 1961, 53 y.o.   MRN: [id number]  HPI  This 53 y.o. Cauc female is here for annual CPE; she has well controlled HTN and Type II DM as well as hypothyroidism. She is compliant w/ all medications and does monitor BS; tries to follow DM meal planning. Work schedule very busy and often pt misses snacks. Flu vaccine was given at work. Recent change at work >> increased responsibilities.  HCM: PAP- NA; s/p TAH in 1995.           MMG- Current; sch Nov 2015.           IMM- Current.           CRS- Not yet; difficult to get time off from work.             Patient Active Problem List   Diagnosis Date Noted  . Tobacco user 10/21/2012  . Hyperlipidemia   . Hypertension   . Allergic rhinitis   . Hypothyroid   . DM type 2 (diabetes mellitus, type 2)     Prior to Admission medications   Medication Sig Start Date End Date Taking? Authorizing Provider  amitriptyline (ELAVIL) 10 MG tablet TAKE 1 TABLET (10 MG TOTAL) BY MOUTH AT BEDTIME.   Yes Maurice March, MD  atorvastatin (LIPITOR) 40 MG tablet TAKE 1 TABLET (40 MG TOTAL) BY MOUTH DAILY. 10/07/13  Yes Maurice March, MD  fish oil-omega-3 fatty acids 1000 MG capsule Take 2 g by mouth daily.   Yes Historical Provider, MD  levothyroxine (SYNTHROID, LEVOTHROID) 50 MCG tablet TAKE 1 TABLET (50 MCG TOTAL) BY MOUTH DAILY. 12/14/13  Yes Maurice March, MD  lisinopril (PRINIVIL,ZESTRIL) 5 MG tablet TAKE 1 TABLET (5 MG TOTAL) BY MOUTH DAILY. 12/14/13  Yes Maurice March, MD  metFORMIN (GLUCOPHAGE-XR) 500 MG 24 hr tablet Take 1 tablet twice a day with meals as directed. 12/14/13  Yes Maurice March, MD    History   Social History  . Marital Status: Married    Spouse Name: N/A    Number of Children: N/A  . Years of Education: 12   Occupational History  . property asst. Production designer, theatre/television/film    Social History Main Topics  . Smoking status: Current Every Day Smoker -- 0.50 packs/day  for 10 years    Types: Cigarettes  . Smokeless tobacco: Not on file     Comment: pt states thinking of quitting  . Alcohol Use: No  . Drug Use: No  . Sexual Activity: Yes    Birth Control/ Protection: None     Comment: 1 sexual partner in last year   Other Topics Concern  . Not on file   Social History Narrative   Exercise: walking, 4 times/week for 30 minutes.    Family History  Problem Relation Age of Onset  . Diabetes Mother   . Heart disease Mother   . Stroke Father     2011  . Diabetes Sister   . Heart disease Sister   . Heart disease Brother     congenital     Review of Systems  Constitutional: Negative.   HENT: Negative.   Eyes: Positive for visual disturbance.       Vision works eval this year- pt has "floaters"  Respiratory: Negative.   Cardiovascular: Negative.   Gastrointestinal: Negative.   Endocrine: Negative.        Has occasional  FSBS < 50 due to missed meals or snacks  Genitourinary: Negative.   Musculoskeletal: Positive for arthralgias.       Hand joints- mild discomfort.  Skin: Negative.   Allergic/Immunologic: Negative.   Neurological: Negative.   Hematological: Negative.   Psychiatric/Behavioral: Negative.        Objective:   Physical Exam  Nursing note and vitals reviewed. Constitutional: She is oriented to person, place, and time. Vital signs are normal. She appears well-developed and well-nourished. No distress.  HENT:  Head: Normocephalic and atraumatic.  Right Ear: External ear and ear canal normal. Tympanic membrane is scarred. Decreased hearing is noted.  Left Ear: External ear and ear canal normal. Tympanic membrane is scarred. Decreased hearing is noted.  Nose: Nose normal. No mucosal edema, nasal deformity or septal deviation.  Mouth/Throat: Uvula is midline, oropharynx is clear and moist and mucous membranes are normal. No oral lesions. Normal dentition. No posterior oropharyngeal erythema.  Bilateral hearing aids.  Eyes:  Conjunctivae, EOM and lids are normal. Pupils are equal, round, and reactive to light. No scleral icterus.  Neck: Trachea normal, normal range of motion, full passive range of motion without pain and phonation normal. Neck supple. No JVD present. No spinous process tenderness and no muscular tenderness present. No mass and no thyromegaly present.  Cardiovascular: Normal rate, regular rhythm, S1 normal, S2 normal, normal heart sounds and normal pulses.   No extrasystoles are present. PMI is not displaced.  Exam reveals no gallop and no friction rub.   No murmur heard. Pulmonary/Chest: Effort normal and breath sounds normal. No respiratory distress. Right breast exhibits no inverted nipple, no mass, no nipple discharge, no skin change and no tenderness. Left breast exhibits no inverted nipple, no mass, no nipple discharge, no skin change and no tenderness. Breasts are symmetrical.  Abdominal: Soft. Normal appearance, normal aorta and bowel sounds are normal. She exhibits no distension and no mass. There is no hepatosplenomegaly. There is no tenderness. There is no guarding and no CVA tenderness.  Genitourinary:  Deferred.  Musculoskeletal:       Cervical back: Normal.       Thoracic back: Normal.       Lumbar back: Normal.       Right hand: She exhibits deformity. Normal sensation noted. Normal strength noted.       Left hand: Normal. Normal strength noted.  Mild deformity of MCP joints; good ROM. Remainder of exam unremarkable.  Lymphadenopathy:       Head (right side): No submental, no submandibular, no tonsillar, no preauricular, no posterior auricular and no occipital adenopathy present.       Head (left side): No submental, no submandibular, no tonsillar, no preauricular, no posterior auricular and no occipital adenopathy present.    She has no cervical adenopathy.    She has no axillary adenopathy.       Right: No inguinal and no supraclavicular adenopathy present.       Left: No inguinal  and no supraclavicular adenopathy present.  Neurological: She is alert and oriented to person, place, and time. She has normal strength. She displays no atrophy and no tremor. No cranial nerve deficit or sensory deficit. She exhibits normal muscle tone. Coordination and gait normal. She displays no Babinski's sign on the right side. She displays no Babinski's sign on the left side.  Reflex Scores:      Tricep reflexes are 1+ on the right side and 1+ on the left side.  Bicep reflexes are 1+ on the right side and 1+ on the left side.      Brachioradialis reflexes are 1+ on the right side and 1+ on the left side.      Patellar reflexes are 1+ on the right side and 1+ on the left side. Skin: Skin is warm, dry and intact. No ecchymosis and no rash noted. She is not diaphoretic. No cyanosis or erythema. No pallor. Nails show no clubbing.  Psychiatric: She has a normal mood and affect. Her speech is normal and behavior is normal. Judgment and thought content normal. Cognition and memory are normal.    Results for orders placed in visit on 04/28/14  POCT GLYCOSYLATED HEMOGLOBIN (HGB A1C)      Result Value Ref Range   Hemoglobin A1C 6.7         Assessment & Plan:  Encounter for routine history and physical exam in female - No new problems identified. Continue all current medications.    Plan: Lipid panel  Hypothyroidism, unspecified hypothyroidism type - Stable on current medication; no dose change pending lab result.  Plan: TSH  Type 2 diabetes mellitus without complication - Stable; advised regular meals and snacks to avoid hypoglycemia.         Plan: POCT glycosylated hemoglobin (Hb A1C)  Essential hypertension, benign - Continue ACEI (Lisinopril). Plan: COMPLETE METABOLIC PANEL WITH GFR, Lipid panel  Special screening for malignant neoplasms, colon - Plan: POCT occult blood stool, device   Meds ordered this encounter  Medications  . amitriptyline (ELAVIL) 10 MG tablet    Sig: TAKE 1  TABLET (10 MG TOTAL) BY MOUTH AT BEDTIME.    Dispense:  90 tablet    Refill:  3

## 2014-05-03 LAB — HEMOCCULT GUIAC POC 1CARD (OFFICE)
Card #2 Fecal Occult Blod, POC: NEGATIVE
Fecal Occult Blood, POC: NEGATIVE

## 2014-05-03 NOTE — Addendum Note (Signed)
Addended by: Gerrianne ScalePAYNE, Trinidad Ingle L on: 05/03/2014 04:17 PM   Modules accepted: Orders

## 2014-05-03 NOTE — Addendum Note (Signed)
Addended by: Gerrianne ScalePAYNE, Dajae Kizer L on: 05/03/2014 04:18 PM   Modules accepted: Orders

## 2014-05-24 ENCOUNTER — Ambulatory Visit
Admission: RE | Admit: 2014-05-24 | Discharge: 2014-05-24 | Disposition: A | Discharge status: Home / Self Care | Payer: BC Managed Care – PPO | Source: Ambulatory Visit

## 2014-05-24 DIAGNOSIS — Z1231 Encounter for screening mammogram for malignant neoplasm of breast: Secondary | ICD-10-CM

## 2014-07-21 ENCOUNTER — Other Ambulatory Visit: Payer: Self-pay | Admitting: Family Medicine

## 2014-10-27 ENCOUNTER — Encounter: Payer: Self-pay | Admitting: Family Medicine

## 2014-10-27 ENCOUNTER — Ambulatory Visit (INDEPENDENT_AMBULATORY_CARE_PROVIDER_SITE_OTHER): Payer: BLUE CROSS/BLUE SHIELD | Admitting: Family Medicine

## 2014-10-27 VITALS — BP 123/71 | HR 68 | Temp 97.7°F | Resp 16 | Ht 64.0 in | Wt 119.6 lb

## 2014-10-27 DIAGNOSIS — E038 Other specified hypothyroidism: Secondary | ICD-10-CM | POA: Diagnosis not present

## 2014-10-27 DIAGNOSIS — E034 Atrophy of thyroid (acquired): Secondary | ICD-10-CM | POA: Diagnosis not present

## 2014-10-27 DIAGNOSIS — I10 Essential (primary) hypertension: Secondary | ICD-10-CM

## 2014-10-27 DIAGNOSIS — E785 Hyperlipidemia, unspecified: Secondary | ICD-10-CM | POA: Diagnosis not present

## 2014-10-27 DIAGNOSIS — E119 Type 2 diabetes mellitus without complications: Secondary | ICD-10-CM | POA: Diagnosis not present

## 2014-10-27 LAB — LIPID PANEL
Cholesterol: 212 mg/dL — ABNORMAL HIGH (ref 0–200)
HDL: 38 mg/dL — ABNORMAL LOW (ref >=46)
LDL Cholesterol: 147 mg/dL — ABNORMAL HIGH (ref 0–99)
Total CHOL/HDL Ratio: 5.6 Ratio
Triglycerides: 135 mg/dL (ref <150)
VLDL: 27 mg/dL (ref 0–40)

## 2014-10-27 LAB — THYROID PANEL WITH TSH
Free Thyroxine Index: 2.9 (ref 1.4–3.8)
T3 Uptake: 30 % (ref 22–35)
T4, Total: 9.5 ug/dL (ref 4.5–12.0)
TSH: 1.412 u[IU]/mL (ref 0.350–4.500)

## 2014-10-27 LAB — BASIC METABOLIC PANEL
BUN: 10 mg/dL (ref 6–23)
CO2: 26 mEq/L (ref 19–32)
Calcium: 9.6 mg/dL (ref 8.4–10.5)
Chloride: 105 mEq/L (ref 96–112)
Creat: 0.67 mg/dL (ref 0.50–1.10)
Glucose, Bld: 155 mg/dL — ABNORMAL HIGH (ref 70–99)
Potassium: 5 mEq/L (ref 3.5–5.3)
Sodium: 140 mEq/L (ref 135–145)

## 2014-10-27 LAB — ALT: ALT: 13 U/L (ref 0–35)

## 2014-10-27 LAB — POCT GLYCOSYLATED HEMOGLOBIN (HGB A1C): Hemoglobin A1C: 7

## 2014-10-27 MED ORDER — ATORVASTATIN CALCIUM 40 MG PO TABS: ORAL_TABLET | ORAL | Status: DC

## 2014-10-27 NOTE — Patient Instructions (Signed)
You are doing well with your health care and self care!  Continue all current medications. Try to increase your physical activity to get your A1c back down and keep it under 7%. You should call back at the end of August top schedule an appointment with one of the PA-C (ask for a female, if that is your preference).  It has been a pleasure being your physician and a part of your healthcare team and I wish you all the best in the years to come.

## 2014-10-31 ENCOUNTER — Encounter: Payer: Self-pay | Admitting: Family Medicine

## 2014-10-31 NOTE — Progress Notes (Signed)
Subjective:    Patient ID: Carmen Anderson, female    DOB: [birth year], 54 y.o.   MRN: [id number]  HPI This 54 y.o. Female has Type II DM , well controlled on metformin 500 mg 1 tablet bid with meals. She has not been able to exercise through the winter months and job stress was increased w/ responsibilities >> unhealthy food choices. FSBS: 130-150, "higher than usual".  HTN- Well controlled on current medication. No adverse medication effects. Pt denies fatigue, diaphoresis, vision disturbances, CP or tightness, palpitations, SOB or DOE, edema, HA, dizziness, numbness, weakness or syncope.  Pt is compliant w/ atorvastatin 40 mg daily; no report of myalgias, back pain or arthralgias. Nutrition as noted above.  Hypothyroidism is stable; pt denies abnormal weight gain or loss, fatigue, change in hair or skin, sleep disturbance, weakness or confusion/ mental sluggishness.  Patient Active Problem List   Diagnosis Date Noted  . Tobacco user 10/21/2012  . Hyperlipidemia   . Hypertension   . Allergic rhinitis   . Hypothyroid   . DM type 2 (diabetes mellitus, type 2)     Prior to Admission medications   Medication Sig Start Date End Date Taking? Authorizing Provider  amitriptyline (ELAVIL) 10 MG tablet TAKE 1 TABLET (10 MG TOTAL) BY MOUTH AT BEDTIME. 04/28/14  Yes Maurice March, MD  atorvastatin (LIPITOR) 40 MG tablet Take 1 tablet (40 mg total) by mouth daily   Yes Maurice March, MD  fish oil-omega-3 fatty acids 1000 MG capsule Take 2 g by mouth daily.   Yes Historical Provider, MD  levothyroxine (SYNTHROID, LEVOTHROID) 50 MCG tablet TAKE 1 TABLET (50 MCG TOTAL) BY MOUTH DAILY. 12/14/13  Yes Maurice March, MD  lisinopril (PRINIVIL,ZESTRIL) 5 MG tablet TAKE 1 TABLET (5 MG TOTAL) BY MOUTH DAILY. 12/14/13  Yes Maurice March, MD  metFORMIN (GLUCOPHAGE-XR) 500 MG 24 hr tablet Take 1 tablet twice a day with meals as directed. 12/14/13  Yes Maurice March, MD    SURG, SOC and  FAM HX reviewed.   Review of Systems  As per HPI.     Objective:   Physical Exam  Constitutional: She is oriented to person, place, and time. Vital signs are normal. She appears well-developed and well-nourished. No distress.  Blood pressure 123/71, pulse 68, temperature 97.7 F (36.5 C), temperature source Oral, resp. rate 16, height  (1.626 m), weight 119 lb 9.6 oz (54.25 kg), SpO2 100 %.   HENT:  Head: Normocephalic and atraumatic.  Right Ear: External ear normal.  Left Ear: External ear normal.  Nose: Nose normal.  Mouth/Throat: Oropharynx is clear and moist.  Eyes: Conjunctivae and EOM are normal. No scleral icterus.  Neck: Normal range of motion. Neck supple. No thyromegaly present.  Cardiovascular: Normal rate, regular rhythm and normal heart sounds.  Exam reveals no gallop and no friction rub.   No murmur heard. Pulmonary/Chest: Effort normal and breath sounds normal. No respiratory distress.  Musculoskeletal: Normal range of motion. She exhibits no edema or tenderness.  Lymphadenopathy:    She has no cervical adenopathy.  Neurological: She is alert and oriented to person, place, and time. No cranial nerve deficit. Coordination normal.  Skin: Skin is warm and dry. She is not diaphoretic. No erythema. No pallor.  Psychiatric: She has a normal mood and affect. Her behavior is normal. Judgment and thought content normal.  Nursing note and vitals reviewed.   A1c= 7.0%     Assessment & Plan:  Type 2 diabetes mellitus without complication - Stable but encouraged pt to resume more nutritious choices and increase physical activity. Plan: [facility] Diabetes Foot Exam, Lipid panel, POCT glycosylated hemoglobin (Hb A1C)  Essential hypertension - Stable and controlled on current medication. Plan: Basic metabolic panel  Hyperlipidemia - Atorvastatin well tolerated. Plan: ALT, lipid panel.  Hypothyroidism due to acquired atrophy of thyroid - Continue dose of levothyroxine 50 mg  pending lab result. Plan: Thyroid Panel With TSH  Meds ordered this encounter  Medications  . atorvastatin (LIPITOR) 40 MG tablet    Sig: Take 1 tablet (40 mg total) by mouth daily    Dispense:  90 tablet    Refill:  1

## 2014-12-15 ENCOUNTER — Other Ambulatory Visit: Payer: Self-pay | Admitting: Family Medicine

## 2015-01-19 ENCOUNTER — Other Ambulatory Visit: Payer: Self-pay | Admitting: Family Medicine

## 2015-03-13 ENCOUNTER — Other Ambulatory Visit: Payer: Self-pay | Admitting: Physician Assistant

## 2015-03-20 ENCOUNTER — Other Ambulatory Visit: Payer: Self-pay | Admitting: Physician Assistant

## 2015-04-30 ENCOUNTER — Encounter: Payer: BLUE CROSS/BLUE SHIELD | Admitting: Physician Assistant

## 2015-05-08 ENCOUNTER — Other Ambulatory Visit: Payer: Self-pay

## 2015-05-08 MED ORDER — ATORVASTATIN CALCIUM 40 MG PO TABS: ORAL_TABLET | ORAL | Status: DC

## 2015-05-09 ENCOUNTER — Encounter: Payer: Self-pay | Admitting: Family Medicine

## 2015-05-09 ENCOUNTER — Ambulatory Visit (INDEPENDENT_AMBULATORY_CARE_PROVIDER_SITE_OTHER): Payer: BLUE CROSS/BLUE SHIELD | Admitting: Family Medicine

## 2015-05-09 ENCOUNTER — Telehealth: Payer: Self-pay

## 2015-05-09 VITALS — BP 138/62 | HR 67 | Temp 97.8°F | Resp 16 | Ht 64.0 in | Wt 123.4 lb

## 2015-05-09 DIAGNOSIS — E034 Atrophy of thyroid (acquired): Secondary | ICD-10-CM | POA: Diagnosis not present

## 2015-05-09 DIAGNOSIS — E7800 Pure hypercholesterolemia, unspecified: Secondary | ICD-10-CM

## 2015-05-09 DIAGNOSIS — E1140 Type 2 diabetes mellitus with diabetic neuropathy, unspecified: Secondary | ICD-10-CM | POA: Diagnosis not present

## 2015-05-09 DIAGNOSIS — Z0000 Encounter for general adult medical examination without abnormal findings: Secondary | ICD-10-CM | POA: Diagnosis not present

## 2015-05-09 DIAGNOSIS — I10 Essential (primary) hypertension: Secondary | ICD-10-CM | POA: Diagnosis not present

## 2015-05-09 DIAGNOSIS — E038 Other specified hypothyroidism: Secondary | ICD-10-CM | POA: Diagnosis not present

## 2015-05-09 LAB — CBC
HCT: 42 % (ref 36.0–46.0)
Hemoglobin: 14.1 g/dL (ref 12.0–15.0)
MCH: 30 pg (ref 26.0–34.0)
MCHC: 33.6 g/dL (ref 30.0–36.0)
MCV: 89.4 fL (ref 78.0–100.0)
MPV: 9.5 fL (ref 8.6–12.4)
Platelets: 338 10*3/uL (ref 150–400)
RBC: 4.7 MIL/uL (ref 3.87–5.11)
RDW: 12.5 % (ref 11.5–15.5)
WBC: 11.1 10*3/uL — ABNORMAL HIGH (ref 4.0–10.5)

## 2015-05-09 LAB — POCT URINALYSIS DIP (MANUAL ENTRY)
Bilirubin, UA: NEGATIVE
Blood, UA: NEGATIVE
Glucose, UA: NEGATIVE
Ketones, POC UA: NEGATIVE
Leukocytes, UA: NEGATIVE
Nitrite, UA: NEGATIVE
Protein Ur, POC: NEGATIVE
Spec Grav, UA: 1.01
Urobilinogen, UA: 0.2
pH, UA: 6

## 2015-05-09 LAB — LIPID PANEL
Cholesterol: 171 mg/dL (ref 125–200)
HDL: 37 mg/dL — ABNORMAL LOW (ref >=46)
LDL Cholesterol: 93 mg/dL (ref <130)
Total CHOL/HDL Ratio: 4.6 Ratio (ref <=5.0)
Triglycerides: 204 mg/dL — ABNORMAL HIGH (ref <150)
VLDL: 41 mg/dL — ABNORMAL HIGH (ref <30)

## 2015-05-09 LAB — COMPREHENSIVE METABOLIC PANEL
ALT: 18 U/L (ref 6–29)
AST: 16 U/L (ref 10–35)
Albumin: 4.7 g/dL (ref 3.6–5.1)
Alkaline Phosphatase: 77 U/L (ref 33–130)
BUN: 11 mg/dL (ref 7–25)
CO2: 23 mmol/L (ref 20–31)
Calcium: 9.7 mg/dL (ref 8.6–10.4)
Chloride: 104 mmol/L (ref 98–110)
Creat: 0.72 mg/dL (ref 0.50–1.05)
Glucose, Bld: 161 mg/dL — ABNORMAL HIGH (ref 65–99)
Potassium: 4.4 mmol/L (ref 3.5–5.3)
Sodium: 139 mmol/L (ref 135–146)
Total Bilirubin: 0.5 mg/dL (ref 0.2–1.2)
Total Protein: 7.2 g/dL (ref 6.1–8.1)

## 2015-05-09 LAB — HEMOGLOBIN A1C
Hgb A1c MFr Bld: 7.7 % — ABNORMAL HIGH (ref <5.7)
Mean Plasma Glucose: 174 mg/dL — ABNORMAL HIGH (ref <117)

## 2015-05-09 LAB — TSH: TSH: 3.006 u[IU]/mL (ref 0.350–4.500)

## 2015-05-09 MED ORDER — LISINOPRIL 5 MG PO TABS: ORAL_TABLET | ORAL | Status: DC

## 2015-05-09 MED ORDER — ATORVASTATIN CALCIUM 40 MG PO TABS: ORAL_TABLET | ORAL | Status: DC

## 2015-05-09 MED ORDER — GABAPENTIN 100 MG PO CAPS: 100.0000 mg | ORAL_CAPSULE | Freq: Every day | ORAL | Status: DC

## 2015-05-09 MED ORDER — LEVOTHYROXINE SODIUM 50 MCG PO TABS: ORAL_TABLET | ORAL | Status: DC

## 2015-05-09 MED ORDER — METFORMIN HCL ER 500 MG PO TB24: ORAL_TABLET | ORAL | Status: DC

## 2015-05-09 NOTE — Telephone Encounter (Signed)
Pharm reqs RF of atorvastatin. Debbie, it looks like pt was to have lipids panel repeated in 6 mos, and is here to see you today. Please RF her medication at appropriate dosage.

## 2015-05-09 NOTE — Progress Notes (Signed)
Subjective:    Patient ID: Carmen Anderson, female    DOB: 14-Jun-1961, 54 y.o.   MRN: [id number]  HPI This is a pleasant 54 yo female who presents today for CPE. She works as a Investment banker, corporate. She is married without children. She has a 93 yo dog.   Last CPE- 10/15 Mammo- 11/15, has appointment set for next month Pap- hysterectomy Colonoscopy- will schedule after the first of the year Tdap-2014 Flu- had Eye- will go in next month Dental- regular Exercise- has a very active job  She has had elevated cholesterol in the past and has watched her diet since last visit. Blood sugars at home run 90s- 120s.   Past Medical History  Diagnosis Date  . Diabetes mellitus   . Hyperlipidemia   . Hypertension   . DM type 2 (diabetes mellitus, type 2) (HCC)   . Allergy   . Hypothyroidism    Past Surgical History  Procedure Laterality Date  . Abdominal hysterectomy  1995  . Stapedectomy      Right ear   Family History  Problem Relation Age of Onset  . Diabetes Mother   . Heart disease Mother   . Stroke Father     2011  . Hyperlipidemia Father   . Diabetes Sister   . Heart disease Sister   . Heart disease Brother     congenital    Review of Systems  Constitutional: Negative.   Eyes: Negative.   Respiratory: Negative.   Cardiovascular: Negative.   Gastrointestinal: Negative.   Endocrine: Negative.   Genitourinary: Negative.   Musculoskeletal:       Bilateral pain in feet, bothers her at night mostly when she has had a busy day.   Skin: Negative.   Allergic/Immunologic: Negative.   Neurological: Negative.   Hematological: Negative.   Psychiatric/Behavioral: Negative.       Objective:   Physical Exam Physical Exam  Constitutional: She is oriented to person, place, and time. She appears well-developed and well-nourished. No distress.  HENT:  Head: Normocephalic and atraumatic.  Right Ear: External ear normal.  Left Ear: External ear normal.  Nose: Nose normal.    Mouth/Throat: Oropharynx is clear and moist. No oropharyngeal exudate.  Eyes: Conjunctivae are normal. Pupils are equal, round, and reactive to light.  Neck: Normal range of motion. Neck supple. No JVD present. No thyromegaly present.  Cardiovascular: Normal rate, regular rhythm, normal heart sounds and intact distal pulses.   Pulmonary/Chest: Effort normal and breath sounds normal. Right breast exhibits no inverted nipple, no mass, no nipple discharge, no skin change and no tenderness. Left breast exhibits no inverted nipple, no mass, no nipple discharge, no skin change and no tenderness. Breasts are symmetrical.  Abdominal: Soft. Bowel sounds are normal. She exhibits no distension and no mass. There is no tenderness. There is no rebound and no guarding.  Genitourinary: Vagina normal. Pelvic exam was performed with patient supine. There is no rash, tenderness, lesion or injury on the right labia. There is no rash, tenderness, lesion or injury on the left labia. Cervix surgically absent. No vaginal discharge found.  Musculoskeletal: Normal range of motion. She exhibits no edema or tenderness.  Lymphadenopathy:    She has no cervical adenopathy.  Neurological: She is alert and oriented to person, place, and time. She has normal reflexes.  Skin: Skin is warm and dry. She is not diaphoretic.  Psychiatric: She has a normal mood and affect. Her behavior is normal. Judgment and thought  content normal.  Vitals reviewed.  BP 138/62 mmHg  Pulse 67  Temp(Src) 97.8 F (36.6 C) (Oral)  Resp 16  Ht 5\' 4"  (1.626 m)  Wt 123 lb 6.4 oz (55.974 kg)  BMI 21.17 kg/m2 Wt Readings from Last 3 Encounters:  05/09/15 123 lb 6.4 oz (55.974 kg)  10/27/14 119 lb 9.6 oz (54.25 kg)  04/28/14 120 lb 12.8 oz (54.795 kg)   Diabetic Foot Exam - Simple   Simple Foot Form  Diabetic Foot exam was performed with the following findings:  Yes 05/09/2015  9:53 AM  Visual Inspection  See comments:  Yes  Sensation Testing   Intact to touch and monofilament testing bilaterally:  Yes  Pulse Check  Posterior Tibialis and Dorsalis pulse intact bilaterally:  Yes  Comments  Patient with mild hammer toes bilaterally, some callous on medial aspects of great toes.      Depression screen [facility] 2/9 05/09/2015 04/28/2014 12/14/2013  Decreased Interest 0 0 0  Down, Depressed, Hopeless 0 0 0  PHQ - 2 Score 0 0 0      Assessment & Plan:  1. Annual physical exam  2. Essential hypertension, benign - lisinopril (PRINIVIL,ZESTRIL) 5 MG tablet; TAKE 1 TABLET BY MOUTH EVERY DAY  Dispense: 90 tablet; Refill: 0 - CBC - Comprehensive metabolic panel  3. Hypothyroidism due to acquired atrophy of thyroid - levothyroxine (SYNTHROID, LEVOTHROID) 50 MCG tablet; TAKE 1 TABLET BY MOUTH EVERY DAY  Dispense: 90 tablet; Refill: 0 - TSH  4. Type 2 diabetes mellitus with diabetic neuropathy, without long-term current use of insulin (HCC) - patient with neuropathy and inadequate pain control on low dose amitriptyline, will try low dose gabapentin. Discussed potential side effects and room for increased dosing if needed.  - gabapentin (NEURONTIN) 100 MG capsule; Take 1 capsule (100 mg total) by mouth at bedtime.  Dispense: 30 capsule; Refill: 3 - metFORMIN (GLUCOPHAGE-XR) 500 MG 24 hr tablet; TAKE 1 TABLET BY MOUTH TWICE DAILY WITH MEALS AS DIRECTED  Dispense: 180 tablet; Refill: 0 - POCT urinalysis dipstick - CBC - Comprehensive metabolic panel - Hemoglobin A1C  5. Hypercholesteremia - atorvastatin (LIPITOR) 40 MG tablet; Take 1 tablet (40 mg total) by mouth daily  Dispense: 30 tablet; Refill: 0 - Comprehensive metabolic panel - Lipid panel  - follow up in 4 months will refer for screening colonoscopy at that time  Olean Reeeborah Loraine Bhullar, FNP-BC  [facility], [facility]  05/09/2015 9:50 PM

## 2015-05-14 ENCOUNTER — Other Ambulatory Visit: Payer: Self-pay

## 2015-05-14 DIAGNOSIS — Z1231 Encounter for screening mammogram for malignant neoplasm of breast: Secondary | ICD-10-CM

## 2015-05-30 ENCOUNTER — Ambulatory Visit
Admission: RE | Admit: 2015-05-30 | Discharge: 2015-05-30 | Disposition: A | Discharge status: Home / Self Care | Payer: BLUE CROSS/BLUE SHIELD | Source: Ambulatory Visit

## 2015-05-30 DIAGNOSIS — Z1231 Encounter for screening mammogram for malignant neoplasm of breast: Secondary | ICD-10-CM

## 2015-06-01 ENCOUNTER — Other Ambulatory Visit: Payer: Self-pay | Admitting: Family Medicine

## 2015-06-01 DIAGNOSIS — R928 Other abnormal and inconclusive findings on diagnostic imaging of breast: Secondary | ICD-10-CM

## 2015-06-13 ENCOUNTER — Ambulatory Visit
Admission: RE | Admit: 2015-06-13 | Discharge: 2015-06-13 | Disposition: A | Discharge status: Home / Self Care | Payer: BLUE CROSS/BLUE SHIELD | Source: Ambulatory Visit | Attending: Family Medicine | Admitting: Family Medicine

## 2015-06-13 DIAGNOSIS — R928 Other abnormal and inconclusive findings on diagnostic imaging of breast: Secondary | ICD-10-CM

## 2015-06-18 NOTE — Telephone Encounter (Signed)
done

## 2015-06-26 ENCOUNTER — Other Ambulatory Visit: Payer: Self-pay | Admitting: Physician Assistant

## 2015-07-29 ENCOUNTER — Ambulatory Visit (INDEPENDENT_AMBULATORY_CARE_PROVIDER_SITE_OTHER): Payer: BLUE CROSS/BLUE SHIELD | Admitting: Emergency Medicine

## 2015-07-29 VITALS — BP 150/64 | HR 71 | Temp 99.2°F | Resp 18 | Ht 63.5 in | Wt 122.4 lb

## 2015-07-29 DIAGNOSIS — J0140 Acute pansinusitis, unspecified: Secondary | ICD-10-CM

## 2015-07-29 DIAGNOSIS — J209 Acute bronchitis, unspecified: Secondary | ICD-10-CM

## 2015-07-29 MED ORDER — HYDROCOD POLST-CPM POLST ER 10-8 MG/5ML PO SUER: 5.0000 mL | Freq: Two times a day (BID) | ORAL | Status: DC

## 2015-07-29 MED ORDER — AMOXICILLIN-POT CLAVULANATE 875-125 MG PO TABS: 1.0000 | ORAL_TABLET | Freq: Two times a day (BID) | ORAL | Status: DC

## 2015-07-29 NOTE — Progress Notes (Signed)
Subjective:  Patient ID: Carmen Anderson, female    DOB: 1961-06-30  Age: 55 y.o. MRN: [id number]  CC: Cough and Headache   HPI [name] presents  patient has a several day history of nasal congestion postnasal drainage is purulent character. She has pressure cheeks. Has some watery nasal drainage. She has no sore throat or ear pain. She has no fever but has chills. She has a cough that's productive purulent sputum.[name]  History Lashon has a past medical history of Diabetes mellitus; Hyperlipidemia; Hypertension; DM type 2 (diabetes mellitus, type 2) (HCC); Allergy; and Hypothyroidism.   She has past surgical history that includes Abdominal hysterectomy (1995) and Stapedectomy.   Her  family history includes Diabetes in her mother and sister; Heart disease in her brother, mother, and sister; Hyperlipidemia in her father; Stroke in her father.  She   reports that she has been smoking Cigarettes.  She has a 5 pack-year smoking history. She does not have any smokeless tobacco history on file. She reports that she does not drink alcohol or use illicit drugs.  Outpatient Prescriptions Prior to Visit  Medication Sig Dispense Refill  . atorvastatin (LIPITOR) 40 MG tablet Take 1 tablet (40 mg total) by mouth daily 30 tablet 0  . fish oil-omega-3 fatty acids 1000 MG capsule Take 2 g by mouth daily.    [name] gabapentin (NEURONTIN) 100 MG capsule Take 1 capsule (100 mg total) by mouth at bedtime. 30 capsule 3  . levothyroxine (SYNTHROID, LEVOTHROID) 50 MCG tablet TAKE 1 TABLET BY MOUTH EVERY DAY 90 tablet 0  . lisinopril (PRINIVIL,ZESTRIL) 5 MG tablet TAKE 1 TABLET BY MOUTH EVERY DAY 90 tablet 0  . metFORMIN (GLUCOPHAGE-XR) 500 MG 24 hr tablet TAKE 1 TABLET BY MOUTH TWICE DAILY WITH MEALS AS DIRECTED 180 tablet 0   No facility-administered medications prior to visit.    Social History   Social History  . Marital Status: Married    Spouse Name: N/A  . Number of Children: N/A  . Years of  Education: 12   Occupational History  . property asst. Production designer, theatre/television/film    Social History Main Topics  . Smoking status: Current Every Day Smoker -- 0.50 packs/day for 10 years    Types: Cigarettes  . Smokeless tobacco: None     Comment: pt states thinking of quitting  . Alcohol Use: No  . Drug Use: No  . Sexual Activity: Yes    Birth Control/ Protection: None     Comment: 1 sexual partner in last year   Other Topics Concern  . None   Social History Narrative   Exercise: walking, 4 times/week for 30 minutes.     Review of Systems  Constitutional: Positive for chills. Negative for fever and appetite change.  HENT: Positive for congestion, postnasal drip and sinus pressure. Negative for ear pain and sore throat.   Eyes: Negative for pain and redness.  Respiratory: Positive for cough. Negative for shortness of breath and wheezing.   Cardiovascular: Negative for leg swelling.  Gastrointestinal: Negative for nausea, vomiting, abdominal pain, diarrhea, constipation and blood in stool.  Endocrine: Negative for polyuria.  Genitourinary: Negative for dysuria, urgency, frequency and flank pain.  Musculoskeletal: Negative for gait problem.  Skin: Negative for rash.  Neurological: Negative for weakness and headaches.  Psychiatric/Behavioral: Negative for confusion and decreased concentration. The patient is not nervous/anxious.     Objective:  BP 150/64 mmHg  Pulse 71  Temp(Src) 99.2 F (37.3 C) (Oral)  Resp 18  Ht 5' 3.5" (1.613 m)  Wt 122 lb 6.4 oz (55.52 kg)  BMI 21.34 kg/m2  SpO2 97%  Physical Exam  Constitutional: She is oriented to person, place, and time. She appears well-developed and well-nourished. No distress.  HENT:  Head: Normocephalic and atraumatic.  Right Ear: External ear normal.  Left Ear: External ear normal.  Nose: Nose normal.  Eyes: Conjunctivae and EOM are normal. Pupils are equal, round, and reactive to light. No scleral icterus.  Neck: Normal range of  motion. Neck supple. No tracheal deviation present.  Cardiovascular: Normal rate, regular rhythm and normal heart sounds.   Pulmonary/Chest: Effort normal. No respiratory distress. She has no wheezes. She has no rales.  Abdominal: She exhibits no mass. There is no tenderness. There is no rebound and no guarding.  Musculoskeletal: She exhibits no edema.  Lymphadenopathy:    She has no cervical adenopathy.  Neurological: She is alert and oriented to person, place, and time. Coordination normal.  Skin: Skin is warm and dry. No rash noted.  Psychiatric: She has a normal mood and affect. Her behavior is normal.      Assessment & Plan:   Albin FellingCarla was seen today for cough and headache.  Diagnoses and all orders for this visit:  Acute bronchitis, unspecified organism  Acute pansinusitis, recurrence not specified  Other orders -     amoxicillin-clavulanate (AUGMENTIN) 875-125 MG tablet; Take 1 tablet by mouth 2 (two) times daily. -     chlorpheniramine-HYDROcodone (TUSSIONEX PENNKINETIC ER) 10-8 MG/5ML SUER; Take 5 mLs by mouth 2 (two) times daily.  I am having Ms. Edenfield start on amoxicillin-clavulanate and chlorpheniramine-HYDROcodone. I am also having her maintain her fish oil-omega-3 fatty acids, gabapentin, atorvastatin, levothyroxine, lisinopril, and metFORMIN.  Meds ordered this encounter  Medications  . amoxicillin-clavulanate (AUGMENTIN) 875-125 MG tablet    Sig: Take 1 tablet by mouth 2 (two) times daily.    Dispense:  20 tablet    Refill:  0  . chlorpheniramine-HYDROcodone (TUSSIONEX PENNKINETIC ER) 10-8 MG/5ML SUER    Sig: Take 5 mLs by mouth 2 (two) times daily.    Dispense:  60 mL    Refill:  0    Appropriate red flag conditions were discussed with the patient as well as actions that should be taken.  Patient expressed his understanding.  Follow-up: Return if symptoms worsen or fail to improve.  Carmelina DaneAnderson, Ladarrious Kirksey S, MD

## 2015-07-29 NOTE — Patient Instructions (Signed)

## 2015-08-25 ENCOUNTER — Other Ambulatory Visit: Payer: Self-pay | Admitting: Family Medicine

## 2015-09-05 ENCOUNTER — Ambulatory Visit (INDEPENDENT_AMBULATORY_CARE_PROVIDER_SITE_OTHER): Payer: BLUE CROSS/BLUE SHIELD | Admitting: Family Medicine

## 2015-09-05 ENCOUNTER — Encounter: Payer: Self-pay | Admitting: Family Medicine

## 2015-09-05 VITALS — BP 123/69 | HR 61 | Temp 97.8°F | Resp 16 | Ht 64.0 in | Wt 117.0 lb

## 2015-09-05 DIAGNOSIS — F17200 Nicotine dependence, unspecified, uncomplicated: Secondary | ICD-10-CM | POA: Diagnosis not present

## 2015-09-05 DIAGNOSIS — E038 Other specified hypothyroidism: Secondary | ICD-10-CM

## 2015-09-05 DIAGNOSIS — E7800 Pure hypercholesterolemia, unspecified: Secondary | ICD-10-CM | POA: Diagnosis not present

## 2015-09-05 DIAGNOSIS — E1140 Type 2 diabetes mellitus with diabetic neuropathy, unspecified: Secondary | ICD-10-CM

## 2015-09-05 DIAGNOSIS — E034 Atrophy of thyroid (acquired): Secondary | ICD-10-CM | POA: Diagnosis not present

## 2015-09-05 DIAGNOSIS — I10 Essential (primary) hypertension: Secondary | ICD-10-CM | POA: Diagnosis not present

## 2015-09-05 LAB — BASIC METABOLIC PANEL
BUN: 10 mg/dL (ref 7–25)
CO2: 23 mmol/L (ref 20–31)
Calcium: 9.4 mg/dL (ref 8.6–10.4)
Chloride: 104 mmol/L (ref 98–110)
Creat: 0.7 mg/dL (ref 0.50–1.05)
Glucose, Bld: 165 mg/dL — ABNORMAL HIGH (ref 65–99)
Potassium: 4.7 mmol/L (ref 3.5–5.3)
Sodium: 138 mmol/L (ref 135–146)

## 2015-09-05 LAB — HEMOGLOBIN A1C
Hgb A1c MFr Bld: 7.9 % — ABNORMAL HIGH (ref <5.7)
Mean Plasma Glucose: 180 mg/dL — ABNORMAL HIGH (ref <117)

## 2015-09-05 MED ORDER — LISINOPRIL 5 MG PO TABS: 5.0000 mg | ORAL_TABLET | Freq: Every day | ORAL | Status: DC

## 2015-09-05 MED ORDER — LEVOTHYROXINE SODIUM 50 MCG PO TABS
50.0000 ug | ORAL_TABLET | Freq: Every day | ORAL | Status: DC
Start: 2015-09-05 — End: 2016-07-10

## 2015-09-05 MED ORDER — METFORMIN HCL ER 500 MG PO TB24: ORAL_TABLET | ORAL | Status: DC

## 2015-09-05 MED ORDER — ATORVASTATIN CALCIUM 40 MG PO TABS: ORAL_TABLET | ORAL | Status: DC

## 2015-09-05 MED ORDER — GABAPENTIN 100 MG PO CAPS: 100.0000 mg | ORAL_CAPSULE | Freq: Every day | ORAL | Status: DC

## 2015-09-05 NOTE — Progress Notes (Signed)
Subjective:    Patient ID: Carmen Anderson, female    DOB: 05-03-1961, 55 y.o.   MRN: [id number]  HPI This is a pleasant 55 yo female who presents today for follow up of DM type 2, neuropathy, HTN. She is having less pain with nightly gabapentin. Feels that pain is manageable and does not feel like she needs to increase gabapentin.  Has been watching her diet more carefully. Blood sugars at home running 60-140. She skipped lunch one day and had blood sugar of 60. Has decreased her tobacco use to about 6 cigarettes a day. Her job is stressful and she recently had car problems and dental problems.   Past Medical History  Diagnosis Date  . Diabetes mellitus   . Hyperlipidemia   . Hypertension   . DM type 2 (diabetes mellitus, type 2) (HCC)   . Allergy   . Hypothyroidism    Past Surgical History  Procedure Laterality Date  . Abdominal hysterectomy  1995  . Stapedectomy      Right ear   Family History  Problem Relation Age of Onset  . Diabetes Mother   . Heart disease Mother   . Stroke Father     2011  . Hyperlipidemia Father   . Diabetes Sister   . Heart disease Sister   . Heart disease Brother     congenital      Review of Systems No chest pain, no SOB, no palpitations    Objective:   Physical Exam Physical Exam  Constitutional: Oriented to person, place, and time. She appears well-developed and well-nourished, thin.  HENT:  Head: Normocephalic and atraumatic.  Eyes: Conjunctivae are normal.  Neck: Normal range of motion. Neck supple.  Cardiovascular: Normal rate, regular rhythm and normal heart sounds.   Pulmonary/Chest: Effort normal and breath sounds normal.  Musculoskeletal: Normal range of motion.  Neurological: Alert and oriented to person, place, and time.  Skin: Skin is warm and dry.  Psychiatric: Normal mood and affect. Behavior is normal. Judgment and thought content normal.  Vitals reviewed.  BP 123/69 mmHg  Pulse 61  Temp(Src) 97.8 F (36.6 C)   Resp 16  Ht  (1.626 m)  Wt 117 lb (53.071 kg)  BMI 20.07 kg/m2 Wt Readings from Last 3 Encounters:  09/05/15 117 lb (53.071 kg)  07/29/15 122 lb 6.4 oz (55.52 kg)  05/09/15 123 lb 6.4 oz (55.974 kg)      Assessment & Plan:  1. Type 2 diabetes mellitus with diabetic neuropathy, without long-term current use of insulin (HCC) - Hemoglobin A1c - Basic metabolic panel - gabapentin (NEURONTIN) 100 MG capsule; Take 1 capsule (100 mg total) by mouth at bedtime.  Dispense: 90 capsule; Refill: 1 - metFORMIN (GLUCOPHAGE-XR) 500 MG 24 hr tablet; TAKE 1 TABLET BY MOUTH TWICE DAILY WITH MEALS AS DIRECTED  Dispense: 180 tablet; Refill: 1  2. Essential hypertension - Basic metabolic panel - lisinopril (PRINIVIL,ZESTRIL) 5 MG tablet; Take 1 tablet (5 mg total) by mouth daily.  Dispense: 90 tablet; Refill: 1  3. Tobacco use disorder - encouraged her to continue to decrease her tobacco use  4. Hypercholesteremia - atorvastatin (LIPITOR) 40 MG tablet; Take 1 tablet (40 mg total) by mouth daily  Dispense: 90 tablet; Refill: 1  5. Hypothyroidism due to acquired atrophy of thyroid - levothyroxine (SYNTHROID, LEVOTHROID) 50 MCG tablet; Take 1 tablet (50 mcg total) by mouth daily.  Dispense: 90 tablet; Refill: 1 - follow up in 3 months  Gavin Pound  Leone Payor, FNP-BC  [facility], [facility]  09/05/2015 8:27 AM

## 2015-09-05 NOTE — Patient Instructions (Signed)
Keep up the good work with your diet and decreasing your tobacco use!

## 2015-09-07 NOTE — Addendum Note (Signed)
Addended by: Olean Ree B on: 09/07/2015 08:05 PM   Modules accepted: Orders

## 2015-09-25 ENCOUNTER — Ambulatory Visit (INDEPENDENT_AMBULATORY_CARE_PROVIDER_SITE_OTHER): Payer: BLUE CROSS/BLUE SHIELD | Admitting: Internal Medicine

## 2015-09-25 ENCOUNTER — Encounter: Payer: Self-pay | Admitting: Internal Medicine

## 2015-09-25 VITALS — BP 120/64 | HR 68 | Temp 98.5°F | Resp 12 | Ht 64.0 in | Wt 120.8 lb

## 2015-09-25 DIAGNOSIS — E1142 Type 2 diabetes mellitus with diabetic polyneuropathy: Secondary | ICD-10-CM | POA: Diagnosis not present

## 2015-09-25 DIAGNOSIS — E1165 Type 2 diabetes mellitus with hyperglycemia: Secondary | ICD-10-CM | POA: Diagnosis not present

## 2015-09-25 DIAGNOSIS — E1140 Type 2 diabetes mellitus with diabetic neuropathy, unspecified: Secondary | ICD-10-CM | POA: Insufficient documentation

## 2015-09-25 MED ORDER — SITAGLIPTIN PHOSPHATE 100 MG PO TABS: 100.0000 mg | ORAL_TABLET | Freq: Every day | ORAL | Status: DC

## 2015-09-25 NOTE — Patient Instructions (Signed)
Please continue Metformin ER 500 mg 2x a day with meals.  Add Januvia 100 mg in am, before b'fast.  Please let me know if the sugars are consistently <80 or >200.  Please return in 2 months with your sugar log.   PATIENT INSTRUCTIONS FOR TYPE 2 DIABETES:  **Please join MyChart!** - see attached instructions about how to join if you have not done so already.  DIET AND EXERCISE Diet and exercise is an important part of diabetic treatment.  We recommended aerobic exercise in the form of brisk walking (working between 40-60% of maximal aerobic capacity, similar to brisk walking) for 150 minutes per week (such as 30 minutes five days per week) along with 3 times per week performing 'resistance' training (using various gauge rubber tubes with handles) 5-10 exercises involving the major muscle groups (upper body, lower body and core) performing 10-15 repetitions (or near fatigue) each exercise. Start at half the above goal but build slowly to reach the above goals. If limited by weight, joint pain, or disability, we recommend daily walking in a swimming pool with water up to waist to reduce pressure from joints while allow for adequate exercise.    BLOOD GLUCOSES Monitoring your blood glucoses is important for continued management of your diabetes. Please check your blood glucoses 2-4 times a day: fasting, before meals and at bedtime (you can rotate these measurements - e.g. one day check before the 3 meals, the next day check before 2 of the meals and before bedtime, etc.).   HYPOGLYCEMIA (low blood sugar) Hypoglycemia is usually a reaction to not eating, exercising, or taking too much insulin/ other diabetes drugs.  Symptoms include tremors, sweating, hunger, confusion, headache, etc. Treat IMMEDIATELY with 15 grams of Carbs: . 4 glucose tablets .  cup regular juice/soda . 2 tablespoons raisins . 4 teaspoons sugar . 1 tablespoon honey Recheck blood glucose in 15 mins and repeat above if still  symptomatic/blood glucose <100.  RECOMMENDATIONS TO REDUCE YOUR RISK OF DIABETIC COMPLICATIONS: * Take your prescribed MEDICATION(S) * Follow a DIABETIC diet: Complex carbs, fiber rich foods, (monounsaturated and polyunsaturated) fats * AVOID saturated/trans fats, high fat foods, >2,300 mg salt per day. * EXERCISE at least 5 times a week for 30 minutes or preferably daily.  * DO NOT SMOKE OR DRINK more than 1 drink a day. * Check your FEET every day. Do not wear tightfitting shoes. Contact us if you develop an ulcer * See your EYE doctor once a year or more if needed * Get a FLU shot once a year * Get a PNEUMONIA vaccine once before and once after age 54 years  GOALS:  * Your Hemoglobin A1c of <7%  * fasting sugars need to be <130 * after meals sugars need to be <180 (2h after you start eating) * Your Systolic BP should be 140 or lower  * Your Diastolic BP should be 80 or lower  * Your HDL (Good Cholesterol) should be 40 or higher  * Your LDL (Bad Cholesterol) should be 100 or lower. * Your Triglycerides should be 150 or lower  * Your Urine microalbumin (kidney function) should be <30 * Your Body Mass Index should be 25 or lower    Please consider the following ways to cut down carbs and fat and increase fiber and micronutrients in your diet: - substitute whole grain for white bread or pasta - substitute brown rice for white rice - substitute 90-calorie flat bread pieces for slices of bread  when possible - substitute sweet potatoes or yams for white potatoes - substitute humus for margarine - substitute tofu for cheese when possible - substitute almond or rice milk for regular milk (would not drink soy milk daily due to concern for soy estrogen influence on breast cancer risk) - substitute dark chocolate for other sweets when possible - substitute water - can add lemon or orange slices for taste - for diet sodas (artificial sweeteners will trick your body that you can eat sweets  without getting calories and will lead you to overeating and weight gain in the long run) - do not skip breakfast or other meals (this will slow down the metabolism and will result in more weight gain over time)  - can try smoothies made from fruit and almond/rice milk in am instead of regular breakfast - can also try old-fashioned (not instant) oatmeal made with almond/rice milk in am - order the dressing on the side when eating salad at a restaurant (pour less than half of the dressing on the salad) - eat as little meat as possible - can try juicing, but should not forget that juicing will get rid of the fiber, so would alternate with eating raw veg./fruits or drinking smoothies - use as little oil as possible, even when using olive oil - can dress a salad with a mix of balsamic vinegar and lemon juice, for e.g. - use agave nectar, stevia sugar, or regular sugar rather than artificial sweateners - steam or broil/roast veggies  - snack on veggies/fruit/nuts (unsalted, preferably) when possible, rather than processed foods - reduce or eliminate aspartame in diet (it is in diet sodas, chewing gum, etc) Read the labels!  Try to read Dr. Janene Harvey book: "Program for Reversing Diabetes" for other ideas for healthy eating.

## 2015-09-25 NOTE — Progress Notes (Signed)
Patient ID: Carmen Anderson, female   DOB: 04/15/1961, 55 y.o.   MRN: [id number]  HPI: Carmen Anderson is a 55 y.o.-year-old female, referred by her PCP, Olean Reeeborah Gessner, NP, for management of DM2, dx in 2001-2002, non-insulin-dependent, uncontrolled, with complications (PN).  Last hemoglobin A1c was: Lab Results  Component Value Date   HGBA1C 7.9* 09/05/2015   HGBA1C 7.7* 05/09/2015   HGBA1C 7.0 10/27/2014   Pt is on a regimen of: - Metformin ER 500 mg 2x a day, with meals (decreased from 3x a day ~last Spring)  Pt checks her sugars 2x a day and they are: - am: 80-120 - 2h after b'fast: n/c - before lunch: n/c - 2h after lunch: n/c - before dinner: 100-140 - 2h after dinner: n/c - bedtime: n/c - nighttime: n/c Very rare lows. Lowest sugar was 38 1 mo ago b/c skips meals; she has hypoglycemia awareness at 3050s.  Highest sugar was 169.  Glucometer: FreeStyle  Pt's meals are: - Breakfast: PB sandwich on wheatbread or wheat muffin w/ 1 egg - Lunch: salads or [place]key sandwich on wheat bread - Dinner: soups, salad, meat 2x a weekly + veggies - Snacks: pineapple, celery  Exercises by walking daily.  She is more active at work - in last 4 years.  - no CKD, last BUN/creatinine:  Lab Results  Component Value Date   BUN 10 09/05/2015   CREATININE 0.70 09/05/2015  On Lisinopril. - last set of lipids: Lab Results  Component Value Date   CHOL 171 05/09/2015   HDL 37* 05/09/2015   LDLCALC 93 05/09/2015   TRIG 204* 05/09/2015   CHOLHDL 4.6 05/09/2015  On Lipitor. On Fish oil. - last eye exam was in 07/2015 - [facility]. No DR.  - + numbness and tingling in her feet. On Neurontin 100 mg capsule at bedtime.  Pt has FH of DM in mother and older sister.  She has HTN, HL, hypothyroidism (on LT4 50 mcg)  ROS: Constitutional: no weight gain/loss, + fatigue, + subjective hypothermia Eyes: no blurry vision, no xerophthalmia ENT: no sore throat, no nodules palpated in throat,  no dysphagia/odynophagia, no hoarseness Cardiovascular: no CP/SOB/palpitations/leg swelling Respiratory: no cough/SOB Gastrointestinal: no N/V/D/C Musculoskeletal: no muscle/joint aches Skin: no rashes Neurological: no tremors/numbness/tingling/dizziness Psychiatric: no depression/anxiety  Past Medical History  Diagnosis Date  . Diabetes mellitus   . Hyperlipidemia   . Hypertension   . DM type 2 (diabetes mellitus, type 2) (HCC)   . Allergy   . Hypothyroidism    Past Surgical History  Procedure Laterality Date  . Abdominal hysterectomy  1995  . Stapedectomy      Right ear   Social History   Social History  . Marital Status: Married    Spouse Name: N/A  . Number of Children: 0  . Years of Education: 12   Occupational History  . property asst. Production designer, theatre/television/filmmanager    Social History Main Topics  . Smoking status: Current Every Day Smoker -- 0.50 packs/day for 10 years    Types: Cigarettes  . Smokeless tobacco: Not on file     Comment: pt states thinking of quitting  . Alcohol Use: No  . Drug Use: No   Social History Narrative   Exercise: walking, 4 times/week for 30 minutes.   Current Outpatient Prescriptions on File Prior to Visit  Medication Sig Dispense Refill  . atorvastatin (LIPITOR) 40 MG tablet Take 1 tablet (40 mg total) by mouth daily 90 tablet 1  .  fish oil-omega-3 fatty acids 1000 MG capsule Take 2 g by mouth daily.    [name] gabapentin (NEURONTIN) 100 MG capsule Take 1 capsule (100 mg total) by mouth at bedtime. 90 capsule 1  . levothyroxine (SYNTHROID, LEVOTHROID) 50 MCG tablet Take 1 tablet (50 mcg total) by mouth daily. 90 tablet 1  . lisinopril (PRINIVIL,ZESTRIL) 5 MG tablet Take 1 tablet (5 mg total) by mouth daily. 90 tablet 1  . metFORMIN (GLUCOPHAGE-XR) 500 MG 24 hr tablet TAKE 1 TABLET BY MOUTH TWICE DAILY WITH MEALS AS DIRECTED 180 tablet 1   No current facility-administered medications on file prior to visit.   Allergies  Allergen Reactions  . Aspirin      Burns stomach  . Codeine     rash   Family History  Problem Relation Age of Onset  . Diabetes Mother   . Heart disease Mother   . Stroke Father     2011  . Hyperlipidemia Father   . Diabetes Sister   . Heart disease Sister   . Heart disease Brother     congenital   PE: BP 120/64 mmHg  Pulse 68  Temp(Src) 98.5 F (36.9 C) (Oral)  Resp 12  Ht  (1.626 m)  Wt 120 lb 12.8 oz (54.795 kg)  BMI 20.73 kg/m2  SpO2 97% Wt Readings from Last 3 Encounters:  09/25/15 120 lb 12.8 oz (54.795 kg)  09/05/15 117 lb (53.071 kg)  07/29/15 122 lb 6.4 oz (55.52 kg)   Constitutional: normal weight, in NAD Eyes: PERRLA, EOMI, no exophthalmos ENT: moist mucous membranes, no thyromegaly, no cervical lymphadenopathy Cardiovascular: RRR, No MRG Respiratory: CTA B Gastrointestinal: abdomen soft, NT, ND, BS+ Musculoskeletal: no deformities, strength intact in all 4 Skin: moist, warm, no rashes Neurological: no tremor with outstretched hands, DTR normal in all 4  ASSESSMENT: 1. DM2, non-insulin-dependent, uncontrolled, with complications - PN  PLAN:  1. Patient with long-standing, mild diabetes, on oral antidiabetic regimen, which became insufficient after she decreased the metformin dose from 3 to 2 tabs a day (stomach discomfort). Sugars are great in am and increase during the day. She had some low CBGs when she skipped meals >> will add Januvia which should not cause hypoglycemia but still cover her meals. - I suggested to:  Patient Instructions  Please continue Metformin ER 500 mg 2x a day with meals.  Add Januvia 100 mg in am, before b'fast.  Please let me know if the sugars are consistently <80 or >200.  Please return in 2 months with your sugar log.   - Strongly advised her to start checking sugars at different times of the day - check 1-2 times a day, rotating checks - given sugar log and advised how to fill it and to bring it at next appt  - given foot care handout and  explained the principles  - given instructions for hypoglycemia management "15-15 rule"  - advised for yearly eye exams  - Return to clinic in 2 mo with sugar log >> will check Hba1c then

## 2015-12-03 ENCOUNTER — Other Ambulatory Visit (INDEPENDENT_AMBULATORY_CARE_PROVIDER_SITE_OTHER): Payer: BLUE CROSS/BLUE SHIELD | Admitting: *Deleted

## 2015-12-03 ENCOUNTER — Ambulatory Visit (INDEPENDENT_AMBULATORY_CARE_PROVIDER_SITE_OTHER): Payer: BLUE CROSS/BLUE SHIELD | Admitting: Internal Medicine

## 2015-12-03 ENCOUNTER — Encounter: Payer: Self-pay | Admitting: Internal Medicine

## 2015-12-03 VITALS — BP 112/70 | HR 70 | Wt 121.0 lb

## 2015-12-03 DIAGNOSIS — E1165 Type 2 diabetes mellitus with hyperglycemia: Secondary | ICD-10-CM

## 2015-12-03 DIAGNOSIS — E1142 Type 2 diabetes mellitus with diabetic polyneuropathy: Secondary | ICD-10-CM

## 2015-12-03 LAB — POCT GLYCOSYLATED HEMOGLOBIN (HGB A1C): Hemoglobin A1C: 7

## 2015-12-03 MED ORDER — SITAGLIPTIN PHOSPHATE 100 MG PO TABS: 100.0000 mg | ORAL_TABLET | Freq: Every day | ORAL | Status: DC

## 2015-12-03 NOTE — Progress Notes (Signed)
Patient ID: Carmen LevyCarla L Basques, female   DOB: 02/17/1961, 55 y.o.   MRN: [id number]  HPI: Carmen Anderson is a 55 y.o.-year-old female, initially referred by her PCP, Olean Reeeborah Gessner, NP, for management of DM2, dx in 2001-2002, non-insulin-dependent, uncontrolled, with complications (PN). Last visit 2 mo ago.  Last hemoglobin A1c was: Lab Results  Component Value Date   HGBA1C 7.9* 09/05/2015   HGBA1C 7.7* 05/09/2015   HGBA1C 7.0 10/27/2014   Pt is on a regimen of: - Metformin ER 500 mg 2x a day, with meals (decreased from 3x a day Spring 2016) - Januvia 100 mg daily in am (09/2015)  Pt checks her sugars 2x a day and they are: - am: 80-120 >> 97-144, 152 - 2h after b'fast: n/c >> 84-173, 209, 285 - before lunch: n/c >> 68, 77-137 - 2h after lunch: n/c >> 100-178, 199 - before dinner: 100-140 >> 74-127 - 2h after dinner: n/c >> 106-174, 231, 261 - bedtime: n/c >> 89-150, 197 - nighttime: n/c No lows. Lowest sugar was 38 1 mo ago b/c skips meals >> 68; she has hypoglycemia awareness at 10950s.  Highest sugar was 169 >> 285.  Glucometer: FreeStyle >> [facility]  Pt's meals are: - Breakfast: PB sandwich on wheatbread or wheat muffin w/ 1 egg - Lunch: salads or [place] sandwich on wheat bread - Dinner: soups, salad, meat 2x a weekly + veggies - Snacks: pineapple, celery  Exercises by walking daily.  She is more active at work - in last 4 years.  - no CKD, last BUN/creatinine:  Lab Results  Component Value Date   BUN 10 09/05/2015   CREATININE 0.70 09/05/2015  On Lisinopril. - last set of lipids: Lab Results  Component Value Date   CHOL 171 05/09/2015   HDL 37* 05/09/2015   LDLCALC 93 05/09/2015   TRIG 204* 05/09/2015   CHOLHDL 4.6 05/09/2015  On Lipitor. On Fish oil. - last eye exam was in 07/2015 - [facility]. No DR.  - + numbness and tingling in her feet. On Neurontin 100 mg capsule at bedtime.  Pt has FH of DM in mother and older sister.  She has HTN, HL,  hypothyroidism (on LT4 50 mcg)  ROS: Constitutional: no weight gain/loss, + fatigue, + subjective hypothermia Eyes: no blurry vision, no xerophthalmia ENT: no sore throat, no nodules palpated in throat, no dysphagia/odynophagia, no hoarseness Cardiovascular: no CP/SOB/palpitations/leg swelling Respiratory: no cough/SOB Gastrointestinal: no N/V/D/C Musculoskeletal: no muscle/joint aches Skin: no rashes Neurological: no tremors/numbness/tingling/dizziness  I reviewed pt's medications, allergies, PMH, social hx, family hx, and changes were documented in the history of present illness. Otherwise, unchanged from my initial visit note.  Past Medical History  Diagnosis Date  . Diabetes mellitus   . Hyperlipidemia   . Hypertension   . DM type 2 (diabetes mellitus, type 2) (HCC)   . Allergy   . Hypothyroidism    Past Surgical History  Procedure Laterality Date  . Abdominal hysterectomy  1995  . Stapedectomy      Right ear   Social History   Social History  . Marital Status: Married    Spouse Name: N/A  . Number of Children: 0  . Years of Education: 12   Occupational History  . property asst. Production designer, theatre/television/filmmanager    Social History Main Topics  . Smoking status: Current Every Day Smoker -- 0.50 packs/day for 10 years    Types: Cigarettes  . Smokeless tobacco: Not on file  Comment: pt states thinking of quitting  . Alcohol Use: No  . Drug Use: No   Social History Narrative   Exercise: walking, 4 times/week for 30 minutes.   Current Outpatient Prescriptions on File Prior to Visit  Medication Sig Dispense Refill  . atorvastatin (LIPITOR) 40 MG tablet Take 1 tablet (40 mg total) by mouth daily 90 tablet 1  . fish oil-omega-3 fatty acids 1000 MG capsule Take 2 g by mouth daily.    [name] gabapentin (NEURONTIN) 100 MG capsule Take 1 capsule (100 mg total) by mouth at bedtime. 90 capsule 1  . levothyroxine (SYNTHROID, LEVOTHROID) 50 MCG tablet Take 1 tablet (50 mcg total) by mouth daily. 90  tablet 1  . lisinopril (PRINIVIL,ZESTRIL) 5 MG tablet Take 1 tablet (5 mg total) by mouth daily. 90 tablet 1  . metFORMIN (GLUCOPHAGE-XR) 500 MG 24 hr tablet TAKE 1 TABLET BY MOUTH TWICE DAILY WITH MEALS AS DIRECTED 180 tablet 1  . sitaGLIPtin (JANUVIA) 100 MG tablet Take 1 tablet (100 mg total) by mouth daily. 30 tablet 2   No current facility-administered medications on file prior to visit.   Allergies  Allergen Reactions  . Aspirin     Burns stomach  . Codeine     rash   Family History  Problem Relation Age of Onset  . Diabetes Mother   . Heart disease Mother   . Stroke Father     2011  . Hyperlipidemia Father   . Diabetes Sister   . Heart disease Sister   . Heart disease Brother     congenital   PE: BP 112/70 mmHg  Pulse 70  Wt 121 lb (54.885 kg)  SpO2 97% Body mass index is 20.76 kg/(m^2). Wt Readings from Last 3 Encounters:  12/03/15 121 lb (54.885 kg)  09/25/15 120 lb 12.8 oz (54.795 kg)  09/05/15 117 lb (53.071 kg)   Constitutional: normal weight, in NAD Eyes: PERRLA, EOMI, no exophthalmos ENT: moist mucous membranes, no thyromegaly, no cervical lymphadenopathy Cardiovascular: RRR, No MRG Respiratory: CTA B Gastrointestinal: abdomen soft, NT, ND, BS+ Musculoskeletal: no deformities, strength intact in all 4 Skin: moist, warm, no rashes Neurological: no tremor with outstretched hands, DTR normal in all 4  ASSESSMENT: 1. DM2, non-insulin-dependent, uncontrolled, with complications - PN  PLAN:  1. Patient with long-standing, mild diabetes, on oral antidiabetic regimen, which became insufficient after she decreased the metformin dose from 3 to 2 tabs a day (stomach discomfort). We started Januvia at last visit >> sugars are great, except few 200 spikes after meals, especially if stressed. - I suggested to:  Patient Instructions  Please continue  - Metformin ER 500 mg 2x a day with meals. - Januvia 100 mg in am, before b'fast.  Please let me know if the  sugars are consistently <80 or >200.  Please return in 3 months with your sugar log.   - continue checking sugars at different times of the day - check 1-2 times a day, rotating checks - advised for yearly eye exams  - checked HbA1c today >> 7.0% (better!) - Return to clinic in 3 mo with sugar log

## 2015-12-03 NOTE — Patient Instructions (Signed)
Please continue  - Metformin ER 500 mg 2x a day with meals. - Januvia 100 mg in am, before b'fast.  Please let me know if the sugars are consistently <80 or >200.  Please return in 3 months with your sugar log.

## 2015-12-18 ENCOUNTER — Encounter: Payer: Self-pay | Admitting: Family Medicine

## 2015-12-18 ENCOUNTER — Ambulatory Visit (INDEPENDENT_AMBULATORY_CARE_PROVIDER_SITE_OTHER): Payer: BLUE CROSS/BLUE SHIELD | Admitting: Family Medicine

## 2015-12-18 VITALS — BP 108/70 | HR 68 | Temp 97.7°F | Resp 16 | Ht 64.0 in | Wt 117.2 lb

## 2015-12-18 DIAGNOSIS — E785 Hyperlipidemia, unspecified: Secondary | ICD-10-CM | POA: Diagnosis not present

## 2015-12-18 DIAGNOSIS — I10 Essential (primary) hypertension: Secondary | ICD-10-CM | POA: Diagnosis not present

## 2015-12-18 DIAGNOSIS — E1165 Type 2 diabetes mellitus with hyperglycemia: Secondary | ICD-10-CM

## 2015-12-18 DIAGNOSIS — E1142 Type 2 diabetes mellitus with diabetic polyneuropathy: Secondary | ICD-10-CM | POA: Diagnosis not present

## 2015-12-18 DIAGNOSIS — Z720 Tobacco use: Secondary | ICD-10-CM | POA: Diagnosis not present

## 2015-12-18 DIAGNOSIS — Z23 Encounter for immunization: Secondary | ICD-10-CM | POA: Diagnosis not present

## 2015-12-18 LAB — LIPID PANEL
Cholesterol: 159 mg/dL (ref 125–200)
HDL: 42 mg/dL — ABNORMAL LOW (ref >=46)
LDL Cholesterol: 99 mg/dL (ref <130)
Total CHOL/HDL Ratio: 3.8 Ratio (ref <=5.0)
Triglycerides: 89 mg/dL (ref <150)
VLDL: 18 mg/dL (ref <30)

## 2015-12-18 NOTE — Progress Notes (Signed)
   Subjective:    Patient ID: Carmen Anderson, female    DOB: 08/31/1960, 55 y.o.   MRN: [id number]  HPI This is a pleasant 55 yo female who presents today for follow up of DM. She has recently started seeing Dr. Renford DillsGerhge who added Junvuia and increased her metformin. She is checking her blood sugars twice a day with improvement. They are running with in range.  Has decreased smoking to about 6 a day.  Past Medical History  Diagnosis Date  . Diabetes mellitus   . Hyperlipidemia   . Hypertension   . DM type 2 (diabetes mellitus, type 2) (HCC)   . Allergy   . Hypothyroidism    Past Surgical History  Procedure Laterality Date  . Abdominal hysterectomy  1995  . Stapedectomy      Right ear   Family History  Problem Relation Age of Onset  . Diabetes Mother   . Heart disease Mother   . Stroke Father     2011  . Hyperlipidemia Father   . Diabetes Sister   . Heart disease Sister   . Heart disease Brother     congenital   Social History  Substance Use Topics  . Smoking status: Current Every Day Smoker -- 0.50 packs/day for 10 years    Types: Cigarettes  . Smokeless tobacco: None     Comment: pt states thinking of quitting  . Alcohol Use: No      Review of Systems No chest pain, no SOB, no edema, no dysuria, no vaginal symptoms    Objective:   Physical Exam Physical Exam  Constitutional: Oriented to person, place, and time. She appears well-developed and well-nourished.  HENT:  Head: Normocephalic and atraumatic.  Eyes: Conjunctivae are normal.  Neck: Normal range of motion. Neck supple.  Cardiovascular: Normal rate, regular rhythm and normal heart sounds.   Pulmonary/Chest: Effort normal and breath sounds normal.  Musculoskeletal: Normal range of motion.  Neurological: Alert and oriented to person, place, and time.  Skin: Skin is warm and dry.  Psychiatric: Normal mood and affect. Behavior is normal. Judgment and thought content normal.  Vitals reviewed.     BP  108/70 mmHg  Pulse 68  Temp(Src) 97.7 F (36.5 C) (Oral)  Resp 16  Ht 5\' 4"  (1.626 m)  Wt 117 lb 3.2 oz (53.162 kg)  BMI 20.11 kg/m2  SpO2 98% Wt Readings from Last 3 Encounters:  12/18/15 117 lb 3.2 oz (53.162 kg)  12/03/15 121 lb (54.885 kg)  09/25/15 120 lb 12.8 oz (54.795 kg)       Assessment & Plan:  1. Essential hypertension - well controlled   2. Poorly controlled type 2 diabetes mellitus with peripheral neuropathy (HCC) - continue current meds and follow up with Dr. Wyonia HoughGerghe - reviewed potential medication side effects  3. Hyperlipidemia - FLP  4. Tobacco user - has decreased use, encouraged her to continue to decrease and consider smoking cessation  - follow up in 6 months for CPE   Olean Reeeborah Gessner, FNP-BC  [facility]  12/18/2015 8:40 AM

## 2015-12-18 NOTE — Patient Instructions (Addendum)
Keep up the good work with your diet    IF you received an x-ray today, you will receive an invoice from [facility]. Please contact [facility] at [phone] with questions or concerns regarding your invoice.   IF you received labwork today, you will receive an invoice from [facility]. Please contact [facility] at [phone] with questions or concerns regarding your invoice.   Our billing staff will not be able to assist you with questions regarding bills from these companies.  You will be contacted with the lab results as soon as they are available. The fastest way to get your results is to activate your My Chart account. Instructions are located on the last page of this paperwork. If you have not heard from us regarding the results in 2 weeks, please contact this office.

## 2016-01-20 ENCOUNTER — Other Ambulatory Visit: Payer: Self-pay | Admitting: Family Medicine

## 2016-03-04 ENCOUNTER — Encounter: Payer: Self-pay | Admitting: Internal Medicine

## 2016-03-04 ENCOUNTER — Ambulatory Visit (INDEPENDENT_AMBULATORY_CARE_PROVIDER_SITE_OTHER): Payer: BLUE CROSS/BLUE SHIELD | Admitting: Internal Medicine

## 2016-03-04 VITALS — BP 130/72 | HR 71 | Ht 64.0 in | Wt 116.0 lb

## 2016-03-04 DIAGNOSIS — E1142 Type 2 diabetes mellitus with diabetic polyneuropathy: Secondary | ICD-10-CM | POA: Diagnosis not present

## 2016-03-04 DIAGNOSIS — E1165 Type 2 diabetes mellitus with hyperglycemia: Secondary | ICD-10-CM

## 2016-03-04 DIAGNOSIS — E1140 Type 2 diabetes mellitus with diabetic neuropathy, unspecified: Secondary | ICD-10-CM | POA: Diagnosis not present

## 2016-03-04 LAB — POCT GLYCOSYLATED HEMOGLOBIN (HGB A1C): Hemoglobin A1C: 7

## 2016-03-04 MED ORDER — SITAGLIPTIN PHOSPHATE 100 MG PO TABS: 100.0000 mg | ORAL_TABLET | Freq: Every day | ORAL | 3 refills | Status: DC

## 2016-03-04 MED ORDER — METFORMIN HCL ER 500 MG PO TB24: ORAL_TABLET | ORAL | 1 refills | Status: DC

## 2016-03-04 NOTE — Progress Notes (Signed)
Patient ID: Carmen Anderson, female   DOB: 04/28/1961, 55 y.o.   MRN: [id number]  HPI: Carmen Anderson is a 55 y.o.-year-old female, initially referred by her PCP, Carmen Reeeborah Gessner, NP, for management of DM2, dx in 2001-2002, non-insulin-dependent, uncontrolled, with complications (PN). Last visit 3 mo ago.  Last hemoglobin A1c was: Lab Results  Component Value Date   HGBA1C 7.0 12/03/2015   HGBA1C 7.9 (H) 09/05/2015   HGBA1C 7.7 (H) 05/09/2015   Pt is on a regimen of: - Metformin ER 500 mg 2x a day, with meals (decreased from 3x a day Spring 2016) - Januvia 100 mg daily in am (09/2015)  Pt checks her sugars 2x a day and they are: - am: 80-120 >> 97-144, 152 >> 87-101, 156 - 2h after b'fast: n/c >> 84-173, 209, 285 >> 90-160 - before lunch: n/c >> 68, 77-137 >> 89-102 - 2h after lunch: n/c >> 100-178, 199 >> 97-168 - before dinner: 100-140 >> 74-127 >> 90-101 - 2h after dinner: n/c >> 106-174, 231, 261 >> 129-166 - bedtime: n/c >> 89-150, 197 >> 99-121 - nighttime: n/c No lows. Lowest sugar was 38 1 mo ago b/c skips meals >> 68 >> 87; she has hypoglycemia awareness at 6250s.  Highest sugar was 169 >> 285 >> 166  Glucometer: FreeStyle >> [facility]  Pt's meals are: - Breakfast: PB sandwich on wheatbread or wheat muffin w/ 1 egg - Lunch: salads or [place] sandwich on wheat bread - Dinner: soups, salad, meat 2x a weekly + veggies - Snacks: pineapple, celery  Exercises by walking daily.  She is more active at work - in last 4 years.  - no CKD, last BUN/creatinine:  Lab Results  Component Value Date   BUN 10 09/05/2015   CREATININE 0.70 09/05/2015  On Lisinopril. - last set of lipids: Lab Results  Component Value Date   CHOL 159 12/18/2015   HDL 42 (L) 12/18/2015   LDLCALC 99 12/18/2015   TRIG 89 12/18/2015   CHOLHDL 3.8 12/18/2015  On Lipitor. On Fish oil. - last eye exam was in 07/2015 - [facility]. No DR.  - + numbness and tingling in her feet. On Neurontin 100 mg  capsule at bedtime.  She has HTN, HL, hypothyroidism (on LT4 50 mcg). Lab Results  Component Value Date   TSH 3.006 05/09/2015    ROS: Constitutional: no weight gain/loss, + fatigue Eyes: no blurry vision, no xerophthalmia ENT: no sore throat, no nodules palpated in throat, no dysphagia/odynophagia, no hoarseness Cardiovascular: no CP/SOB/palpitations/leg swelling Respiratory: no cough/SOB Gastrointestinal: no N/V/D/C Musculoskeletal: no muscle/joint aches Skin: no rashes Neurological: no tremors/numbness/tingling/dizziness  I reviewed pt's medications, allergies, PMH, social hx, family hx, and changes were documented in the history of present illness. Otherwise, unchanged from my initial visit note.  Past Medical History:  Diagnosis Date  . Allergy   . Diabetes mellitus   . DM type 2 (diabetes mellitus, type 2) (HCC)   . Hyperlipidemia   . Hypertension   . Hypothyroidism    Past Surgical History:  Procedure Laterality Date  . ABDOMINAL HYSTERECTOMY  1995  . STAPEDECTOMY     Right ear   Social History   Social History  . Marital Status: Married    Spouse Name: N/A  . Number of Children: 0  . Years of Education: 12   Occupational History  . property asst. Production designer, theatre/television/filmmanager    Social History Main Topics  . Smoking status: Current Every Day Smoker --  0.50 packs/day for 10 years    Types: Cigarettes  . Smokeless tobacco: Not on file     Comment: pt states thinking of quitting  . Alcohol Use: No  . Drug Use: No   Social History Narrative   Exercise: walking, 4 times/week for 30 minutes.   Current Outpatient Prescriptions on File Prior to Visit  Medication Sig Dispense Refill  . atorvastatin (LIPITOR) 40 MG tablet Take 1 tablet (40 mg total) by mouth daily 90 tablet 1  . fish oil-omega-3 fatty acids 1000 MG capsule Take 2 g by mouth daily.    [name]. gabapentin (NEURONTIN) 100 MG capsule TAKE ONE CAPSULE BY MOUTH AT BEDTIME 90 capsule 3  . levothyroxine (SYNTHROID,  LEVOTHROID) 50 MCG tablet Take 1 tablet (50 mcg total) by mouth daily. 90 tablet 1  . lisinopril (PRINIVIL,ZESTRIL) 5 MG tablet Take 1 tablet (5 mg total) by mouth daily. 90 tablet 1  . metFORMIN (GLUCOPHAGE-XR) 500 MG 24 hr tablet TAKE 1 TABLET BY MOUTH TWICE DAILY WITH MEALS AS DIRECTED 180 tablet 1  . sitaGLIPtin (JANUVIA) 100 MG tablet Take 1 tablet (100 mg total) by mouth daily. 90 tablet 1   No current facility-administered medications on file prior to visit.    Allergies  Allergen Reactions  . Aspirin     Burns stomach  . Codeine     rash   Family History  Problem Relation Age of Onset  . Diabetes Mother   . Heart disease Mother   . Stroke Father     2011  . Hyperlipidemia Father   . Diabetes Sister   . Heart disease Sister   . Heart disease Brother     congenital   PE: BP 130/72 (BP Location: Left Arm, Patient Position: Sitting)   Pulse 71   Ht 5\' 4"  (1.626 m)   Wt 116 lb (52.6 kg)   SpO2 98%   BMI 19.91 kg/m  Body mass index is 19.91 kg/m. Wt Readings from Last 3 Encounters:  03/04/16 116 lb (52.6 kg)  12/18/15 117 lb 3.2 oz (53.2 kg)  12/03/15 121 lb (54.9 kg)   Constitutional: normal weight, in NAD Eyes: PERRLA, EOMI, no exophthalmos ENT: moist mucous membranes, no thyromegaly, no cervical lymphadenopathy Cardiovascular: RRR, No MRG Respiratory: CTA B Gastrointestinal: abdomen soft, NT, ND, BS+ Musculoskeletal: no deformities, strength intact in all 4 Skin: moist, warm, no rashes Neurological: no tremor with outstretched hands, DTR normal in all 4  ASSESSMENT: 1. DM2, non-insulin-dependent, uncontrolled, with complications - PN - on Neurontin  PLAN:  1. Patient with long-standing, mild diabetes, on oral antidiabetic regimen, which became insufficient after she decreased the metformin dose from 3 to 2 tabs a day (stomach discomfort). We started Januvia before last visit >> sugars are great, improved since last visit. - I suggested to:  Patient  Instructions  Please continue  - Metformin ER 500 mg 2x a day with meals. - Januvia 100 mg in am, before b'fast.  Please let me know if the sugars are consistently <80 or >200.  Please return in 3 months with your sugar log.   - continue checking sugars at different times of the day - check 1-2 times a day, rotating checks - advised for yearly eye exams >> she is UTD - refilled her Metformin and Januvia - checked HbA1c today >> 7.0% (stable) - Return to clinic in 3 mo with sugar log   Carlus Pavlovristina Jenelle Drennon, MD PhD [facility]

## 2016-03-04 NOTE — Addendum Note (Signed)
Addended by: Cydney OkHOMPSON, Tanise Russman T on: 03/04/2016 04:33 PM   Modules accepted: Orders

## 2016-03-04 NOTE — Patient Instructions (Signed)
Patient Instructions  Please continue  - Metformin ER 500 mg 2x a day with meals. - Januvia 100 mg in am, before b'fast.  Please let me know if the sugars are consistently <80 or >200.  Please return in 3 months with your sugar log.

## 2016-04-30 DIAGNOSIS — Z23 Encounter for immunization: Secondary | ICD-10-CM | POA: Diagnosis not present

## 2016-05-26 ENCOUNTER — Other Ambulatory Visit: Payer: Self-pay | Admitting: Family Medicine

## 2016-05-26 DIAGNOSIS — Z1231 Encounter for screening mammogram for malignant neoplasm of breast: Secondary | ICD-10-CM

## 2016-06-03 ENCOUNTER — Encounter: Payer: Self-pay | Admitting: Internal Medicine

## 2016-06-03 ENCOUNTER — Ambulatory Visit (INDEPENDENT_AMBULATORY_CARE_PROVIDER_SITE_OTHER): Payer: BLUE CROSS/BLUE SHIELD | Admitting: Internal Medicine

## 2016-06-03 VITALS — BP 130/78 | HR 69 | Ht 64.0 in | Wt 116.8 lb

## 2016-06-03 DIAGNOSIS — E1142 Type 2 diabetes mellitus with diabetic polyneuropathy: Secondary | ICD-10-CM

## 2016-06-03 DIAGNOSIS — E1165 Type 2 diabetes mellitus with hyperglycemia: Secondary | ICD-10-CM | POA: Diagnosis not present

## 2016-06-03 LAB — POCT GLYCOSYLATED HEMOGLOBIN (HGB A1C): Hemoglobin A1C: 7

## 2016-06-03 NOTE — Patient Instructions (Addendum)
Patient Instructions  Please continue  - Metformin ER 500 mg 2x a day with meals. - Januvia 100 mg in am, before b'fast.  Try the following combination for neuropathy: - alpha-lipoic acid 600 mg 2x a day - B complex 1 tablet once a day  Please return in 4 months with your sugar log.

## 2016-06-03 NOTE — Progress Notes (Signed)
Patient ID: [name], female   DOB: 1961-02-12, 55 y.o.   MRN: [id number]  HPI: Carmen Anderson is a 55 y.o.-year-old female, initially referred by her PCP, Carmen Ree, NP, for management of DM2, dx in 2001-2002, non-insulin-dependent, uncontrolled, with complications (PN). Last visit 3 mo ago.  Last hemoglobin A1c was: Lab Results  Component Value Date   HGBA1C 7.0 06/03/2016   HGBA1C 7.0 03/04/2016   HGBA1C 7.0 12/03/2015   Pt is on a regimen of: - Metformin ER 500 mg 2x a day, with meals (decreased from 3x a day Spring 2016) - Januvia 100 mg daily in am (09/2015)  Pt checks her sugars 2x a day and they are: - am: 80-120 >> 97-144, 152 >> 87-101, 156 >> 70-121 - 2h after b'fast: n/c >> 84-173, 209, 285 >> 90-160 >> 110-165, 181 - before lunch: n/c >> 68, 77-137 >> 89-102 >> 77-99 - 2h after lunch: n/c >> 100-178, 199 >> 97-168 >> 139-170, 178 - before dinner: 100-140 >> 74-127 >> 90-101 >> 79-100, 120 - 2h after dinner: n/c >> 106-174, 231, 261 >> 129-166 >> 103-177, 196 - bedtime: n/c >> 89-150, 197 >> 99-121 >> 101-178 - nighttime: n/c >> n/c No lows. Lowest sugar was 38 1 mo ago b/c skips meals >> 68 >> 87 >> 70; she has hypoglycemia awareness at 56s.  Highest sugar was 169 >> 285 >> 166 >> 196  Glucometer: FreeStyle >> [facility]  Pt's meals are: - Breakfast: PB sandwich on wheatbread or wheat muffin w/ 1 egg - Lunch: salads or [place] sandwich on wheat bread - Dinner: soups, salad, meat 2x a weekly + veggies - Snacks: pineapple, celery  Exercises by walking daily.  - no CKD, last BUN/creatinine:  Lab Results  Component Value Date   BUN 10 09/05/2015   CREATININE 0.70 09/05/2015  On Lisinopril. - last set of lipids: Lab Results  Component Value Date   CHOL 159 12/18/2015   HDL 42 (L) 12/18/2015   LDLCALC 99 12/18/2015   TRIG 89 12/18/2015   CHOLHDL 3.8 12/18/2015  On Lipitor. On Fish oil. - last eye exam was in 07/2015 - [facility]. No DR.  - +  numbness and tingling in her feet. On Neurontin 100 mg capsule at bedtime.   She has HTN, HL, hypothyroidism (on LT4 50 mcg). Lab Results  Component Value Date   TSH 3.006 05/09/2015    ROS: Constitutional: no weight gain/loss, no fatigue Eyes: no blurry vision, no xerophthalmia ENT: no sore throat, no nodules palpated in throat, no dysphagia/odynophagia, no hoarseness Cardiovascular: no CP/SOB/palpitations/leg swelling Respiratory: no cough/SOB Gastrointestinal: no N/V/D/C Musculoskeletal: no muscle/joint aches Skin: no rashes Neurological: no tremors/numbness/tingling/dizziness  I reviewed pt's medications, allergies, PMH, social hx, family hx, and changes were documented in the history of present illness. Otherwise, unchanged from my initial visit note.  Past Medical History:  Diagnosis Date  . Allergy   . Diabetes mellitus   . DM type 2 (diabetes mellitus, type 2) (HCC)   . Hyperlipidemia   . Hypertension   . Hypothyroidism    Past Surgical History:  Procedure Laterality Date  . ABDOMINAL HYSTERECTOMY  1995  . STAPEDECTOMY     Right ear   Social History   Social History  . Marital Status: Married    Spouse Name: N/A  . Number of Children: 0  . Years of Education: 12   Occupational History  . property asst. Production designer, [employer]/tele[employer]    Social  History Main Topics  . Smoking status: Current Every Day Smoker -- 0.50 packs/day for 10 years    Types: Cigarettes  . Smokeless tobacco: Not on file     Comment: pt states thinking of quitting  . Alcohol Use: No  . Drug Use: No   Social History Narrative   Exercise: walking, 4 times/week for 30 minutes.   Current Outpatient Prescriptions on File Prior to Visit  Medication Sig Dispense Refill  . atorvastatin (LIPITOR) 40 MG tablet Take 1 tablet (40 mg total) by mouth daily 90 tablet 1  . fish oil-omega-3 fatty acids 1000 MG capsule Take 2 g by mouth daily.    [name]. gabapentin (NEURONTIN) 100 MG capsule TAKE ONE CAPSULE BY MOUTH AT  BEDTIME 90 capsule 3  . levothyroxine (SYNTHROID, LEVOTHROID) 50 MCG tablet Take 1 tablet (50 mcg total) by mouth daily. 90 tablet 1  . lisinopril (PRINIVIL,ZESTRIL) 5 MG tablet Take 1 tablet (5 mg total) by mouth daily. 90 tablet 1  . metFORMIN (GLUCOPHAGE-XR) 500 MG 24 hr tablet TAKE 1 TABLET BY MOUTH TWICE DAILY WITH MEALS AS DIRECTED 180 tablet 1  . sitaGLIPtin (JANUVIA) 100 MG tablet Take 1 tablet (100 mg total) by mouth daily. 90 tablet 3   No current facility-administered medications on file prior to visit.    Allergies  Allergen Reactions  . Aspirin     Burns stomach  . Codeine     rash   Family History  Problem Relation Age of Onset  . Diabetes Mother   . Heart disease Mother   . Stroke Father     2011  . Hyperlipidemia Father   . Diabetes Sister   . Heart disease Sister   . Heart disease Brother     congenital   PE: BP 130/78   Pulse 69   Ht 5\' 4"  (1.626 m)   Wt 116 lb 12.8 oz (53 kg)   SpO2 98%   BMI 20.05 kg/m  Body mass index is 20.05 kg/m. Wt Readings from Last 3 Encounters:  06/03/16 116 lb 12.8 oz (53 kg)  03/04/16 116 lb (52.6 kg)  12/18/15 117 lb 3.2 oz (53.2 kg)   Constitutional: normal weight, in NAD Eyes: PERRLA, EOMI, no exophthalmos ENT: moist mucous membranes, no thyromegaly, no cervical lymphadenopathy Cardiovascular: RRR, No MRG Respiratory: CTA B Gastrointestinal: abdomen soft, NT, ND, BS+ Musculoskeletal: no deformities, strength intact in all 4 Skin: moist, warm, no rashes Neurological: no tremor with outstretched hands, DTR normal in all 4  ASSESSMENT: 1. DM2, non-insulin-dependent, uncontrolled, with complications - PN - on Neurontin  2. PN  - more burning recently  PLAN:  1. Patient with long-standing, mild diabetes, on oral antidiabetic regimen, with stable control. - I suggested to:   Patient Instructions  Please continue  - Metformin ER 500 mg 2x a day with meals. - Januvia 100 mg in am, before b'fast.  Try the  following combination for neuropathy: - alpha-lipoic acid 600 mg 2x a day - B complex 1 tablet once a day  Please return in 4 months with your sugar log.   - continue checking sugars at different times of the day - check 1-2 times a day, rotating checks - advised for yearly eye exams >> she is UTD - checked HbA1c today >> 7.0% (stable) - Return to clinic in 3 mo with sugar log   2. Diabetic PN - I suggested to add: - alpha-lipoic acid 600 mg twice a day - B complex  1 tablet once a day   Carlus Pavlovristina Crockett Rallo, MD PhD [facility]

## 2016-06-19 ENCOUNTER — Ambulatory Visit: Payer: BLUE CROSS/BLUE SHIELD

## 2016-06-25 ENCOUNTER — Ambulatory Visit: Payer: BLUE CROSS/BLUE SHIELD | Admitting: Family Medicine

## 2016-07-10 ENCOUNTER — Ambulatory Visit (INDEPENDENT_AMBULATORY_CARE_PROVIDER_SITE_OTHER): Payer: BLUE CROSS/BLUE SHIELD | Admitting: Family Medicine

## 2016-07-10 ENCOUNTER — Encounter: Payer: Self-pay | Admitting: Family Medicine

## 2016-07-10 VITALS — BP 124/80 | HR 62 | Temp 97.7°F | Resp 16 | Ht 64.0 in | Wt 114.0 lb

## 2016-07-10 DIAGNOSIS — I10 Essential (primary) hypertension: Secondary | ICD-10-CM | POA: Diagnosis not present

## 2016-07-10 DIAGNOSIS — E119 Type 2 diabetes mellitus without complications: Secondary | ICD-10-CM

## 2016-07-10 DIAGNOSIS — E7800 Pure hypercholesterolemia, unspecified: Secondary | ICD-10-CM

## 2016-07-10 DIAGNOSIS — E785 Hyperlipidemia, unspecified: Secondary | ICD-10-CM

## 2016-07-10 DIAGNOSIS — Z1211 Encounter for screening for malignant neoplasm of colon: Secondary | ICD-10-CM | POA: Diagnosis not present

## 2016-07-10 DIAGNOSIS — E039 Hypothyroidism, unspecified: Secondary | ICD-10-CM

## 2016-07-10 DIAGNOSIS — E034 Atrophy of thyroid (acquired): Secondary | ICD-10-CM

## 2016-07-10 MED ORDER — LEVOTHYROXINE SODIUM 50 MCG PO TABS: 50.0000 ug | ORAL_TABLET | Freq: Every day | ORAL | 1 refills | Status: DC

## 2016-07-10 MED ORDER — ATORVASTATIN CALCIUM 40 MG PO TABS: 40.0000 mg | ORAL_TABLET | Freq: Every day | ORAL | 1 refills | Status: DC

## 2016-07-10 MED ORDER — LISINOPRIL 5 MG PO TABS: 5.0000 mg | ORAL_TABLET | Freq: Every day | ORAL | 1 refills | Status: DC

## 2016-07-10 NOTE — Patient Instructions (Addendum)
  Return to clinic in 2 weeks for thyroid level check. The blood test is already ordered.  Your next visit with the doctor is in 6 months for blood pressure and thyroid follow up.  You will be contacted about colonoscopy.  You can ask for a Saturday appointment.    IF you received an x-ray today, you will receive an invoice from [facility]. Please contact [facility] at [phone] with questions or concerns regarding your invoice.   IF you received labwork today, you will receive an invoice from [facility]. Please contact [facility] at [phone] with questions or concerns regarding your invoice.   Our billing staff will not be able to assist you with questions regarding bills from these companies.  You will be contacted with the lab results as soon as they are available. The fastest way to get your results is to activate your My Chart account. Instructions are located on the last page of this paperwork. If you have not heard from us regarding the results in 2 weeks, please contact this office.      Colorectal Cancer Screening Colorectal cancer screening is a group of tests used to check for colorectal cancer. Colorectal refers to your colon and rectum. Your colon and rectum are located at the end of your large intestine and carry your bowel movements out of your body. Why is colorectal cancer screening done? It is common for abnormal growths (polyps) to form in the lining of your colon, especially as you get older. These polyps can be cancerous or become cancerous. If colorectal cancer is found at an early stage, it is treatable. Who should be screened for colorectal cancer? Screening is recommended for all adults at average risk starting at age 54. Tests may be recommended every 1 to 10 years. Your health care provider may recommend earlier or more frequent screening if you have:  A history of colorectal cancer or polyps.  A family member with a history of colorectal  cancer or polyps.  Inflammatory bowel disease, such as ulcerative colitis or Crohn disease.  A type of hereditary colon cancer syndrome.  Colorectal cancer symptoms. Types of screening tests There are several types of colorectal screening tests. They include:  Guaiac-based fecal occult blood testing.  Fecal immunochemical test (FIT).  Stool DNA test.  Barium enema.  Virtual colonoscopy.  Sigmoidoscopy. During this test, a sigmoidoscope is used to examine your rectum and lower colon. A sigmoidoscope is a flexible tube with a camera that is inserted through your anus into your rectum and lower colon.  Colonoscopy. During this test, a colonoscope is used to examine your entire colon. A colonoscope is a long, thin, flexible tube with a camera. This test examines your entire colon and rectum. This information is not intended to replace advice given to you by your health care provider. Make sure you discuss any questions you have with your health care provider. Document Released: 12/18/2009 Document Revised: 02/07/2016 Document Reviewed: 10/06/2013 Elsevier Interactive Patient Education  2017 [employer]

## 2016-07-10 NOTE — Progress Notes (Signed)
Chief Complaint  Patient presents with  . Medication Refill    HPI  Diabetes Mellitus: Patient presents for follow up of diabetes. Symptoms: paresthesia of the feet and hands occasionally and takes gabapentin. Symptoms have been well-controlled. Patient denies hyperglycemia, hypoglycemia , nausea, polydipsia, polyuria and visual disturbances.  Evaluation to date has been included: fasting blood sugar and hemoglobin A1C.  She follows with Endocrinology and is compliant with her medications and diet. Lab Results  Component Value Date   HGBA1C 7.0 06/03/2016   Hypothyroidism: Patient presents for evaluation of thyroid function. Symptoms consist of denies fatigue, weight changes, heat/cold intolerance, bowel/skin changes or CVS symptoms.  The symptoms are no.  The problem has been stable.  Previous thyroid studies include TSH. The hypothyroidism is due to hypothyroidism. Lab Results  Component Value Date   TSH 3.006 05/09/2015  She ran out of her levothyroxine 3 weeks ago.  Hypertension: Patient here for follow-up of elevated blood pressure. She is exercising and is adherent to low salt diet.  Blood pressure is well controlled at home. Cardiac symptoms none. Patient denies chest pain, claudication, dyspnea and exertional chest pressure/discomfort.  Cardiovascular risk factors: diabetes mellitus, dyslipidemia and hypertension. Use of agents associated with hypertension: none. History of target organ damage: none. BP Readings from Last 3 Encounters:  07/10/16 124/80  06/03/16 130/78  03/04/16 130/72   Hyperlipidemia: Patient presents with hyperlipidemia.  She was tested because of screening.  Her last labs are listed below and have improved steadily. negative for chest pains or claudication. There is a family history of hyperlipidemia. There is not a family history of early ischemia heart disease.  Lab Results  Component Value Date   CHOL 159 12/18/2015   CHOL 171 05/09/2015   CHOL 212 (H)  10/27/2014   Lab Results  Component Value Date   HDL 42 (L) 12/18/2015   HDL 37 (L) 05/09/2015   HDL 38 (L) 10/27/2014   Lab Results  Component Value Date   LDLCALC 99 12/18/2015   LDLCALC 93 05/09/2015   LDLCALC 147 (H) 10/27/2014   Lab Results  Component Value Date   TRIG 89 12/18/2015   TRIG 204 (H) 05/09/2015   TRIG 135 10/27/2014   Lab Results  Component Value Date   CHOLHDL 3.8 12/18/2015   CHOLHDL 4.6 05/09/2015   CHOLHDL 5.6 10/27/2014   No results found for: LDLDIRECT   Past Medical History:  Diagnosis Date  . Allergy   . Diabetes mellitus   . DM type 2 (diabetes mellitus, type 2) (HCC)   . Hyperlipidemia   . Hypertension   . Hypothyroidism     Current Outpatient Prescriptions  Medication Sig Dispense Refill  . atorvastatin (LIPITOR) 40 MG tablet Take 1 tablet (40 mg total) by mouth daily. 90 tablet 1  . fish oil-omega-3 fatty acids 1000 MG capsule Take 2 g by mouth daily.    [name]. gabapentin (NEURONTIN) 100 MG capsule TAKE ONE CAPSULE BY MOUTH AT BEDTIME 90 capsule 3  . levothyroxine (SYNTHROID, LEVOTHROID) 50 MCG tablet Take 1 tablet (50 mcg total) by mouth daily. 90 tablet 1  . lisinopril (PRINIVIL,ZESTRIL) 5 MG tablet Take 1 tablet (5 mg total) by mouth daily. 90 tablet 1  . metFORMIN (GLUCOPHAGE-XR) 500 MG 24 hr tablet TAKE 1 TABLET BY MOUTH TWICE DAILY WITH MEALS AS DIRECTED 180 tablet 1  . sitaGLIPtin (JANUVIA) 100 MG tablet Take 1 tablet (100 mg total) by mouth daily. 90 tablet 3   No current facility-administered  medications for this visit.     Allergies:  Allergies  Allergen Reactions  . Aspirin     Burns stomach  . Codeine     rash    Past Surgical History:  Procedure Laterality Date  . ABDOMINAL HYSTERECTOMY  1995  . STAPEDECTOMY     Right ear    Social History   Social History  . Marital status: Married    Spouse name: N/A  . Number of children: N/A  . Years of education: 2112   Occupational History  . property asst. manager  [employer]   Social History Main Topics  . Smoking status: Current Every Day Smoker    Packs/day: 0.50    Years: 10.00    Types: Cigarettes  . Smokeless tobacco: Never Used     Comment: pt states thinking of quitting  . Alcohol use No  . Drug use: No  . Sexual activity: Yes    Birth control/ protection: None     Comment: 1 sexual partner in last year   Other Topics Concern  . None   Social History Narrative   Exercise: walking, 4 times/week for 30 minutes.   Family History  Problem Relation Age of Onset  . Diabetes Mother   . Heart disease Mother   . Stroke Father     2011  . Hyperlipidemia Father   . Diabetes Sister   . Heart disease Sister   . Heart disease Brother     congenital    Review of Systems  Constitutional: Negative for chills, diaphoresis, fever, malaise/fatigue and weight loss.  HENT: Negative for ear pain, hearing loss and tinnitus.   Eyes: Negative for blurred vision and double vision.  Respiratory: Negative for cough, sputum production, shortness of breath and wheezing.   Cardiovascular: Negative for chest pain, palpitations, orthopnea and claudication.  Gastrointestinal: Negative for blood in stool, constipation, heartburn, melena, nausea and vomiting.  Genitourinary: Negative for dysuria, frequency and urgency.  Musculoskeletal: Negative for back pain, myalgias and neck pain.  Skin: Negative for itching and rash.  Neurological: Positive for tingling. Negative for dizziness, tremors, sensory change, speech change, weakness and headaches.  Psychiatric/Behavioral: Negative for depression. The patient is not nervous/anxious and does not have insomnia.    See hpi  Objective: Vitals:   07/10/16 0840  BP: 124/80  Pulse: 62  Resp: 16  Temp: 97.7 F (36.5 C)  TempSrc: Oral  SpO2: 100%  Weight: 114 lb (51.7 kg)  Height: 5\' 4"  (1.626 m)    Physical Exam  Constitutional: She is oriented to person, place, and time. She appears  well-developed and well-nourished.  HENT:  Head: Normocephalic and atraumatic.  Right Ear: External ear normal.  Left Ear: External ear normal.  Nose: Nose normal.  Mouth/Throat: Oropharynx is clear and moist.  Eyes: Conjunctivae and EOM are normal.  Neck: Normal range of motion. Neck supple. No thyromegaly present.  Cardiovascular: Normal rate, regular rhythm and normal heart sounds.   No murmur heard. Pulmonary/Chest: Effort normal and breath sounds normal. No respiratory distress. She has no wheezes.  Abdominal: Soft. Bowel sounds are normal. She exhibits no distension. There is no tenderness.  Musculoskeletal: Normal range of motion. She exhibits no edema.  Neurological: She is alert and oriented to person, place, and time.  Skin: Skin is warm. No erythema.  Psychiatric: She has a normal mood and affect. Her behavior is normal. Judgment and thought content normal.    Assessment and Plan Carmen Anderson was seen  today for medication refill.  Diagnoses and all orders for this visit:  Acquired hypothyroidism- since pt has not taken meds in 2-3 weeks Ordered tsh as a future and advised that she get her levels checked once she resumes her medications -     TSH; Future -     CBC  Essential hypertension- bp well controlled Continue current medication Medication monitoring today -     Microalbumin, urine -     Comprehensive metabolic panel -     lisinopril (PRINIVIL,ZESTRIL) 5 MG tablet; Take 1 tablet (5 mg total) by mouth daily.  Dyslipidemia- discussed medication refill, sent in refill today Continue current medication -     Lipid panel  Well controlled type 2 diabetes mellitus (HCC)- continue current oral meds Continue exercise and ADA diet -     Microalbumin, urine  Encounter for screening colonoscopy- discussed screening and the prep for the test Advised her to hold her diabetes meds on the day of the test since she will be NPO -     [facility] COLONOSCOPY  Hypercholesteremia -      atorvastatin (LIPITOR) 40 MG tablet; Take 1 tablet (40 mg total) by mouth daily.  Hypothyroidism due to acquired atrophy of thyroid -     levothyroxine (SYNTHROID, LEVOTHROID) 50 MCG tablet; Take 1 tablet (50 mcg total) by mouth daily.  A total of 40 minutes were spent face-to-face with the patient during this encounter and over half of that time was spent on counseling and coordination of care.    Travanti Mcmanus A Linzey Ramser

## 2016-07-11 ENCOUNTER — Ambulatory Visit: Payer: BLUE CROSS/BLUE SHIELD

## 2016-07-15 LAB — COMPREHENSIVE METABOLIC PANEL
ALT: 14 IU/L (ref 0–32)
AST: 12 IU/L (ref 0–40)
Albumin/Globulin Ratio: 2.1 (ref 1.2–2.2)
Albumin: 4.8 g/dL (ref 3.5–5.5)
Alkaline Phosphatase: 57 IU/L (ref 39–117)
BUN/Creatinine Ratio: 12 (ref 9–23)
BUN: 8 mg/dL (ref 6–24)
Bilirubin Total: 0.3 mg/dL (ref 0.0–1.2)
CO2: 23 mmol/L (ref 18–29)
Calcium: 9.8 mg/dL (ref 8.7–10.2)
Chloride: 103 mmol/L (ref 96–106)
Creatinine, Ser: 0.68 mg/dL (ref 0.57–1.00)
GFR calc Af Amer: 115 mL/min/{1.73_m2} (ref >59)
GFR calc non Af Amer: 99 mL/min/{1.73_m2} (ref >59)
Globulin, Total: 2.3 g/dL (ref 1.5–4.5)
Glucose: 109 mg/dL — ABNORMAL HIGH (ref 65–99)
Potassium: 4.2 mmol/L (ref 3.5–5.2)
Sodium: 143 mmol/L (ref 134–144)
Total Protein: 7.1 g/dL (ref 6.0–8.5)

## 2016-07-15 LAB — CBC
Hematocrit: 42.9 % (ref 34.0–46.6)
Hemoglobin: 14.4 g/dL (ref 11.1–15.9)
MCH: 30.5 pg (ref 26.6–33.0)
MCHC: 33.6 g/dL (ref 31.5–35.7)
MCV: 91 fL (ref 79–97)
Platelets: 333 10*3/uL (ref 150–379)
RBC: 4.72 x10E6/uL (ref 3.77–5.28)
RDW: 13.5 % (ref 12.3–15.4)
WBC: 10.9 10*3/uL — ABNORMAL HIGH (ref 3.4–10.8)

## 2016-07-15 LAB — LIPID PANEL
Chol/HDL Ratio: 6.9 ratio units — ABNORMAL HIGH (ref 0.0–4.4)
Cholesterol, Total: 276 mg/dL — ABNORMAL HIGH (ref 100–199)
HDL: 40 mg/dL (ref >39)
LDL Calculated: 201 mg/dL — ABNORMAL HIGH (ref 0–99)
Triglycerides: 176 mg/dL — ABNORMAL HIGH (ref 0–149)
VLDL Cholesterol Cal: 35 mg/dL (ref 5–40)

## 2016-07-15 LAB — MICROALBUMIN, URINE

## 2016-07-23 ENCOUNTER — Ambulatory Visit (INDEPENDENT_AMBULATORY_CARE_PROVIDER_SITE_OTHER): Payer: BLUE CROSS/BLUE SHIELD | Admitting: Family Medicine

## 2016-07-23 DIAGNOSIS — E039 Hypothyroidism, unspecified: Secondary | ICD-10-CM | POA: Diagnosis not present

## 2016-07-23 NOTE — Progress Notes (Signed)
Lab only visits

## 2016-07-24 LAB — TSH: TSH: 1.96 u[IU]/mL (ref 0.450–4.500)

## 2016-07-29 ENCOUNTER — Ambulatory Visit
Admission: RE | Admit: 2016-07-29 | Discharge: 2016-07-29 | Disposition: A | Discharge status: Home / Self Care | Payer: BLUE CROSS/BLUE SHIELD | Source: Ambulatory Visit | Attending: Family Medicine | Admitting: Family Medicine

## 2016-07-29 DIAGNOSIS — Z1231 Encounter for screening mammogram for malignant neoplasm of breast: Secondary | ICD-10-CM | POA: Diagnosis not present

## 2016-09-28 ENCOUNTER — Other Ambulatory Visit: Payer: Self-pay | Admitting: Internal Medicine

## 2016-09-28 DIAGNOSIS — E1140 Type 2 diabetes mellitus with diabetic neuropathy, unspecified: Secondary | ICD-10-CM

## 2016-10-02 ENCOUNTER — Ambulatory Visit: Payer: BLUE CROSS/BLUE SHIELD | Admitting: Internal Medicine

## 2016-11-25 ENCOUNTER — Encounter: Payer: Self-pay | Admitting: Internal Medicine

## 2016-11-25 ENCOUNTER — Ambulatory Visit (INDEPENDENT_AMBULATORY_CARE_PROVIDER_SITE_OTHER): Payer: BLUE CROSS/BLUE SHIELD | Admitting: Internal Medicine

## 2016-11-25 VITALS — BP 124/70 | HR 76 | Ht 64.0 in | Wt 117.0 lb

## 2016-11-25 DIAGNOSIS — E1140 Type 2 diabetes mellitus with diabetic neuropathy, unspecified: Secondary | ICD-10-CM

## 2016-11-25 LAB — POCT GLYCOSYLATED HEMOGLOBIN (HGB A1C): Hemoglobin A1C: 6.7

## 2016-11-25 MED ORDER — SITAGLIPTIN PHOSPHATE 100 MG PO TABS: 100.0000 mg | ORAL_TABLET | Freq: Every day | ORAL | 3 refills | Status: DC

## 2016-11-25 MED ORDER — METFORMIN HCL ER 500 MG PO TB24: ORAL_TABLET | ORAL | 3 refills | Status: DC

## 2016-11-25 NOTE — Patient Instructions (Addendum)
Please continue  - Metformin ER 500 mg 2x a day with meals. - Januvia 100 mg in am, before b'fast.  Please return in 4 months with your sugar log.

## 2016-11-25 NOTE — Progress Notes (Signed)
Patient ID: [name], female   DOB: 10/12/1960, 56 y.o.   MRN: [id number]  HPI: Carmen Anderson is a 56 y.o.-year-old female, initially referred by her PCP, Carmen Ree, NP, for management of DM2, dx in 2001-2002, non-insulin-dependent, uncontrolled, with complications (PN). Last visit 6 mo ago.  She has a lot of stress at work.  Last hemoglobin A1c was: Lab Results  Component Value Date   HGBA1C 7.0 06/03/2016   HGBA1C 7.0 03/04/2016   HGBA1C 7.0 12/03/2015   Pt is on a regimen of: - Metformin ER 500 mg 2x a day, with meals (decreased from 3x a day Spring 2016) - tolerates this well - Januvia 100 mg daily in am (09/2015)  Pt checks her sugars 2x a day and they are: - am: 80-120 >> 97-144, 152 >> 87-101, 156 >> 70-121 >> 70-128, 260 x1 - 2h after b'fast: n/c >> 84-173, 209, 285 >> 90-160 >> 110-165, 181 >> 115-169 - before lunch: n/c >> 68, 77-137 >> 89-102 >> 77-99 >> 76-125 - 2h after lunch: n/c >> 100-178, 199 >> 97-168 >> 139-170, 178 >> 125-154, 170, 200 - before dinner: 100-140 >> 74-127 >> 90-101 >> 79-100, 120 >> 75-122, 140 - 2h after dinner: n/c >> 106-174, 231, 261 >> 129-166 >> 103-177, 196 >> 128-172, 180 - bedtime: n/c >> 89-150, 197 >> 99-121 >> 101-178 >> 99-140 - nighttime: n/c >> n/c No lows. Lowest sugar was 38 1 mo ago b/c skips meals >> 68 >> 87 >> 70 >> 70; she has ypoglycemia awareness in the 50s.  Highest sugar was 169 >> 285 >> 166 >> 196 >> 260 x1.  Glucometer: FreeStyle >> [facility]  Pt's meals are: - Breakfast: PB sandwich on wheatbread or wheat muffin w/ 1 egg - Lunch: salads or [place] sandwich on wheat bread - Dinner: soups, salad, meat 2x a weekly (chicken) + veggies - Snacks: pineapple, celery  - no CKD, last BUN/creatinine:  Lab Results  Component Value Date   BUN 8 07/10/2016   CREATININE 0.68 07/10/2016  On Lisinopril. - last set of lipids: Lab Results  Component Value Date   CHOL 276 (H) 07/10/2016   HDL 40 07/10/2016   LDLCALC 201 (H) 07/10/2016   TRIG 176 (H) 07/10/2016   CHOLHDL 6.9 (H) 07/10/2016  On Lipitor. On Fish oil. - last eye exam was in 07/2015 - [facility]. No DR. - + numbness and tingling in her feet. On Neurontin 100 mg capsule at bedtime. At last visit, I recommended the following combination for neuropathy: - alpha-lipoic acid 600 mg 2x a day (did not start) - B complex 1 tablet once a day (takes this)  She has HTN, HL, hypothyroidism (on LT4 50 mcg). Lab Results  Component Value Date   TSH 1.960 07/23/2016    ROS: Constitutional: no weight gain/no weight loss, no fatigue, no subjective hyperthermia, no subjective hypothermia Eyes: no blurry vision, no xerophthalmia ENT: no sore throat, no nodules palpated in throat, no dysphagia, no odynophagia, no hoarseness Cardiovascular: no CP/no SOB/no palpitations/no leg swelling Respiratory: no cough/no SOB/no wheezing Gastrointestinal: no N/no V/no D/no C/no acid reflux Musculoskeletal: no muscle aches/no joint aches Skin: no rashes, no hair loss Neurological: no tremors/no numbness/no tingling/no dizziness  I reviewed pt's medications, allergies, PMH, social hx, family hx, and changes were documented in the history of present illness. Otherwise, unchanged from my initial visit note.   Past Medical History:  Diagnosis Date  . Allergy   .  Diabetes mellitus   . DM type 2 (diabetes mellitus, type 2) (HCC)   . Hyperlipidemia   . Hypertension   . Hypothyroidism    Past Surgical History:  Procedure Laterality Date  . ABDOMINAL HYSTERECTOMY  1995  . STAPEDECTOMY     Right ear   Social History   Social History  . Marital Status: Married    Spouse Name: N/A  . Number of Children: 0  . Years of Education: 12   Occupational History  . property asst. Production designer, theatre/television/filmmanager    Social History Main Topics  . Smoking status: Current Every Day Smoker -- 0.50 packs/day for 10 years    Types: Cigarettes  . Smokeless tobacco: Not on file      Comment: pt states thinking of quitting  . Alcohol Use: No  . Drug Use: No   Social History Narrative   Exercise: walking, 4 times/week for 30 minutes.   Current Outpatient Prescriptions on File Prior to Visit  Medication Sig Dispense Refill  . atorvastatin (LIPITOR) 40 MG tablet Take 1 tablet (40 mg total) by mouth daily. 90 tablet 1  . fish oil-omega-3 fatty acids 1000 MG capsule Take 2 g by mouth daily.    [name]. gabapentin (NEURONTIN) 100 MG capsule TAKE ONE CAPSULE BY MOUTH AT BEDTIME 90 capsule 3  . levothyroxine (SYNTHROID, LEVOTHROID) 50 MCG tablet Take 1 tablet (50 mcg total) by mouth daily. 90 tablet 1  . lisinopril (PRINIVIL,ZESTRIL) 5 MG tablet Take 1 tablet (5 mg total) by mouth daily. 90 tablet 1  . metFORMIN (GLUCOPHAGE-XR) 500 MG 24 hr tablet TAKE 1 TABLET BY MOUTH TWICE DAILY WITH MEALS AS DIRECTED 180 tablet 1  . sitaGLIPtin (JANUVIA) 100 MG tablet Take 1 tablet (100 mg total) by mouth daily. 90 tablet 3   No current facility-administered medications on file prior to visit.    Allergies  Allergen Reactions  . Aspirin     Burns stomach  . Codeine     rash   Family History  Problem Relation Age of Onset  . Diabetes Mother   . Heart disease Mother   . Stroke Father        2011  . Hyperlipidemia Father   . Diabetes Sister   . Heart disease Sister   . Heart disease Brother        congenital   PE: BP 124/70 (BP Location: Left Arm, Patient Position: Sitting)   Pulse 76   Ht 5\' 4"  (1.626 m)   Wt 117 lb (53.1 kg)   SpO2 96%   BMI 20.08 kg/m  Body mass index is 20.08 kg/m. Wt Readings from Last 3 Encounters:  11/25/16 117 lb (53.1 kg)  07/10/16 114 lb (51.7 kg)  06/03/16 116 lb 12.8 oz (53 kg)   Constitutional: overweight, in NAD Eyes: PERRLA, EOMI, no exophthalmos ENT: moist mucous membranes, no thyromegaly, no cervical lymphadenopathy Cardiovascular: RRR, No MRG Respiratory: CTA B Gastrointestinal: abdomen soft, NT, ND, BS+ Musculoskeletal: no  deformities, strength intact in all 4 Skin: moist, warm, no rashes Neurological: no tremor with outstretched hands, DTR normal in all 4   ASSESSMENT: 1. DM2, non-insulin-dependent, uncontrolled, with complications - PN - on Neurontin  2. PN  - more burning recently  PLAN:  1. Patient with long-standing, mild DM, with slightly better control since last visit, despite increased stress at work. She is more mindful about her diet and continues to stay active (walks). - no changes needed in her meds now >> refilled -  I suggested to:   Patient Instructions  Please continue  - Metformin ER 500 mg 2x a day with meals. - Januvia 100 mg in am, before b'fast.  Please return in 4 months with your sugar log.   - today, HbA1c is 6.7% (better!) - continue checking sugars at different times of the day - check 1-2x a day, rotating checks - advised for yearly eye exams >> she needs one >> needs to schedule - advised for flu shot >> she is UTD - Return to clinic in 3 mo with sugar log   2. Diabetic PN - at last visit I suggested to add: - alpha-lipoic acid 600 mg twice a day >> not started yet - B complex 1 tablet once a day >> she is on this   Carlus Pavlov, MD PhD [facility] Endocrinology

## 2016-11-25 NOTE — Addendum Note (Signed)
Addended by: Darene LamerHOMPSON, Sarthak Rubenstein T on: 11/25/2016 03:37 PM   Modules accepted: Orders

## 2016-12-30 ENCOUNTER — Other Ambulatory Visit: Payer: Self-pay | Admitting: Family Medicine

## 2016-12-30 DIAGNOSIS — E034 Atrophy of thyroid (acquired): Secondary | ICD-10-CM

## 2016-12-30 DIAGNOSIS — E7800 Pure hypercholesterolemia, unspecified: Secondary | ICD-10-CM

## 2016-12-30 DIAGNOSIS — I10 Essential (primary) hypertension: Secondary | ICD-10-CM

## 2016-12-30 NOTE — Telephone Encounter (Signed)
Lab Results  Component Value Date   TSH 1.960 07/23/2016

## 2017-01-02 ENCOUNTER — Ambulatory Visit (INDEPENDENT_AMBULATORY_CARE_PROVIDER_SITE_OTHER): Payer: BLUE CROSS/BLUE SHIELD | Admitting: Family Medicine

## 2017-01-02 ENCOUNTER — Encounter: Payer: Self-pay | Admitting: Family Medicine

## 2017-01-02 VITALS — BP 123/71 | HR 55 | Temp 97.5°F | Resp 16 | Ht 64.0 in | Wt 115.2 lb

## 2017-01-02 DIAGNOSIS — E034 Atrophy of thyroid (acquired): Secondary | ICD-10-CM | POA: Diagnosis not present

## 2017-01-02 DIAGNOSIS — Z23 Encounter for immunization: Secondary | ICD-10-CM | POA: Diagnosis not present

## 2017-01-02 DIAGNOSIS — Z720 Tobacco use: Secondary | ICD-10-CM

## 2017-01-02 DIAGNOSIS — E119 Type 2 diabetes mellitus without complications: Secondary | ICD-10-CM | POA: Diagnosis not present

## 2017-01-02 DIAGNOSIS — E7800 Pure hypercholesterolemia, unspecified: Secondary | ICD-10-CM | POA: Diagnosis not present

## 2017-01-02 MED ORDER — GABAPENTIN 100 MG PO CAPS: 100.0000 mg | ORAL_CAPSULE | Freq: Every day | ORAL | 3 refills | Status: DC

## 2017-01-02 MED ORDER — ATORVASTATIN CALCIUM 40 MG PO TABS: 40.0000 mg | ORAL_TABLET | Freq: Every day | ORAL | 3 refills | Status: DC

## 2017-01-02 MED ORDER — LISINOPRIL 5 MG PO TABS: 5.0000 mg | ORAL_TABLET | Freq: Every day | ORAL | 3 refills | Status: DC

## 2017-01-02 MED ORDER — LEVOTHYROXINE SODIUM 50 MCG PO TABS: 50.0000 ug | ORAL_TABLET | Freq: Every day | ORAL | 3 refills | Status: DC

## 2017-01-02 NOTE — Assessment & Plan Note (Signed)
Pt cutting down Using nicotine replacement Work environment has limited pt's ability to smoke which is helping

## 2017-01-02 NOTE — Assessment & Plan Note (Signed)
Thyroid levels good tsh at goal Refilled meds today

## 2017-01-02 NOTE — Assessment & Plan Note (Addendum)
Diabetes well controlled on duo therapy januvia + metformin Discussed ada diet Eye exam  Regular exercise On lisinopril 5mg  for renal protection Baby asa advised Also continue lipitor

## 2017-01-02 NOTE — Assessment & Plan Note (Addendum)
Pt with family history of stroke in her father and heart attack in her sister. Continue fish oil and lipitor. Add baby asa Smoking cessation advised

## 2017-01-02 NOTE — Patient Instructions (Addendum)
IF you received an x-ray today, you will receive an invoice from [facility] Radiology. Please contact [facility] Radiology at [phone] with questions or concerns regarding your invoice.   IF you received labwork today, you will receive an invoice from [place]. Please contact [facility] at [phone] with questions or concerns regarding your invoice.   Our billing staff will not be able to assist you with questions regarding bills from these companies.  You will be contacted with the lab results as soon as they are available. The fastest way to get your results is to activate your My Chart account. Instructions are located on the last page of this paperwork. If you have not heard from [place] regarding the results in 2 weeks, please contact this office.    We recommend that you schedule a mammogram for breast cancer screening. Typically, you do not need a referral to do this. Please contact a local imaging center to schedule your mammogram.  [facility] - [phone]  *ask for the Radiology Department [facility] ([place]) - [phone] or [phone]  [facility] - [phone] [place] [phone] [facility] - [phone]  *ask for the [facility] - [phone]  *ask for the Radiology Department [facility] - [phone]  *ask for the Mammography Department [facility] - [phone] Diabetes Mellitus and Standards of Medical Care Managing diabetes (diabetes mellitus) can be complicated. Your diabetes treatment may be managed by a team of health care providers, including:  A diet and nutrition specialist (registered dietitian).  A nurse.  A certified diabetes educator (CDE).  A diabetes specialist (endocrinologist).  An eye doctor.  A primary care provider.  A dentist.  Your health care providers follow a schedule in order to help you  get the best quality of care. The following schedule is a general guideline for your diabetes management plan. Your health care providers may also give you more specific instructions. HbA1c ( hemoglobin A1c) test This test provides information about blood sugar (glucose) control over the previous 2-3 months. It is used to check whether your diabetes management plan needs to be adjusted.  If you are meeting your treatment goals, this test is done at least 2 times a year.  If you are not meeting treatment goals or if your treatment goals have changed, this test is done 4 times a year.  Blood pressure test  This test is done at every routine medical visit. For most people, the goal is less than 130/80. Ask your health care provider what your goal blood pressure should be. Dental and eye exams  Visit your dentist two times a year.  If you have type 1 diabetes, get an eye exam 3-5 years after you are diagnosed, and then once a year after your first exam. ? If you were diagnosed with type 1 diabetes as a child, get an eye exam when you are age 3 or older and have had diabetes for 3-5 years. After the first exam, you should get an eye exam once a year.  If you have type 2 diabetes, have an eye exam as soon as you are diagnosed, and then once a year after your first exam. Foot care exam  Visual foot exams are done at every routine medical visit. The exams check for cuts, bruises, redness, blisters, sores, or other problems with the feet.  A complete foot exam is done by  your health care provider once a year. This exam includes an inspection of the structure and skin of your feet, and a check of the pulses and sensation in your feet. ? Type 1 diabetes: Get your first exam 3-5 years after diagnosis. ? Type 2 diabetes: Get your first exam as soon as you are diagnosed.  Check your feet every day for cuts, bruises, redness, blisters, or sores. If you have any of these or other problems that are not  healing, contact your health care provider. Kidney function test ( urine microalbumin)  This test is done once a year. ? Type 1 diabetes: Get your first test 5 years after diagnosis. ? Type 2 diabetes: Get your first test as soon as you are diagnosed.  If you have chronic kidney disease (CKD), get a serum creatinine and estimated glomerular filtration rate (eGFR) test once a year. Lipid profile (cholesterol, HDL, LDL, triglycerides)  This test should be done when you are diagnosed with diabetes, and every 5 years after the first test. If you are on medicines to lower your cholesterol, you may need to get this test done every year. ? The goal for LDL is less than 100 mg/dL (5.5 mmol/L). If you are at high risk, the goal is less than 70 mg/dL (3.9 mmol/L). ? The goal for HDL is 40 mg/dL (2.2 mmol/L) for men and 50 mg/dL(2.8 mmol/L) for women. An HDL cholesterol of 60 mg/dL (3.3 mmol/L) or higher gives some protection against heart disease. ? The goal for triglycerides is less than 150 mg/dL (8.3 mmol/L). Immunizations  The yearly flu (influenza) vaccine is recommended for everyone 6 months or older who has diabetes.  The pneumonia (pneumococcal) vaccine is recommended for everyone 2 years or older who has diabetes. If you are 61 or older, you may get the pneumonia vaccine as a series of two separate shots.  The hepatitis B vaccine is recommended for adults shortly after they have been diagnosed with diabetes.  The Tdap (tetanus, diphtheria, and pertussis) vaccine should be given: ? According to normal childhood vaccination schedules, for children. ? Every 10 years, for adults who have diabetes.  The shingles vaccine is recommended for people who have had chicken pox and are 50 years or older. Mental and emotional health  Screening for symptoms of eating disorders, anxiety, and depression is recommended at the time of diagnosis and afterward as needed. If your screening shows that you have  symptoms (you have a positive screening result), you may need further evaluation and be referred to a mental health care provider. Diabetes self-management education  Education about how to manage your diabetes is recommended at diagnosis and ongoing as needed. Treatment plan  Your treatment plan will be reviewed at every medical visit. Summary  Managing diabetes (diabetes mellitus) can be complicated. Your diabetes treatment may be managed by a team of health care providers.  Your health care providers follow a schedule in order to help you get the best quality of care.  Standards of care including having regular physical exams, blood tests, blood pressure monitoring, immunizations, screening tests, and education about how to manage your diabetes.  Your health care providers may also give you more specific instructions based on your individual health. This information is not intended to replace advice given to you by your health care provider. Make sure you discuss any questions you have with your health care provider. Document Released: 04/27/2009 Document Revised: 03/28/2016 Document Reviewed: 03/28/2016 Elsevier Interactive Patient Education  2018  [employer].

## 2017-01-02 NOTE — Progress Notes (Signed)
Chief Complaint  Patient presents with  . Medication Refill    lipitor, synthroid, metformin, januvia    HPI  Diabetes Mellitus: Patient presents for follow up of diabetes. Symptoms: hypoglycemia  and paresthesia of the feet. Symptoms have rare episodes. Patient denies hyperglycemia, increase appetite, nausea, visual disturbances and vomitting.  Evaluation to date has been included: hemoglobin A1C.  Home sugars: BGs consistently in an acceptable range. Treatment to date: Continued metformin which has been effective and Januvia has been helping substantially.  Lab Results  Component Value Date   HGBA1C 6.7 11/25/2016    Thyroid: Patient presents for evaluation of hypothyroidism. Current symptoms include denies fatigue, weight changes, heat/cold intolerance, bowel/skin changes or CVS symptoms. Patient denies denies fatigue, weight changes, heat/cold intolerance, bowel/skin changes or CVS symptoms.    Lab Results  Component Value Date   TSH 1.960 07/23/2016   Dyslipidemia: Patient presents for evaluation of lipids.  Compliance with treatment thus far has been excellent.  A repeat fasting lipid profile was done.  The patient does not use medications that may worsen dyslipidemias (corticosteroids, progestins, anabolic steroids, diuretics, beta-blockers, amiodarone, cyclosporine, olanzapine). The patient exercises daily.  The patient is not known to have coexisting coronary artery disease.   Cardiac Risk Factors Age > 45-female, > 55-female:  YES  +1  Smoking:   YES  +1  Sig. family hx of CHD*:  YES  +1  Hypertension:   NO  Diabetes:   YES  +1  HDL < 35:   NO  HDL > 59:   NO  Total: 4  *- Sig. family h/o CHD per NCEP = MI or sudden death at <55yo in  father or other 1st-degree female relative, or <65yo in mother or  other 1st-degree female relative   Lab Results  Component Value Date   CHOL 276 (H) 07/10/2016   HDL 40 07/10/2016   LDLCALC 201 (H) 07/10/2016   TRIG 176 (H)  07/10/2016   CHOLHDL 6.9 (H) 07/10/2016   Lab Results  Component Value Date   CREATININE 0.68 07/10/2016     Past Medical History:  Diagnosis Date  . Allergy   . Diabetes mellitus   . DM type 2 (diabetes mellitus, type 2) (HCC)   . Hyperlipidemia   . Hypertension   . Hypothyroidism     Current Outpatient Prescriptions  Medication Sig Dispense Refill  . atorvastatin (LIPITOR) 40 MG tablet Take 1 tablet (40 mg total) by mouth daily. 90 tablet 3  . fish oil-omega-3 fatty acids 1000 MG capsule Take 2 g by mouth daily.    [name] gabapentin (NEURONTIN) 100 MG capsule Take 1 capsule (100 mg total) by mouth at bedtime. 90 capsule 3  . levothyroxine (SYNTHROID, LEVOTHROID) 50 MCG tablet Take 1 tablet (50 mcg total) by mouth daily. 90 tablet 3  . lisinopril (PRINIVIL,ZESTRIL) 5 MG tablet Take 1 tablet (5 mg total) by mouth daily. 90 tablet 3  . metFORMIN (GLUCOPHAGE-XR) 500 MG 24 hr tablet TAKE 1 TABLET BY MOUTH TWICE DAILY WITH MEALS AS DIRECTED 180 tablet 3  . sitaGLIPtin (JANUVIA) 100 MG tablet Take 1 tablet (100 mg total) by mouth daily. 90 tablet 3   No current facility-administered medications for this visit.     Allergies:  Allergies  Allergen Reactions  . Aspirin     Burns stomach  . Codeine     rash    Past Surgical History:  Procedure Laterality Date  . ABDOMINAL HYSTERECTOMY  1995  . STAPEDECTOMY  Right ear    Social History   Social History  . Marital status: Married    Spouse name: N/A  . Number of children: N/A  . Years of education: 65   Occupational History  . property asst. manager [employer]   Social History Main Topics  . Smoking status: Current Every Day Smoker    Packs/day: 0.50    Years: 10.00    Types: Cigarettes  . Smokeless tobacco: Never Used     Comment: pt states thinking of quitting  . Alcohol use No  . Drug use: No  . Sexual activity: Yes    Birth control/ protection: None     Comment: 1 sexual partner in last  year   Other Topics Concern  . None   Social History Narrative   Exercise: walking, 4 times/week for 30 minutes.    Review of Systems  Constitutional: Negative for chills and fever.  Eyes: Negative for blurred vision and double vision.  Cardiovascular: Negative for chest pain and palpitations.  Gastrointestinal: Negative for abdominal pain, blood in stool, constipation and diarrhea.  Genitourinary: Negative for dysuria, frequency and urgency.  Neurological: Negative for dizziness and headaches.  Endo/Heme/Allergies: Negative for polydipsia.   See  hpi  Objective: Vitals:   01/02/17 0829  BP: 123/71  Pulse: (!) 55  Resp: 16  Temp: 97.5 F (36.4 C)  TempSrc: Oral  SpO2: 97%  Weight: 115 lb 3.2 oz (52.3 kg)  Height: 5\' 4"  (1.626 m)    Physical Exam General: alert, oriented, in NAD Head: normocephalic, atraumatic, no sinus tenderness Eyes: EOM intact, no scleral icterus or conjunctival injection Ears: TM clear bilaterally Nose: mucosa nonerythematous, nonedematous Throat: no pharyngeal exudate or erythema Neck: supple, no thyromegaly Lymph: no posterior auricular, submental or cervical lymph adenopathy Heart: normal rate, normal sinus rhythm, no murmurs Lungs: clear to auscultation bilaterally, no wheezing Reflexes: patellar reflex 2+ bilaterally  Assessment and Plan Carmen Anderson was seen today for medication refill.  Diagnoses and all orders for this visit:  Well controlled type 2 diabetes mellitus (HCC) -     Comprehensive metabolic panel -     lisinopril (PRINIVIL,ZESTRIL) 5 MG tablet; Take 1 tablet (5 mg total) by mouth daily. -     Pneumococcal polysaccharide vaccine 23-valent greater than or equal to 2yo subcutaneous/IM  Hypercholesteremia -     atorvastatin (LIPITOR) 40 MG tablet; Take 1 tablet (40 mg total) by mouth daily. -     Lipid panel -     Comprehensive metabolic panel  Hypothyroidism due to acquired atrophy of thyroid -     levothyroxine (SYNTHROID,  LEVOTHROID) 50 MCG tablet; Take 1 tablet (50 mcg total) by mouth daily. -     Comprehensive metabolic panel -     TSH  Other orders -     gabapentin (NEURONTIN) 100 MG capsule; Take 1 capsule (100 mg total) by mouth at bedtime. -     Discontinue: lisinopril (PRINIVIL,ZESTRIL) 5 MG tablet; Take 1 tablet (5 mg total) by mouth daily.   Problem List Items Addressed This Visit      Endocrine   Hypothyroid    Thyroid levels good tsh at goal Refilled meds today      Relevant Medications   levothyroxine (SYNTHROID, LEVOTHROID) 50 MCG tablet   Other Relevant Orders   Comprehensive metabolic panel   TSH   Well controlled type 2 diabetes mellitus (HCC) - Primary    Diabetes well controlled on duo therapy januvia +  metformin Discussed ada diet Eye exam  Regular exercise On lisinopril 5mg  for renal protection Baby asa advised Also continue lipitor      Relevant Medications   atorvastatin (LIPITOR) 40 MG tablet   lisinopril (PRINIVIL,ZESTRIL) 5 MG tablet   Other Relevant Orders   Comprehensive metabolic panel   Pneumococcal polysaccharide vaccine 23-valent greater than or equal to 2yo subcutaneous/IM     Other   Hypercholesteremia    Pt with family history of stroke in her father and heart attack in her sister. Continue fish oil and lipitor. Add baby asa Smoking cessation advised      Relevant Medications   atorvastatin (LIPITOR) 40 MG tablet   lisinopril (PRINIVIL,ZESTRIL) 5 MG tablet   Other Relevant Orders   Lipid panel   Comprehensive metabolic panel   Tobacco user    Pt cutting down Using nicotine replacement Work environment has limited pt's ability to smoke which is helping           Retail buyerZoe A Laylah Anderson

## 2017-01-03 LAB — COMPREHENSIVE METABOLIC PANEL
ALT: 12 IU/L (ref 0–32)
AST: 15 IU/L (ref 0–40)
Albumin/Globulin Ratio: 1.8 (ref 1.2–2.2)
Albumin: 4.6 g/dL (ref 3.5–5.5)
Alkaline Phosphatase: 58 IU/L (ref 39–117)
BUN/Creatinine Ratio: 17 (ref 9–23)
BUN: 12 mg/dL (ref 6–24)
Bilirubin Total: 0.3 mg/dL (ref 0.0–1.2)
CO2: 22 mmol/L (ref 20–29)
Calcium: 10.1 mg/dL (ref 8.7–10.2)
Chloride: 102 mmol/L (ref 96–106)
Creatinine, Ser: 0.7 mg/dL (ref 0.57–1.00)
GFR calc Af Amer: 113 mL/min/{1.73_m2} (ref >59)
GFR calc non Af Amer: 98 mL/min/{1.73_m2} (ref >59)
Globulin, Total: 2.5 g/dL (ref 1.5–4.5)
Glucose: 129 mg/dL — ABNORMAL HIGH (ref 65–99)
Potassium: 4.9 mmol/L (ref 3.5–5.2)
Sodium: 140 mmol/L (ref 134–144)
Total Protein: 7.1 g/dL (ref 6.0–8.5)

## 2017-01-03 LAB — LIPID PANEL
Chol/HDL Ratio: 3.9 ratio (ref 0.0–4.4)
Cholesterol, Total: 168 mg/dL (ref 100–199)
HDL: 43 mg/dL (ref >39)
LDL Calculated: 98 mg/dL (ref 0–99)
Triglycerides: 137 mg/dL (ref 0–149)
VLDL Cholesterol Cal: 27 mg/dL (ref 5–40)

## 2017-01-03 LAB — TSH: TSH: 2.08 u[IU]/mL (ref 0.450–4.500)

## 2017-01-03 LAB — PLEASE NOTE

## 2017-03-27 ENCOUNTER — Ambulatory Visit: Payer: BLUE CROSS/BLUE SHIELD | Admitting: Internal Medicine

## 2017-04-13 ENCOUNTER — Ambulatory Visit (INDEPENDENT_AMBULATORY_CARE_PROVIDER_SITE_OTHER): Payer: BLUE CROSS/BLUE SHIELD | Admitting: Internal Medicine

## 2017-04-13 ENCOUNTER — Encounter: Payer: Self-pay | Admitting: Internal Medicine

## 2017-04-13 VITALS — BP 124/68 | HR 65 | Wt 118.0 lb

## 2017-04-13 DIAGNOSIS — E785 Hyperlipidemia, unspecified: Secondary | ICD-10-CM

## 2017-04-13 DIAGNOSIS — E1142 Type 2 diabetes mellitus with diabetic polyneuropathy: Secondary | ICD-10-CM | POA: Diagnosis not present

## 2017-04-13 DIAGNOSIS — E1140 Type 2 diabetes mellitus with diabetic neuropathy, unspecified: Secondary | ICD-10-CM | POA: Diagnosis not present

## 2017-04-13 LAB — POCT GLYCOSYLATED HEMOGLOBIN (HGB A1C): Hemoglobin A1C: 6.8

## 2017-04-13 NOTE — Addendum Note (Signed)
Addended by: Darene Lamer T on: 04/13/2017 09:34 AM   Modules accepted: Orders

## 2017-04-13 NOTE — Patient Instructions (Signed)
Please continue  - Metformin ER 500 mg 2x a day with meals. - Januvia 100 mg in am, before b'fast.  Please return in 4 months with your sugar log.

## 2017-04-13 NOTE — Progress Notes (Signed)
Patient ID: Carmen Anderson, female   DOB: Mar 16, 1961, 56 y.o.   MRN: [id number]  HPI: Carmen Anderson is a 56 y.o.-year-old female, initially referred by her PCP, Olean Ree, NP, for management of DM2, dx in 2001-2002, non-insulin-dependent, uncontrolled, with complications (PN). Last visit 5 mo ago.  Last hemoglobin A1c was: Lab Results  Component Value Date   HGBA1C 6.7 11/25/2016   HGBA1C 7.0 06/03/2016   HGBA1C 7.0 03/04/2016   Pt is on a regimen of: - Metformin ER 500 mg 2x a day, with meals (decreased from 3x a day Spring 2016) - tolerates this well - Januvia 100 mg daily in am (09/2015)  Pt checks her sugars 2xx a day and they are: - am: 87-101, 156 >> 70-121 >> 70-128, 260 x1 >> 86-121, 128 - 2h after b'fast:90-160 >> 110-165, 181 >> 115-169 >> 132-179 - before lunch: n/c >> 68, 77-137 >> 89-102 >> 77-99 >> 76-125 >> 86-110 - 2h after lunch: 97-168 >> 139-170, 178 >> 125-154, 170, 200 >> 148-174 - before dinner:  90-101 >> 79-100, 120 >> 75-122, 140 >> 87-128, 136 - 2h after dinner: 129-166 >> 103-177, 196 >> 128-172, 180 >> 150-190 - bedtime: n/c >> 89-150, 197 >> 99-121 >> 101-178 >> 99-140 >>95-122, 160 - nighttime: n/c >> n/c Lowest sugar was 38 ... >> 86; she has ypoglycemia awareness in the 50s.  Highest sugar was 260 x1 >> 190.  Glucometer: FreeStyle >> [facility]  Pt's meals are: - Breakfast: PB sandwich on wheatbread or wheat muffin w/ 1 egg - Lunch: salads or [place] sandwich on wheat bread - Dinner: soups, salad, meat 2x a weekly (chicken) + veggies - Snacks: pineapple, celery  - No CKD, last BUN/creatinine:  Lab Results  Component Value Date   BUN 12 01/02/2017   CREATININE 0.70 01/02/2017  On Lisinopril. - last set of lipids: Lab Results  Component Value Date   CHOL 168 01/02/2017   HDL 43 01/02/2017   LDLCALC 98 01/02/2017   TRIG 137 01/02/2017   CHOLHDL 3.9 01/02/2017  On Lipitor, fish oil. - last eye exam was in 07/2015- [facility]. No  DR - she has numbness and tingling in her feet. On Neurontin 100 mg capsule at bedtime. I recommended the following combination for neuropathy >> started, helps some: - alpha-lipoic acid 600 mg 2x a day  - B complex 1 tablet once a day  She has HTN, HL, hypothyroidism (on LT4 50 mcg). Lab Results  Component Value Date   TSH 2.080 01/02/2017    ROS: Constitutional: no weight gain/no weight loss, no fatigue, no subjective hyperthermia, no subjective hypothermia Eyes: no blurry vision, no xerophthalmia ENT: no sore throat, no nodules palpated in throat, no dysphagia, no odynophagia, no hoarseness Cardiovascular: no CP/no SOB/no palpitations/no leg swelling Respiratory: no cough/no SOB/no wheezing Gastrointestinal: no N/no V/no D/no C/no acid reflux Musculoskeletal: no muscle aches/no joint aches Skin: no rashes, no hair loss Neurological: no tremors/+ numbness/+ tingling/no dizziness  I reviewed pt's medications, allergies, PMH, social hx, family hx, and changes were documented in the history of present illness. Otherwise, unchanged from my initial visit note.   Past Medical History:  Diagnosis Date  . Allergy   . Diabetes mellitus   . DM type 2 (diabetes mellitus, type 2) (HCC)   . Hyperlipidemia   . Hypertension   . Hypothyroidism    Past Surgical History:  Procedure Laterality Date  . ABDOMINAL HYSTERECTOMY  1995  . STAPEDECTOMY  Right ear   Social History   Social History  . Marital Status: Married    Spouse Name: N/A  . Number of Children: 0  . Years of Education: 12   Occupational History  . property asst. Production designer, theatre/television/film    Social History Main Topics  . Smoking status: Current Every Day Smoker -- 0.50 packs/day for 10 years    Types: Cigarettes  . Smokeless tobacco: Not on file     Comment: pt states thinking of quitting  . Alcohol Use: No  . Drug Use: No   Social History Narrative   Exercise: walking, 4 times/week for 30 minutes.   Current Outpatient  Prescriptions on File Prior to Visit  Medication Sig Dispense Refill  . atorvastatin (LIPITOR) 40 MG tablet Take 1 tablet (40 mg total) by mouth daily. 90 tablet 3  . fish oil-omega-3 fatty acids 1000 MG capsule Take 2 g by mouth daily.    [name] gabapentin (NEURONTIN) 100 MG capsule Take 1 capsule (100 mg total) by mouth at bedtime. 90 capsule 3  . levothyroxine (SYNTHROID, LEVOTHROID) 50 MCG tablet Take 1 tablet (50 mcg total) by mouth daily. 90 tablet 3  . lisinopril (PRINIVIL,ZESTRIL) 5 MG tablet Take 1 tablet (5 mg total) by mouth daily. 90 tablet 3  . metFORMIN (GLUCOPHAGE-XR) 500 MG 24 hr tablet TAKE 1 TABLET BY MOUTH TWICE DAILY WITH MEALS AS DIRECTED 180 tablet 3  . sitaGLIPtin (JANUVIA) 100 MG tablet Take 1 tablet (100 mg total) by mouth daily. 90 tablet 3   No current facility-administered medications on file prior to visit.    Allergies  Allergen Reactions  . Aspirin     Burns stomach  . Codeine     rash   Family History  Problem Relation Age of Onset  . Diabetes Mother   . Heart disease Mother   . Stroke Father        2011  . Hyperlipidemia Father   . Diabetes Sister   . Heart disease Sister   . Heart disease Brother        congenital   PE: BP 124/68 (BP Location: Left Arm, Patient Position: Sitting)   Pulse 65   Wt 118 lb (53.5 kg)   SpO2 98%   BMI 20.25 kg/m  Body mass index is 20.25 kg/m. Wt Readings from Last 3 Encounters:  04/13/17 118 lb (53.5 kg)  01/02/17 115 lb 3.2 oz (52.3 kg)  11/25/16 117 lb (53.1 kg)   Constitutional: normal weight, in NAD Eyes: PERRLA, EOMI, no exophthalmos ENT: moist mucous membranes, no thyromegaly, no cervical lymphadenopathy Cardiovascular: RRR, No MRG Respiratory: CTA B Gastrointestinal: abdomen soft, NT, ND, BS+ Musculoskeletal: no deformities, strength intact in all 4 Skin: moist, warm, no rashes Neurological: no tremor with outstretched hands, DTR normal in all 4  ASSESSMENT: 1. DM2, non-insulin-dependent,  uncontrolled, with complications - PN - on Neurontin  2. PN   3. HL  PLAN:  1. Patient with long-standing, Mild type 2 diabetes, with stable control since last visit. She continues to be more mindful about her diet (Her husband is prediabetic and they are both improving their diet) and continues to stay active (walking).  - I suggested to:  Patient Instructions  Please continue  - Metformin ER 500 mg 2x a day with meals. - Januvia 100 mg in am, before b'fast.  Please return in 4 months with your sugar log.   - today, HbA1c is 6.8% (stable, at goal) - continue checking sugars  at different times of the day - check 1x a day, rotating checks - again advised to schedule her yearly eye exam >> she needs one - will have flu shot at work - Return to clinic in 4 mo with sugar log     2. Diabetic PN - on: - alpha-lipoic acid 600 mg twice a day >> not started yet - B complex 1 tablet once a day >> she is on this  3. HL - Lipid panel 12/2016 reviewed >> MUCH better!  - Continue statin  Carlus Pavlov, MD PhD [facility]

## 2017-04-29 DIAGNOSIS — Z23 Encounter for immunization: Secondary | ICD-10-CM | POA: Diagnosis not present

## 2017-07-15 DIAGNOSIS — E119 Type 2 diabetes mellitus without complications: Secondary | ICD-10-CM | POA: Diagnosis not present

## 2017-07-15 DIAGNOSIS — H40033 Anatomical narrow angle, bilateral: Secondary | ICD-10-CM | POA: Diagnosis not present

## 2017-07-17 DIAGNOSIS — H43393 Other vitreous opacities, bilateral: Secondary | ICD-10-CM | POA: Diagnosis not present

## 2017-07-17 LAB — HM DIABETES EYE EXAM

## 2017-08-14 ENCOUNTER — Ambulatory Visit: Payer: BLUE CROSS/BLUE SHIELD | Admitting: Internal Medicine

## 2017-08-14 ENCOUNTER — Encounter: Payer: Self-pay | Admitting: Internal Medicine

## 2017-08-14 VITALS — BP 132/68 | HR 69 | Ht 64.0 in | Wt 119.4 lb

## 2017-08-14 DIAGNOSIS — E1142 Type 2 diabetes mellitus with diabetic polyneuropathy: Secondary | ICD-10-CM | POA: Diagnosis not present

## 2017-08-14 DIAGNOSIS — E785 Hyperlipidemia, unspecified: Secondary | ICD-10-CM

## 2017-08-14 DIAGNOSIS — E1140 Type 2 diabetes mellitus with diabetic neuropathy, unspecified: Secondary | ICD-10-CM | POA: Diagnosis not present

## 2017-08-14 LAB — POCT GLYCOSYLATED HEMOGLOBIN (HGB A1C): Hemoglobin A1C: 7

## 2017-08-14 NOTE — Patient Instructions (Signed)
Please continue  - Metformin ER 500 mg 2x a day with meals - Januvia 100 mg in am, before breakfast.  Please return in 4 months with your sugar log.

## 2017-08-14 NOTE — Progress Notes (Signed)
Patient ID: Carmen Anderson, female   DOB: 08-13-1960, 57 y.o.   MRN: [id number]  HPI: Carmen Anderson is a 57 y.o.-year-old female, initially referred by her PCP, Carmen Ree, NP, for management of DM2, dx in 2001-2002, non-insulin-dependent, now more controlled, with complications (PN). Last visit 4 months ago.  She has more stress at work lately. She is contemplating changing her job.  Last hemoglobin A1c was: Lab Results  Component Value Date   HGBA1C 6.8 04/13/2017   HGBA1C 6.7 11/25/2016   HGBA1C 7.0 06/03/2016   Pt is on a regimen of: - Metformin ER 500 mg 2x a day, with meals (decreased from 3x a day Spring 2016) -tolerates this well - Januvia 100 mg daily in am  Pt checks her sugars 2x a day: - am:  70-128, 260 x1 >> 86-121, 128 >> 76-98, 127 - 2h after b'fast: 115-169 >> 132-179 >> 86, 130-174, 192 - before lunch:77-99 >> 76-125 >> 86-110 >> 79-99, 119 - 2h after lunch: 125-154, 170, 200 >> 148-174 >> 144-173 - before dinner: 75-122, 140 >> 87-128, 136 >> 86-120 - 2h after dinner: 128-172, 180 >> 150-190 >> 149-179 - bedtime: 99-140 >>95-122, 160 >> 98-125, 178 - nighttime: n/c >> n/c Lowest sugar was 38 ... >> 86 >> 76; she has hypoglycemia awareness in the 50s. Highest sugar was 260 x1 >> 190 >> 192.  Glucometer: FreeStyle >> [facility]  Pt's meals are: - Breakfast: PB sandwich on wheatbread or wheat muffin w/ 1 egg - Lunch: salads or [place] sandwich on wheat bread - Dinner: soups, salad, meat 2x a weekly (chicken) + veggies - Snacks: pineapple, celery  -No CKD, last BUN/creatinine:  Lab Results  Component Value Date   BUN 12 01/02/2017   CREATININE 0.70 01/02/2017  On lisinopril. -+ HL; last set of lipids: Lab Results  Component Value Date   CHOL 168 01/02/2017   HDL 43 01/02/2017   LDLCALC 98 01/02/2017   TRIG 137 01/02/2017   CHOLHDL 3.9 01/02/2017  On Lipitor, fish oil. - last eye exam was in [facility].  07/2017: No DR -+ Numbness and tingling in  her feet.  On Neurontin 100 mg capsule at bedtime ontin 100 mg capsule at bedtime. I recommended the following for neuropathy >> started, helps some: - - B complex 1 tablet once a day  She has HTN, hypothyroidism (on levothyroxine 50 mcg daily).  Last TSH normal: Lab Results  Component Value Date   TSH 2.080 01/02/2017    ROS: Constitutional: no weight gain/no weight loss, no fatigue, no subjective hyperthermia, no subjective hypothermia Eyes: no blurry vision, no xerophthalmia ENT: no sore throat, no nodules palpated in throat, no dysphagia, no odynophagia, no hoarseness Cardiovascular: no CP/no SOB/no palpitations/no leg swelling Respiratory: no cough/no SOB/no wheezing Gastrointestinal: no N/no V/no D/no C/no acid reflux Musculoskeletal: no muscle aches/no joint aches Skin: no rashes, no hair loss Neurological: no tremors/ + numbness and tingling/no dizziness  I reviewed pt's medications, allergies, PMH, social hx, family hx, and changes were documented in the history of present illness. Otherwise, unchanged from my initial visit note.  Past Medical History:  Diagnosis Date  . Allergy   . Diabetes mellitus   . DM type 2 (diabetes mellitus, type 2) (HCC)   . Hyperlipidemia   . Hypertension   . Hypothyroidism    Past Surgical History:  Procedure Laterality Date  . ABDOMINAL HYSTERECTOMY  1995  . STAPEDECTOMY     Right ear  Social History   Social History  . Marital Status: Married    Spouse Name: N/A  . Number of Children: 0  . Years of Education: 12   Occupational History  . property asst. Production designer, theatre/television/filmmanager    Social History Main Topics  . Smoking status: Current Every Day Smoker -- 0.50 packs/day for 10 years    Types: Cigarettes  . Smokeless tobacco: Not on file     Comment: pt states thinking of quitting  . Alcohol Use: No  . Drug Use: No   Social History Narrative   Exercise: walking, 4 times/week for 30 minutes.   Current Outpatient Medications on File Prior  to Visit  Medication Sig Dispense Refill  . atorvastatin (LIPITOR) 40 MG tablet Take 1 tablet (40 mg total) by mouth daily. 90 tablet 3  . fish oil-omega-3 fatty acids 1000 MG capsule Take 2 g by mouth daily.    [name]. gabapentin (NEURONTIN) 100 MG capsule Take 1 capsule (100 mg total) by mouth at bedtime. 90 capsule 3  . levothyroxine (SYNTHROID, LEVOTHROID) 50 MCG tablet Take 1 tablet (50 mcg total) by mouth daily. 90 tablet 3  . lisinopril (PRINIVIL,ZESTRIL) 5 MG tablet Take 1 tablet (5 mg total) by mouth daily. 90 tablet 3  . metFORMIN (GLUCOPHAGE-XR) 500 MG 24 hr tablet TAKE 1 TABLET BY MOUTH TWICE DAILY WITH MEALS AS DIRECTED 180 tablet 3  . sitaGLIPtin (JANUVIA) 100 MG tablet Take 1 tablet (100 mg total) by mouth daily. 90 tablet 3   No current facility-administered medications on file prior to visit.    Allergies  Allergen Reactions  . Aspirin     Burns stomach  . Codeine     rash   Family History  Problem Relation Age of Onset  . Diabetes Mother   . Heart disease Mother   . Stroke Father        2011  . Hyperlipidemia Father   . Diabetes Sister   . Heart disease Sister   . Heart disease Brother        congenital   PE: BP 132/68   Pulse 69   Ht 5\' 4"  (1.626 m)   Wt 119 lb 6.4 oz (54.2 kg)   SpO2 98%   BMI 20.49 kg/m  Body mass index is 20.49 kg/m. Wt Readings from Last 3 Encounters:  08/14/17 119 lb 6.4 oz (54.2 kg)  04/13/17 118 lb (53.5 kg)  01/02/17 115 lb 3.2 oz (52.3 kg)   Constitutional: Normal weight, in NAD Eyes: PERRLA, EOMI, no exophthalmos ENT: moist mucous membranes, no thyromegaly, no cervical lymphadenopathy Cardiovascular: RRR, No MRG Respiratory: CTA B Gastrointestinal: abdomen soft, NT, ND, BS+ Musculoskeletal: no deformities, strength intact in all 4 Skin: moist, warm, no rashes Neurological: no tremor with outstretched hands, DTR normal in all 4  ASSESSMENT: 1. DM2, non-insulin-dependent, now more controlled, with complications - PN   2.  PN   3. HL  PLAN:  1. Patient with long-standing, mild, type 2 diabetes, more controlled in the last year.  At last visit, she was telling me that she was more mindful about her diet as her husband was also prediabetic and they were both improving their diet, and she was also walking for exercise. As sugars were at goal, we did not change her regimen at that time. - At this visit, sugars are very similar to before, w/o lows or severe highs - no need to change regimen for now - I suggested to:  Patient Instructions  Please continue  - Metformin ER 500 mg 2x a day with meals - Januvia 100 mg in am, before breakfast.  Please return in 4 months with your sugar log.   - today, HbA1c is 7% (slightly higher) - continue checking sugars at different times of the day - check 1x a day, rotating checks - advised for yearly eye exams >> she is UTD - UTD with flu shot - Return to clinic in 4 mo with sugar log     2. Diabetic PN - No new complaints - She continues on: - B complex 1 tablet once a day - Neurontin 100 mg daily  3. HL - Per review of the 12/2016 lipid panel: LDL is much improved - Continue Lipitor, fish oil  Carlus Pavlov, MD PhD [facility] Endocrin[facility]

## 2017-08-14 NOTE — Addendum Note (Signed)
Addended by: Yolande JollyLAWSON, Loranda Mastel on: 08/14/2017 04:21 PM   Modules accepted: Orders

## 2017-09-01 ENCOUNTER — Ambulatory Visit: Payer: BLUE CROSS/BLUE SHIELD | Admitting: Podiatry

## 2017-09-01 ENCOUNTER — Ambulatory Visit (INDEPENDENT_AMBULATORY_CARE_PROVIDER_SITE_OTHER): Payer: BLUE CROSS/BLUE SHIELD

## 2017-09-01 DIAGNOSIS — M722 Plantar fascial fibromatosis: Secondary | ICD-10-CM | POA: Diagnosis not present

## 2017-09-01 MED ORDER — METHYLPREDNISOLONE 4 MG PO TBPK: ORAL_TABLET | ORAL | 0 refills | Status: DC

## 2017-09-01 MED ORDER — MELOXICAM 15 MG PO TABS: 15.0000 mg | ORAL_TABLET | Freq: Every day | ORAL | 3 refills | Status: DC

## 2017-09-01 NOTE — Patient Instructions (Signed)

## 2017-09-01 NOTE — Progress Notes (Signed)
Subjective:  Patient ID: Carmen Anderson, female    DOB: 29-Sep-1960,  MRN: [id number] HPI Chief Complaint  Patient presents with  . Foot Pain    R. bottom heel pain x 3 weeks; 7/10 sharp pain Tx: OTC inserts ( helped a little.)    57 y.o. female presents with the above complaint.     Past Medical History:  Diagnosis Date  . Allergy   . Diabetes mellitus   . DM type 2 (diabetes mellitus, type 2) (HCC)   . Hyperlipidemia   . Hypertension   . Hypothyroidism    Past Surgical History:  Procedure Laterality Date  . ABDOMINAL HYSTERECTOMY  1995  . STAPEDECTOMY     Right ear    Current Outpatient Medications:  .  atorvastatin (LIPITOR) 40 MG tablet, Take 1 tablet (40 mg total) by mouth daily., Disp: 90 tablet, Rfl: 3 .  fish oil-omega-3 fatty acids 1000 MG capsule, Take 2 g by mouth daily., Disp: , Rfl:  .  gabapentin (NEURONTIN) 100 MG capsule, Take 1 capsule (100 mg total) by mouth at bedtime., Disp: 90 capsule, Rfl: 3 .  levothyroxine (SYNTHROID, LEVOTHROID) 50 MCG tablet, Take 1 tablet (50 mcg total) by mouth daily., Disp: 90 tablet, Rfl: 3 .  lisinopril (PRINIVIL,ZESTRIL) 5 MG tablet, Take 1 tablet (5 mg total) by mouth daily., Disp: 90 tablet, Rfl: 3 .  metFORMIN (GLUCOPHAGE-XR) 500 MG 24 hr tablet, TAKE 1 TABLET BY MOUTH TWICE DAILY WITH MEALS AS DIRECTED, Disp: 180 tablet, Rfl: 3 .  sitaGLIPtin (JANUVIA) 100 MG tablet, Take 1 tablet (100 mg total) by mouth daily., Disp: 90 tablet, Rfl: 3  Allergies  Allergen Reactions  . Aspirin     Burns stomach  . Codeine     rash   Review of Systems  All other systems reviewed and are negative.  Objective:  There were no vitals filed for this visit.  General: Well developed, nourished, in no acute distress, alert and oriented x3   Dermatological: Skin is warm, dry and supple bilateral. Nails x 10 are well maintained; remaining integument appears unremarkable at this time. There are no open sores, no preulcerative lesions, no  rash or signs of infection present.  Vascular: Dorsalis Pedis artery and Posterior Tibial artery pedal pulses are 2/4 bilateral with immedate capillary fill time. Pedal hair growth present. No varicosities and no lower extremity edema present bilateral.   Neruologic: Grossly intact via light touch bilateral. Vibratory intact via tuning fork bilateral. Protective threshold with Semmes Wienstein monofilament intact to all pedal sites bilateral. Patellar and Achilles deep tendon reflexes 2+ bilateral. No Babinski or clonus noted bilateral.   Musculoskeletal: No gross boney pedal deformities bilateral. No pain, crepitus, or limitation noted with foot and ankle range of motion bilateral. Muscular strength 5/5 in all groups tested bilateral.  Gait: Unassisted, Nonantalgic.    Radiographs:  Radiographs of the right foot were taken today 3 views demonstrate an osseously mature individual.  No acute findings are noted.  There is she does have soft tissue increase in density at the plantar fascial calcaneal insertion site.  Assessment & Plan:   Assessment: Plantar fasciitis right foot.  Plan: Discussed etiology pathology conservative versus surgical therapies.  At this point after sterile Betadine skin prep and verbal consent I injected the medial aspect of the right heel with 20 mg of Kenalog 5 mg Marcaine to the point of maximal tenderness.  Start her on a Medrol Dosepak to be followed by meloxicam.  Plantar fascial brace and a night splint were dispensed.  Discussed appropriate shoe gear stretching exercises ice therapy as your modifications.  She was provided with both oral and written home-going instructions stretching.     Clifford Coudriet T. BremenHyatt, North DakotaDPM

## 2017-09-04 ENCOUNTER — Other Ambulatory Visit: Payer: Self-pay

## 2017-09-04 ENCOUNTER — Emergency Department (HOSPITAL_COMMUNITY): Payer: BLUE CROSS/BLUE SHIELD

## 2017-09-04 ENCOUNTER — Encounter (HOSPITAL_COMMUNITY): Payer: Self-pay | Admitting: *Deleted

## 2017-09-04 ENCOUNTER — Inpatient Hospital Stay (HOSPITAL_COMMUNITY)
Admission: EM | Admit: 2017-09-04 | Discharge: 2017-09-08 | DRG: 247 | Disposition: A | Discharge status: Home / Self Care | Payer: BLUE CROSS/BLUE SHIELD | Attending: Cardiovascular Disease | Admitting: Cardiovascular Disease

## 2017-09-04 DIAGNOSIS — E039 Hypothyroidism, unspecified: Secondary | ICD-10-CM | POA: Diagnosis not present

## 2017-09-04 DIAGNOSIS — R9439 Abnormal result of other cardiovascular function study: Secondary | ICD-10-CM | POA: Diagnosis not present

## 2017-09-04 DIAGNOSIS — D72829 Elevated white blood cell count, unspecified: Secondary | ICD-10-CM | POA: Diagnosis present

## 2017-09-04 DIAGNOSIS — Z8249 Family history of ischemic heart disease and other diseases of the circulatory system: Secondary | ICD-10-CM

## 2017-09-04 DIAGNOSIS — E7849 Other hyperlipidemia: Secondary | ICD-10-CM | POA: Diagnosis not present

## 2017-09-04 DIAGNOSIS — I252 Old myocardial infarction: Secondary | ICD-10-CM | POA: Diagnosis not present

## 2017-09-04 DIAGNOSIS — E1142 Type 2 diabetes mellitus with diabetic polyneuropathy: Secondary | ICD-10-CM | POA: Diagnosis present

## 2017-09-04 DIAGNOSIS — R001 Bradycardia, unspecified: Secondary | ICD-10-CM | POA: Diagnosis present

## 2017-09-04 DIAGNOSIS — I10 Essential (primary) hypertension: Secondary | ICD-10-CM | POA: Diagnosis present

## 2017-09-04 DIAGNOSIS — E7800 Pure hypercholesterolemia, unspecified: Secondary | ICD-10-CM | POA: Diagnosis not present

## 2017-09-04 DIAGNOSIS — Z823 Family history of stroke: Secondary | ICD-10-CM

## 2017-09-04 DIAGNOSIS — Z79890 Hormone replacement therapy (postmenopausal): Secondary | ICD-10-CM

## 2017-09-04 DIAGNOSIS — E785 Hyperlipidemia, unspecified: Secondary | ICD-10-CM | POA: Diagnosis present

## 2017-09-04 DIAGNOSIS — Z79899 Other long term (current) drug therapy: Secondary | ICD-10-CM | POA: Diagnosis not present

## 2017-09-04 DIAGNOSIS — R079 Chest pain, unspecified: Secondary | ICD-10-CM | POA: Diagnosis not present

## 2017-09-04 DIAGNOSIS — Z90710 Acquired absence of both cervix and uterus: Secondary | ICD-10-CM | POA: Diagnosis not present

## 2017-09-04 DIAGNOSIS — I2583 Coronary atherosclerosis due to lipid rich plaque: Secondary | ICD-10-CM | POA: Diagnosis not present

## 2017-09-04 DIAGNOSIS — Z87891 Personal history of nicotine dependence: Secondary | ICD-10-CM | POA: Diagnosis not present

## 2017-09-04 DIAGNOSIS — R778 Other specified abnormalities of plasma proteins: Secondary | ICD-10-CM

## 2017-09-04 DIAGNOSIS — I340 Nonrheumatic mitral (valve) insufficiency: Secondary | ICD-10-CM | POA: Diagnosis not present

## 2017-09-04 DIAGNOSIS — I2510 Atherosclerotic heart disease of native coronary artery without angina pectoris: Secondary | ICD-10-CM | POA: Diagnosis not present

## 2017-09-04 DIAGNOSIS — F17210 Nicotine dependence, cigarettes, uncomplicated: Secondary | ICD-10-CM | POA: Diagnosis present

## 2017-09-04 DIAGNOSIS — Z886 Allergy status to analgesic agent status: Secondary | ICD-10-CM | POA: Diagnosis not present

## 2017-09-04 DIAGNOSIS — Z955 Presence of coronary angioplasty implant and graft: Secondary | ICD-10-CM

## 2017-09-04 DIAGNOSIS — E1140 Type 2 diabetes mellitus with diabetic neuropathy, unspecified: Secondary | ICD-10-CM | POA: Diagnosis present

## 2017-09-04 DIAGNOSIS — R7989 Other specified abnormal findings of blood chemistry: Secondary | ICD-10-CM

## 2017-09-04 DIAGNOSIS — E119 Type 2 diabetes mellitus without complications: Secondary | ICD-10-CM

## 2017-09-04 DIAGNOSIS — R748 Abnormal levels of other serum enzymes: Secondary | ICD-10-CM | POA: Diagnosis not present

## 2017-09-04 DIAGNOSIS — Z833 Family history of diabetes mellitus: Secondary | ICD-10-CM

## 2017-09-04 DIAGNOSIS — M722 Plantar fascial fibromatosis: Secondary | ICD-10-CM | POA: Diagnosis not present

## 2017-09-04 DIAGNOSIS — Z885 Allergy status to narcotic agent status: Secondary | ICD-10-CM | POA: Diagnosis not present

## 2017-09-04 DIAGNOSIS — Z7984 Long term (current) use of oral hypoglycemic drugs: Secondary | ICD-10-CM

## 2017-09-04 DIAGNOSIS — I214 Non-ST elevation (NSTEMI) myocardial infarction: Secondary | ICD-10-CM | POA: Diagnosis not present

## 2017-09-04 DIAGNOSIS — R0600 Dyspnea, unspecified: Secondary | ICD-10-CM | POA: Diagnosis not present

## 2017-09-04 DIAGNOSIS — Z8349 Family history of other endocrine, nutritional and metabolic diseases: Secondary | ICD-10-CM | POA: Diagnosis not present

## 2017-09-04 HISTORY — DX: Personal history of other diseases of the digestive system: Z87.19

## 2017-09-04 LAB — I-STAT TROPONIN, ED
Troponin i, poc: 0.02 ng/mL (ref 0.00–0.08)
Troponin i, poc: 0.28 ng/mL (ref 0.00–0.08)

## 2017-09-04 LAB — BASIC METABOLIC PANEL
Anion gap: 14 (ref 5–15)
BUN: 13 mg/dL (ref 6–20)
CO2: 19 mmol/L — ABNORMAL LOW (ref 22–32)
Calcium: 9.8 mg/dL (ref 8.9–10.3)
Chloride: 103 mmol/L (ref 101–111)
Creatinine, Ser: 0.76 mg/dL (ref 0.44–1.00)
GFR calc Af Amer: >60 mL/min (ref >60)
GFR calc non Af Amer: >60 mL/min (ref >60)
Glucose, Bld: 165 mg/dL — ABNORMAL HIGH (ref 65–99)
Potassium: 4.1 mmol/L (ref 3.5–5.1)
Sodium: 136 mmol/L (ref 135–145)

## 2017-09-04 LAB — CBC
HCT: 44 % (ref 36.0–46.0)
Hemoglobin: 14.6 g/dL (ref 12.0–15.0)
MCH: 30.8 pg (ref 26.0–34.0)
MCHC: 33.2 g/dL (ref 30.0–36.0)
MCV: 92.8 fL (ref 78.0–100.0)
Platelets: 330 10*3/uL (ref 150–400)
RBC: 4.74 MIL/uL (ref 3.87–5.11)
RDW: 13.5 % (ref 11.5–15.5)
WBC: 13.2 10*3/uL — ABNORMAL HIGH (ref 4.0–10.5)

## 2017-09-04 LAB — TROPONIN I
Troponin I: 0.69 ng/mL (ref <0.03)
Troponin I: 0.74 ng/mL (ref <0.03)

## 2017-09-04 LAB — HEPARIN LEVEL (UNFRACTIONATED): Heparin Unfractionated: 0.47 IU/mL (ref 0.30–0.70)

## 2017-09-04 MED ORDER — NITROGLYCERIN IN D5W 200-5 MCG/ML-% IV SOLN: 0.0000 ug/min | INTRAVENOUS | Status: DC

## 2017-09-04 MED ORDER — ONDANSETRON HCL 4 MG/2ML IJ SOLN
4.0000 mg | Freq: Four times a day (QID) | INTRAMUSCULAR | Status: DC | PRN
Start: 2017-09-04 — End: 2017-09-08

## 2017-09-04 MED ORDER — HEPARIN BOLUS VIA INFUSION
4000.0000 [IU] | Freq: Once | INTRAVENOUS | Status: AC
  Administered 2017-09-04: 4000 [IU] via INTRAVENOUS
  Filled 2017-09-04: qty 4000

## 2017-09-04 MED ORDER — ATORVASTATIN CALCIUM 40 MG PO TABS: 40.0000 mg | ORAL_TABLET | Freq: Every day | ORAL | Status: DC

## 2017-09-04 MED ORDER — ASPIRIN 300 MG RE SUPP
300.0000 mg | RECTAL | Status: AC
  Filled 2017-09-04: qty 1

## 2017-09-04 MED ORDER — ASPIRIN EC 81 MG PO TBEC
81.0000 mg | DELAYED_RELEASE_TABLET | Freq: Every day | ORAL | Status: DC
  Administered 2017-09-05 – 2017-09-08 (×3): 81 mg via ORAL
  Filled 2017-09-04 (×3): qty 1

## 2017-09-04 MED ORDER — METHYLPREDNISOLONE 4 MG PO TBPK
ORAL_TABLET | Freq: Every day | ORAL | Status: DC
  Filled 2017-09-04 (×4): qty 21

## 2017-09-04 MED ORDER — METHYLPREDNISOLONE 16 MG PO TABS
16.0000 mg | ORAL_TABLET | Freq: Every day | ORAL | Status: AC
  Administered 2017-09-04: 16 mg via ORAL
  Filled 2017-09-04: qty 1

## 2017-09-04 MED ORDER — GABAPENTIN 100 MG PO CAPS
100.0000 mg | ORAL_CAPSULE | Freq: Every day | ORAL | Status: DC
  Administered 2017-09-04 – 2017-09-07 (×4): 100 mg via ORAL
  Filled 2017-09-04 (×4): qty 1

## 2017-09-04 MED ORDER — METHYLPREDNISOLONE 4 MG PO TABS
12.0000 mg | ORAL_TABLET | Freq: Every day | ORAL | Status: AC
  Administered 2017-09-05: 12 mg via ORAL
  Filled 2017-09-04: qty 3

## 2017-09-04 MED ORDER — ACETAMINOPHEN 325 MG PO TABS: 650.0000 mg | ORAL_TABLET | ORAL | Status: DC | PRN

## 2017-09-04 MED ORDER — NITROGLYCERIN 0.4 MG SL SUBL
0.4000 mg | SUBLINGUAL_TABLET | SUBLINGUAL | Status: DC | PRN
Start: 2017-09-04 — End: 2017-09-08

## 2017-09-04 MED ORDER — LEVOTHYROXINE SODIUM 50 MCG PO TABS
50.0000 ug | ORAL_TABLET | Freq: Every day | ORAL | Status: DC
  Administered 2017-09-05 – 2017-09-08 (×4): 50 ug via ORAL
  Filled 2017-09-04 (×4): qty 1

## 2017-09-04 MED ORDER — METHYLPREDNISOLONE 4 MG PO TABS
4.0000 mg | ORAL_TABLET | Freq: Every day | ORAL | Status: AC
  Administered 2017-09-07: 4 mg via ORAL
  Filled 2017-09-04: qty 1

## 2017-09-04 MED ORDER — HEPARIN (PORCINE) IN NACL 100-0.45 UNIT/ML-% IJ SOLN
800.0000 [IU]/h | INTRAMUSCULAR | Status: DC
  Administered 2017-09-04 – 2017-09-05 (×2): 700 [IU]/h via INTRAVENOUS
  Administered 2017-09-07: 800 [IU]/h via INTRAVENOUS
  Filled 2017-09-04 (×3): qty 250

## 2017-09-04 MED ORDER — ASPIRIN 81 MG PO CHEW
324.0000 mg | CHEWABLE_TABLET | ORAL | Status: AC
  Administered 2017-09-04: 324 mg via ORAL
  Filled 2017-09-04: qty 4

## 2017-09-04 MED ORDER — METHYLPREDNISOLONE 4 MG PO TABS
8.0000 mg | ORAL_TABLET | Freq: Every day | ORAL | Status: AC
  Administered 2017-09-06: 8 mg via ORAL
  Filled 2017-09-04: qty 2

## 2017-09-04 MED ORDER — METHYLPREDNISOLONE 4 MG PO TBPK: ORAL_TABLET | Freq: Every day | ORAL | Status: DC

## 2017-09-04 MED ORDER — HYDRALAZINE HCL 20 MG/ML IJ SOLN: 2.0000 mg | Freq: Four times a day (QID) | INTRAMUSCULAR | Status: DC | PRN

## 2017-09-04 NOTE — Progress Notes (Signed)
ANTICOAGULATION CONSULT NOTE - Initial Consult  Pharmacy Consult for Heparin Indication: chest pain/ACS  Allergies  Allergen Reactions  . Aspirin Other (See Comments)    Burns stomach  . Codeine Rash    Patient Measurements: Height: 5\' 3"  (160 cm) Weight: 118 lb (53.5 kg) IBW/kg (Calculated) : 52.4  Vital Signs: Temp: 98.5 F (36.9 C) (02/22 0732) Temp Source: Oral (02/22 0732) BP: 123/59 (02/22 1415) Pulse Rate: 59 (02/22 1415)  Labs: Recent Labs    09/04/17 0732  HGB 14.6  HCT 44.0  PLT 330  CREATININE 0.76    Estimated Creatinine Clearance: 65 mL/min (by C-G formula based on SCr of 0.76 mg/dL).   Medical History: Past Medical History:  Diagnosis Date  . Allergy   . Diabetes mellitus   . DM type 2 (diabetes mellitus, type 2) (HCC)   . Hyperlipidemia   . Hypertension   . Hypothyroidism     Assessment: 57yof to begin heparin for chest pain with plan for stress test tomorrow morning. No anticoagulants pta. Baseline labs wnl.  Goal of Therapy:  Heparin level 0.3-0.7 units/ml Monitor platelets by anticoagulation protocol: Yes   Plan:  1) Heparin bolus 4000 units x 1 2) Heparin drip 700 units/hr 3) 6 hour heparin level 4) Daily heparin level and CBC  Fredrik RiggerMarkle, Fredrika Canby Sue 09/04/2017,2:47 PM

## 2017-09-04 NOTE — H&P (Addendum)
Cardiology Admission History and Physical:   Patient ID: Carmen Anderson; MRN: [id number]; DOB: 05/05/1961   Admission date: 09/04/2017  Primary Care Provider: Doristine BosworthStallings, Zoe A, MD Primary Cardiologist: [facility], Dr. Tresa EndoKelly  Primary Electrophysiologist:  None  Chief Complaint:  Chest pain  Patient Profile:   Carmen Anderson is a 57 y.o. female with a PMH of Type 2 DM, HLD, ?HTN, and hypothyroidism who [facility] with complaints of chest pain which started around 6am 09/04/17.  History of Present Illness:   Carmen Anderson is a 57 y.o. female with a PMH of Type 2 DM, HLD, ?HTN (patient denies), and hypothyroidism who [facility] with complaints of chest pain which started around 6am 09/04/17. Patient states she was in her usual state of health until this morning. While getting ready for work this AM around Ken[place]6am, she experienced mild chest discomfort. Approximately 30 minutes later when putting her things in her car, her chest pain increased. She experienced left sided chest pressure with pain radiating to her left arm and associated SOB. She states the pain lasted 2-3 minutes and was relieved with rest. Given that she had never experienced chest pressure like this before she decided to present to the ED for further evaluation. On arrival to the ED she was CP free. She states she did have another episode around 12:30 which she associated with increased activity getting on/off a bed pan. Again the episode lasted 2-3 minutes and was associated with L arm pain and SOB. She denies associated diaphoresis, nausea, vomiting, lightheadedness, dizziness, pre-syncope, or syncope with either episode. Prior to today, she denies experiencing chest pain, SOB, DOE, orthopnea, PND, or LE edema. She is fairly active, able to walk any distance without CP or SOB, and able to walk up multiple flights of stairs without symptoms. She denies recent illness, fever, abdominal pain, nausea, vomiting, diarrhea,  difficulty urinating, hematuria, melena, hematochezia. She had an exercise stress test 6-8 years ago as part of a routine evaluation which was normal.   ED course: afebrile, hypertensive on arrival (max 166/80) now improved, mild bradycardia, satting well on RA. Labs notable for electrolytes wnl, Cr 0.76, WBC 13.2, Hgb 14.6, PLT 330, Trop 0.02>0.28. EKG with NSR no STE/D, no TWI. Patient declined ASA given GI upset with non-coated ASA. Cardiology asked to see for further work-up.    Past Medical History:  Diagnosis Date  . Allergy   . Diabetes mellitus   . DM type 2 (diabetes mellitus, type 2) (HCC)   . Hyperlipidemia   . Hypertension   . Hypothyroidism     Past Surgical History:  Procedure Laterality Date  . ABDOMINAL HYSTERECTOMY  1995  . STAPEDECTOMY     Right ear     Medications Prior to Admission: Prior to Admission medications   Medication Sig Start Date End Date Taking? Authorizing Provider  atorvastatin (LIPITOR) 40 MG tablet Take 1 tablet (40 mg total) by mouth daily. 01/02/17  Yes Collie SiadStallings, Zoe A, MD  gabapentin (NEURONTIN) 100 MG capsule Take 1 capsule (100 mg total) by mouth at bedtime. 01/02/17  Yes Stallings, Zoe A, MD  levothyroxine (SYNTHROID, LEVOTHROID) 50 MCG tablet Take 1 tablet (50 mcg total) by mouth daily. 01/02/17  Yes Stallings, Zoe A, MD  lisinopril (PRINIVIL,ZESTRIL) 5 MG tablet Take 1 tablet (5 mg total) by mouth daily. 01/02/17  Yes Stallings, Zoe A, MD  meloxicam (MOBIC) 15 MG tablet Take 1 tablet (15 mg total) by mouth  daily. 09/01/17  Yes Hyatt, Max T, DPM  metFORMIN (GLUCOPHAGE-XR) 500 MG 24 hr tablet TAKE 1 TABLET BY MOUTH TWICE DAILY WITH MEALS AS DIRECTED 11/25/16  Yes Carlus Pavlov, MD  methylPREDNISolone (MEDROL DOSEPAK) 4 MG TBPK tablet 6 day dose pack - take as directed 09/01/17  Yes Hyatt, Max T, DPM  sitaGLIPtin (JANUVIA) 100 MG tablet Take 1 tablet (100 mg total) by mouth daily. 11/25/16  Yes Carlus Pavlov, MD     Allergies:    Allergies    Allergen Reactions  . Aspirin Other (See Comments)    Burns stomach  . Codeine Rash    Social History:   Social History   Socioeconomic History  . Marital status: Married    Spouse name: Not on file  . Number of children: Not on file  . Years of education: 10  . Highest education level: Not on file  Social Needs  . Financial resource strain: Not on file  . Food insecurity - worry: Not on file  . Food insecurity - inability: Not on file  . Transportation needs - medical: Not on file  . Transportation needs - non-medical: Not on file  Occupational History  . Occupation: property asst. Event organiser: [employer]  Tobacco Use  . Smoking status: Current Every Day Smoker    Packs/day: 0.50    Years: 10.00    Pack years: 5.00    Types: Cigarettes  . Smokeless tobacco: Never Used  . Tobacco comment: pt states thinking of quitting  Substance and Sexual Activity  . Alcohol use: No  . Drug use: No  . Sexual activity: Yes    Birth control/protection: None    Comment: 1 sexual partner in last year  Other Topics Concern  . Not on file  Social History Narrative   Exercise: walking, 4 times/week for 30 minutes.    Family History:   The patient's family history includes Diabetes in her mother and sister; Heart disease in her brother, mother, and sister; Hyperlipidemia in her father; Stroke in her father.    ROS:  Please see the history of present illness.  All other ROS reviewed and negative.     Physical Exam/Data:   Vitals:   09/04/17 1130 09/04/17 1145 09/04/17 1200 09/04/17 1215  BP: 118/67 130/68 127/66 117/65  Pulse: (!) 59 (!) 57 61 (!) 54  Resp: 14 12 17 15   Temp:      TempSrc:      SpO2: 98% 98% 98% 99%  Weight:      Height:       No intake or output data in the 24 hours ending 09/04/17 1338 Filed Weights   09/04/17 0730  Weight: 118 lb (53.5 kg)   Body mass index is 20.9 kg/m.  General:  Well nourished, well developed, in no  acute distress HEENT: MMM, sclera anicteric Neck: no JVD Vascular: No carotid bruits; distal pulses 2+ bilaterally   Cardiac:  normal S1, S2; RRR; no murmur, gallops, or rubs Lungs:  clear to auscultation bilaterally, no wheezing, rhonchi or rales  Abd: soft, nontender, no hepatomegaly  Ext: no edema Musculoskeletal:  No deformities, BUE and BLE strength normal and equal Skin: warm and dry  Neuro:  CNs 2-12 intact, no focal abnormalities noted Psych:  Normal affect    EKG: serial EKGs without NSR no STE/D, no TWI   Relevant CV Studies: None  Laboratory Data:  Chemistry Recent Labs  Lab 09/04/17 0732  NA  136  K 4.1  CL 103  CO2 19*  GLUCOSE 165*  BUN 13  CREATININE 0.76  CALCIUM 9.8  GFRNONAA >60  GFRAA >60  ANIONGAP 14    No results for input(s): PROT, ALBUMIN, AST, ALT, ALKPHOS, BILITOT in the last 168 hours. Hematology Recent Labs  Lab 09/04/17 0732  WBC 13.2*  RBC 4.74  HGB 14.6  HCT 44.0  MCV 92.8  MCH 30.8  MCHC 33.2  RDW 13.5  PLT 330   Cardiac EnzymesNo results for input(s): TROPONINI in the last 168 hours.  Recent Labs  Lab 09/04/17 0741 09/04/17 1056  TROPIPOC 0.02 0.28*    BNPNo results for input(s): BNP, PROBNP in the last 168 hours.  DDimer No results for input(s): DDIMER in the last 168 hours.  Radiology/Studies:  Dg Chest 2 View  Result Date: 09/04/2017 CLINICAL DATA:  Chest pain EXAM: CHEST  2 VIEW COMPARISON:  None. FINDINGS: Heart and mediastinal contours are within normal limits. No focal opacities or effusions. No acute bony abnormality. IMPRESSION: No active cardiopulmonary disease. Electronically Signed   By: Charlett Nose M.D.   On: 09/04/2017 08:00    Assessment and Plan:   1. Chest pain: patient with complaints of left sided chest pressure radiating to her left arm, associated with SOB. Pain is exacerbated by activity and relieved with rest. Cardiac risk factors include: DM, HTN, HLD, and tobacco abuse. Remote exercise  stress test 6-8 years ago was normal. No history of cardiac cath or echocardiogram. Troponin 0.02>0.28. EKG without ischemic changes. - Continue to trend troponin to peak - Continue serial EKGs - Continue to monitor on telemetry - Start heparin gtt for possible ACS - PRN SL nitro - Will obtain echocardiogram to evaluate LVEF, valvular function, and for any wall motion abnormalities. - Will plan for exercise stress test in AM  2. HTN: patient denies history of HTN, however has been documented on multiple outpatient notes. Also with BP routinely with SBP of 130 while on lisinopril. BP currently stable - Hold lisinopril for now to limit nephrotoxic agents in the event that stress test is positive - Continue to monitor BP closely - prn IV hydral for SBP >160  3. Type 2 DM: Last HgbA1C 7 (08/14/17) - goal A1C <7 - Will hold metformin and januvia while inpatient - can resume at discharge - ISS coverage for now  4. Dyslipidemia: LDL 98 (12/2016) - Recheck fasting lipid panel in AM - goal LDL <70 - Continue atorvastatin  5. Hypothyroidism: TSH 2.080 (12/2016) - Recheck TSH in AM - Continue home levothyroxine   6. Plantar fasciitis: seen by podiatry 09/01/17 and started on mobic and methylprednisolone (6 day dose pack).  - Hold mobic  - Will order steroids to complete dose pack  7. Mild leukocytosis: patient without infectious complaints. Likely 2/2 steroid for plantar fasciitis.  Severity of Illness: The appropriate patient status for this patient is OBSERVATION. Observation status is judged to be reasonable and necessary in order to provide the required intensity of service to ensure the patient's safety. The patient's presenting symptoms, physical exam findings, and initial radiographic and laboratory data in the context of their medical condition is felt to place them at decreased risk for further clinical deterioration. Furthermore, it is anticipated that the patient will be medically  stable for discharge from the hospital within 2 midnights of admission. The following factors support the patient status of observation.   " The patient's presenting symptoms include chest pain radiating to  left arm with associated SOB. " The physical exam findings include normal exam. " The initial radiographic and laboratory data are Troponin 0.02>0.28, EKG non-ischemic.     For questions or updates, please contact [facility] Please consult [email or web address] for contact info under Cardiology/STEMI.    Signed, Beatriz Stallion, PA-C  09/04/2017 1:38 PM    Patient seen and examined. Agree with assessment and plan.  Carmen Anderson is a very pleasant 57 year old female who has a significant cardiac risk factor profile notable for type 2 diabetes mellitus for approximately 16 years, hyperlipidemia, questionable hypertension, and strong family history for coronary artery disease.  She also has a history of hypothyroidism and has a history of tobacco use.  She had smoked for approximately 10 years, quit for 5 years, and has been smoking again for another 10 years.  Prior to today.  She denies any change in exertional capacity.  She denies any episodes of chest pain.  This morning, she experienced a short episode of chest tightness which lasted a proximally 1-2 minutes.  She again had a second episode while walking to her car, which was more intense, associated with left arm radiation and shortness of breath.  The second episode lasted a aproximatey 3 minutes.  She was brought to the emergency room.  In the ER while she was going from a supine to sitting position and lifting herself up while by holding onto the hand reveals she can experience another short episode of chest discomfort which lasted ~ 2 minutes.  At present, she is chest pain-free.  Lug pressure is stable at 120/70.  Pulses 55-60.  She appears slightly older than stated age.  HEENT is unremarkable.  Carotids are equal bilaterally without  bruits.  JVD.  Her lungs were without wheezing or rhonchi.  There was no chest wall tenderness to palpation.  Rhythm was regular with no S3 gallop.  Abdomen was soft and nontender.  Pulses were 2+.  There was no edema, clubbing or cyanosis.  Her ECG essentially is unremarkable and shows sinus rhythm at 56.  Chemistry is normal.  Point care troponin is 0.28.  Hemoglobin and hematocrit are stable.  Platelets are normal.  Carmen Anderson has significant cardiac risk factors including tobacco, diabetes mellitus, hyperlipidemia, and strong family history for CAD.  Her chest tightness is worrisome for potential ischemia, particularly with her left arm radiation and shortness of breath.  However, prior to today.  She had never experienced any other episodes and each episode today lasted only 1-2 minutes.   Recommend serial troponins, follow-up ECG.  The patient will require an ischemic evaluation.  Will heparinize.  Depending upon her clinical evolution and subsequent laboratory, consider nuclear stress testing versus definitive cardiac catheterization.  Lennette Bihari, MD, [facility] 09/04/2017 4:24 PM

## 2017-09-04 NOTE — ED Notes (Signed)
Edp aware of chest pain

## 2017-09-04 NOTE — ED Provider Notes (Signed)
[facility] Provider Note   CSN: [id number] Arrival date & time: 09/04/17  [id number]     History   Chief Complaint Chief Complaint  Patient presents with  . Chest Pain    HPI Carmen Anderson is a 57 y.o. female.  Anterior chest pain described as heaviness since 6 AM with radiation to the left arm.  Review of systems positive for mild dyspnea, but no diaphoresis or nausea.  Cardiac risk factors include type 2 diabetes, hypercholesterolemia, cigarette smoking.  No hypertension.  Severity of symptoms is moderate.  Nothing makes symptoms better or worse.      Past Medical History:  Diagnosis Date  . Allergy   . Diabetes mellitus   . DM type 2 (diabetes mellitus, type 2) (HCC)   . Hyperlipidemia   . Hypertension   . Hypothyroidism     Patient Active Problem List   Diagnosis Date Noted  . Diabetic peripheral neuropathy associated with type 2 diabetes mellitus (HCC) 04/13/2017  . Type 2 diabetes mellitus with diabetic neuropathy, without long-term current use of insulin (HCC) 09/25/2015  . Tobacco user 10/21/2012  . Hyperlipidemia   . Allergic rhinitis   . Hypothyroid     Past Surgical History:  Procedure Laterality Date  . ABDOMINAL HYSTERECTOMY  1995  . STAPEDECTOMY     Right ear    OB History    No data available       Home Medications    Prior to Admission medications   Medication Sig Start Date End Date Taking? Authorizing Provider  atorvastatin (LIPITOR) 40 MG tablet Take 1 tablet (40 mg total) by mouth daily. 01/02/17  Yes Collie SiadStallings, Zoe A, MD  gabapentin (NEURONTIN) 100 MG capsule Take 1 capsule (100 mg total) by mouth at bedtime. 01/02/17  Yes Stallings, Zoe A, MD  levothyroxine (SYNTHROID, LEVOTHROID) 50 MCG tablet Take 1 tablet (50 mcg total) by mouth daily. 01/02/17  Yes Stallings, Zoe A, MD  lisinopril (PRINIVIL,ZESTRIL) 5 MG tablet Take 1 tablet (5 mg total) by mouth daily. 01/02/17  Yes Collie SiadStallings, Zoe A, MD    meloxicam (MOBIC) 15 MG tablet Take 1 tablet (15 mg total) by mouth daily. 09/01/17  Yes Hyatt, Max T, DPM  metFORMIN (GLUCOPHAGE-XR) 500 MG 24 hr tablet TAKE 1 TABLET BY MOUTH TWICE DAILY WITH MEALS AS DIRECTED 11/25/16  Yes Carlus PavlovGherghe, Cristina, MD  methylPREDNISolone (MEDROL DOSEPAK) 4 MG TBPK tablet 6 day dose pack - take as directed 09/01/17  Yes Hyatt, Max T, DPM  sitaGLIPtin (JANUVIA) 100 MG tablet Take 1 tablet (100 mg total) by mouth daily. 11/25/16  Yes Carlus PavlovGherghe, Cristina, MD    Family History Family History  Problem Relation Age of Onset  . Diabetes Mother   . Heart disease Mother   . Stroke Father        2011  . Hyperlipidemia Father   . Diabetes Sister   . Heart disease Sister   . Heart disease Brother        congenital    Social History Social History   Tobacco Use  . Smoking status: Current Every Day Smoker    Packs/day: 0.50    Years: 10.00    Pack years: 5.00    Types: Cigarettes  . Smokeless tobacco: Never Used  . Tobacco comment: pt states thinking of quitting  Substance Use Topics  . Alcohol use: No  . Drug use: No     Allergies   Aspirin and Codeine  Review of Systems Review of Systems  All other systems reviewed and are negative.    Physical Exam Updated Vital Signs BP 135/62   Pulse 61   Temp 98.5 F (36.9 C) (Oral)   Resp 15   Ht 5\' 3"  (1.6 m)   Wt 53.5 kg (118 lb)   SpO2 98%   BMI 20.90 kg/m   Physical Exam  Constitutional: She is oriented to person, place, and time. She appears well-developed and well-nourished.  HENT:  Head: Normocephalic and atraumatic.  Eyes: Conjunctivae are normal.  Neck: Neck supple.  Cardiovascular: Normal rate and regular rhythm.  Pulmonary/Chest: Effort normal and breath sounds normal.  Abdominal: Soft. Bowel sounds are normal.  Musculoskeletal: Normal range of motion.  Neurological: She is alert and oriented to person, place, and time.  Skin: Skin is warm and dry.  Psychiatric: She has a normal  mood and affect. Her behavior is normal.  Nursing note and vitals reviewed.    ED Treatments / Results  Labs (all labs ordered are listed, but only abnormal results are displayed) Labs Reviewed  BASIC METABOLIC PANEL - Abnormal; Notable for the following components:      Result Value   CO2 19 (*)    Glucose, Bld 165 (*)    All other components within normal limits  CBC - Abnormal; Notable for the following components:   WBC 13.2 (*)    All other components within normal limits  I-STAT TROPONIN, ED - Abnormal; Notable for the following components:   Troponin i, poc 0.28 (*)    All other components within normal limits  I-STAT TROPONIN, ED    EKG  EKG Interpretation  Date/Time:  Friday September 04 2017 07:26:49 EST Ventricular Rate:  63 PR Interval:  150 QRS Duration: 82 QT Interval:  404 QTC Calculation: 413 R Axis:   47 Text Interpretation:  Normal sinus rhythm Normal ECG Confirmed by Donnetta Hutching ([id number]) on 09/04/2017 11:08:49 AM       Radiology Dg Chest 2 View  Result Date: 09/04/2017 CLINICAL DATA:  Chest pain EXAM: CHEST  2 VIEW COMPARISON:  None. FINDINGS: Heart and mediastinal contours are within normal limits. No focal opacities or effusions. No acute bony abnormality. IMPRESSION: No active cardiopulmonary disease. Electronically Signed   By: Charlett Nose M.D.   On: 09/04/2017 08:00    Procedures Procedures (including critical care time)  Medications Ordered in ED Medications - No data to display   Initial Impression / Assessment and Plan / ED Course  I have reviewed the triage vital signs and the nursing notes.  Pertinent labs & imaging results that were available during my care of the patient were reviewed by me and considered in my medical decision making (see chart for details).     Patient with multiple cardiac risk factors presents with chest pain with radiation to the left arm.  Initial EKG and troponin negative.  Second troponin 0 0.28.  Aspirin  does not agree with her.  She is hemodynamically stable.  Will consult cardiology.   CRITICAL CARE Performed by: Donnetta Hutching Total critical care time: 30 minutes Critical care time was exclusive of separately billable procedures and treating other patients. Critical care was necessary to treat or prevent imminent or life-threatening deterioration. Critical care was time spent personally by me on the following activities: development of treatment plan with patient and/or surrogate as well as nursing, discussions with consultants, evaluation of patient's response to treatment, examination of patient, obtaining history from patient  or surrogate, ordering and performing treatments and interventions, ordering and review of laboratory studies, ordering and review of radiographic studies, pulse oximetry and re-evaluation of patient's condition.  Final Clinical Impressions(s) / ED Diagnoses   Final diagnoses:  Chest pain, unspecified type  Elevated troponin    ED Discharge Orders    None       Donnetta Hutching, MD 09/04/17 1113

## 2017-09-04 NOTE — ED Notes (Signed)
Cardiology aware of troponin

## 2017-09-04 NOTE — ED Notes (Signed)
Continuing to page admitting for troponin results

## 2017-09-04 NOTE — ED Notes (Signed)
Attempted report 

## 2017-09-04 NOTE — ED Triage Notes (Signed)
Pt in c/o chest pain that started this morning while getting ready for work, developed numbness and pain in left arm, rest improved pain, denies chest pain at this time but continues to have pain and numbness to left arm that starts in axillary area, denies shortness of breath or n/v

## 2017-09-04 NOTE — ED Notes (Signed)
Paged admitting to notify about troponin. No call back

## 2017-09-04 NOTE — ED Notes (Signed)
Paged cardmaster to LymanHayley, RN

## 2017-09-04 NOTE — Progress Notes (Signed)
ANTICOAGULATION CONSULT NOTE - Follow Up Consult  Pharmacy Consult for Heparin Indication: chest pain/ACS  Allergies  Allergen Reactions  . Aspirin Other (See Comments)    Burns stomach  . Codeine Rash    Patient Measurements: Height: 5\' 3"  (160 cm) Weight: 116 lb 9.6 oz (52.9 kg) IBW/kg (Calculated) : 52.4 Heparin Dosing Weight:  52.9 kg  Vital Signs: Temp: 97.5 F (36.4 C) (02/22 1935) Temp Source: Oral (02/22 1935) BP: 127/78 (02/22 1935) Pulse Rate: 61 (02/22 1935)  Labs: Recent Labs    09/04/17 0732 09/04/17 1458 09/04/17 2117  HGB 14.6  --   --   HCT 44.0  --   --   PLT 330  --   --   HEPARINUNFRC  --   --  0.47  CREATININE 0.76  --   --   TROPONINI  --  0.69*  --     Estimated Creatinine Clearance: 65 mL/min (by C-G formula based on SCr of 0.76 mg/dL).  Assessment:  Anticoag: Heparin for r/o ACS, stress test 2/23. Baseline CBC WNL.  HL 0.47 in goal.  Goal of Therapy:  Heparin level 0.3-0.7 units/ml Monitor platelets by anticoagulation protocol: Yes   Plan:  Continue IV heparin at 700 units/hr Heparin level and CBC in AM  Lashaundra Lehrmann S. Merilynn Finlandobertson, PharmD, BCPS Clinical Staff Pharmacist Pager [phone]  Misty Stanleyobertson, Chrisette Man Stillinger 09/04/2017,10:21 PM

## 2017-09-05 ENCOUNTER — Other Ambulatory Visit (HOSPITAL_COMMUNITY): Payer: BLUE CROSS/BLUE SHIELD

## 2017-09-05 ENCOUNTER — Observation Stay (HOSPITAL_COMMUNITY): Payer: BLUE CROSS/BLUE SHIELD

## 2017-09-05 DIAGNOSIS — E7849 Other hyperlipidemia: Secondary | ICD-10-CM

## 2017-09-05 DIAGNOSIS — R079 Chest pain, unspecified: Secondary | ICD-10-CM

## 2017-09-05 DIAGNOSIS — I252 Old myocardial infarction: Secondary | ICD-10-CM | POA: Diagnosis not present

## 2017-09-05 DIAGNOSIS — I10 Essential (primary) hypertension: Secondary | ICD-10-CM | POA: Diagnosis not present

## 2017-09-05 DIAGNOSIS — I214 Non-ST elevation (NSTEMI) myocardial infarction: Secondary | ICD-10-CM | POA: Diagnosis not present

## 2017-09-05 DIAGNOSIS — E1142 Type 2 diabetes mellitus with diabetic polyneuropathy: Secondary | ICD-10-CM | POA: Diagnosis not present

## 2017-09-05 LAB — CBC
HCT: 46.4 % — ABNORMAL HIGH (ref 36.0–46.0)
Hemoglobin: 15.4 g/dL — ABNORMAL HIGH (ref 12.0–15.0)
MCH: 31.2 pg (ref 26.0–34.0)
MCHC: 33.2 g/dL (ref 30.0–36.0)
MCV: 93.9 fL (ref 78.0–100.0)
Platelets: 291 10*3/uL (ref 150–400)
RBC: 4.94 MIL/uL (ref 3.87–5.11)
RDW: 13.5 % (ref 11.5–15.5)
WBC: 10 10*3/uL (ref 4.0–10.5)

## 2017-09-05 LAB — BASIC METABOLIC PANEL
Anion gap: 9 (ref 5–15)
BUN: 16 mg/dL (ref 6–20)
CO2: 21 mmol/L — ABNORMAL LOW (ref 22–32)
Calcium: 9.2 mg/dL (ref 8.9–10.3)
Chloride: 106 mmol/L (ref 101–111)
Creatinine, Ser: 0.82 mg/dL (ref 0.44–1.00)
GFR calc Af Amer: >60 mL/min (ref >60)
GFR calc non Af Amer: >60 mL/min (ref >60)
Glucose, Bld: 212 mg/dL — ABNORMAL HIGH (ref 65–99)
Potassium: 5.2 mmol/L — ABNORMAL HIGH (ref 3.5–5.1)
Sodium: 136 mmol/L (ref 135–145)

## 2017-09-05 LAB — GLUCOSE, CAPILLARY: Glucose-Capillary: 296 mg/dL — ABNORMAL HIGH (ref 65–99)

## 2017-09-05 LAB — TROPONIN I: Troponin I: 0.73 ng/mL (ref <0.03)

## 2017-09-05 LAB — NM MYOCAR MULTI W/SPECT W/WALL MOTION / EF
Estimated workload: 4.6 METS
Exercise duration (min): 4 min
Exercise duration (sec): 47 s
MPHR: 164 {beats}/min
Peak HR: 129 {beats}/min
Percent HR: 78 %
RPE: 18
Rest HR: 56 {beats}/min

## 2017-09-05 LAB — HEPARIN LEVEL (UNFRACTIONATED): Heparin Unfractionated: 0.38 IU/mL (ref 0.30–0.70)

## 2017-09-05 LAB — LIPID PANEL
Cholesterol: 259 mg/dL — ABNORMAL HIGH (ref 0–200)
HDL: 46 mg/dL (ref >40)
LDL Cholesterol: 191 mg/dL — ABNORMAL HIGH (ref 0–99)
Total CHOL/HDL Ratio: 5.6 RATIO
Triglycerides: 109 mg/dL (ref <150)
VLDL: 22 mg/dL (ref 0–40)

## 2017-09-05 LAB — TSH: TSH: 1.473 u[IU]/mL (ref 0.350–4.500)

## 2017-09-05 LAB — HIV ANTIBODY (ROUTINE TESTING W REFLEX): HIV Screen 4th Generation wRfx: NONREACTIVE

## 2017-09-05 MED ORDER — TECHNETIUM TC 99M TETROFOSMIN IV KIT
30.0000 | PACK | Freq: Once | INTRAVENOUS | Status: AC | PRN
  Administered 2017-09-05: 30 via INTRAVENOUS

## 2017-09-05 MED ORDER — INSULIN ASPART 100 UNIT/ML ~~LOC~~ SOLN
0.0000 [IU] | Freq: Every day | SUBCUTANEOUS | Status: DC
  Administered 2017-09-05: 3 [IU] via SUBCUTANEOUS

## 2017-09-05 MED ORDER — TECHNETIUM TC 99M TETROFOSMIN IV KIT
10.0000 | PACK | Freq: Once | INTRAVENOUS | Status: AC | PRN
  Administered 2017-09-05: 10 via INTRAVENOUS

## 2017-09-05 MED ORDER — ATORVASTATIN CALCIUM 80 MG PO TABS
80.0000 mg | ORAL_TABLET | Freq: Every day | ORAL | Status: DC
  Administered 2017-09-05 – 2017-09-07 (×3): 80 mg via ORAL
  Filled 2017-09-05 (×3): qty 1

## 2017-09-05 MED ORDER — INSULIN ASPART 100 UNIT/ML ~~LOC~~ SOLN
0.0000 [IU] | Freq: Three times a day (TID) | SUBCUTANEOUS | Status: DC
  Administered 2017-09-06: 2 [IU] via SUBCUTANEOUS
  Administered 2017-09-06: 1 [IU] via SUBCUTANEOUS
  Administered 2017-09-06: 2 [IU] via SUBCUTANEOUS
  Administered 2017-09-07 – 2017-09-08 (×2): 1 [IU] via SUBCUTANEOUS

## 2017-09-05 NOTE — Progress Notes (Signed)
ANTICOAGULATION CONSULT NOTE - Follow Up Consult  Pharmacy Consult for Heparin Indication: chest pain/ACS   Allergies  Allergen Reactions  . Aspirin Other (See Comments)    Burns stomach  . Codeine Rash    Patient Measurements: Height: 5\' 3"  (160 cm) Weight: 116 lb 11.2 oz (52.9 kg) IBW/kg (Calculated) : 52.4 Heparin Dosing Weight:  52.9 kg  Vital Signs: Temp: 97.8 F (36.6 C) (02/23 0430) Temp Source: Oral (02/23 0430) BP: 153/69 (02/23 0959) Pulse Rate: 66 (02/23 0959)  Labs: Recent Labs    09/04/17 0732 09/04/17 1458 09/04/17 2117 09/05/17 0259  HGB 14.6  --   --  15.4*  HCT 44.0  --   --  46.4*  PLT 330  --   --  291  HEPARINUNFRC  --   --  0.47 0.38  CREATININE 0.76  --   --  0.82  TROPONINI  --  0.69* 0.74* 0.73*    Estimated Creatinine Clearance: 63.4 mL/min (by C-G formula based on SCr of 0.82 mg/dL).  Assessment: [age] yoF who presented to the ED with chest pain. Pharmacy has been consulted for Heparin for NSTEMI, stress test 2/23 - final results pending. Likely will need cardiac cath.  Baseline CBC WNL.  Heparin level 0.38 in goal. No bleeding noted  Goal of Therapy:  Heparin level 0.3-0.7 units/ml Monitor platelets by anticoagulation protocol: Yes   Plan:  Continue IV heparin at 700 units/hr Daily heparin level and CBC Monitor for s/sx of bleeding   Tosha Belgarde L. Marcy Salvoaymond, PharmD, MS PGY1 Pharmacy Resident Pager: [phone]

## 2017-09-05 NOTE — Progress Notes (Deleted)
The patient was seen and examined, and I agree with the history, physical exam, assessment and plan as documented by V. Bhagat PA-C, with modifications as noted below. I have also personally reviewed all relevant documentation, old records, labs, and both radiographic and cardiovascular studies. I have also independently interpreted old and new ECG's.  Ms.Currieis a57 y.o.femalewith a PMH of Type 2 DM, HLD, ?HTN (patient denies), and hypothyroidism who [facility] with complaints of chest pain which started around 6am 09/04/17.  Assessment & Plan    1. NSTEMI - Symptoms concerning for unstable angina.Troponin 0.02-->0.08-->0.69-->0.74-->0.73. She is on IV heparin. EKG with new Biphasic TWI laterally today.  -Kept her on stage I during exercise stress test due to fatigue, she had significant ST depression inferior laterally. She was hypertensive. No chest pain during stress test. Pending final reading. -  Her cardiac risk factor profile notable for type 2 diabetes mellitus for approximately 16 years, hyperlipidemia, questionable hypertension, and strong family history for coronary artery disease.   2. HTN - BP improving  3. HLD - 09/05/2017: Cholesterol 259; HDL 46; LDL Cholesterol 191; Triglycerides 109; VLDL 22  - will increase lipitor to 80mg  qd   No chest pain at present or during stress test. ECG abnormalities noted. On IV heparin. Cath planned for Monday.   Prentice DockerSuresh Hatsuko Bizzarro, MD, [facility]  09/05/2017 1:01 PM

## 2017-09-05 NOTE — Progress Notes (Addendum)
Progress Note  Patient Name: Carmen Anderson Date of Encounter: 09/05/2017  Primary Cardiologist: Nicki Guadalajarahomas Kelly, MD   Subjective   No recurrent chest pain. Seen in nuclear medicine.   Inpatient Medications    Scheduled Meds: . aspirin EC  81 mg Oral Daily  . atorvastatin  40 mg Oral q1800  . gabapentin  100 mg Oral QHS  . levothyroxine  50 mcg Oral QAC breakfast  . methylPREDNISolone   Oral Daily  . methylPREDNISolone  12 mg Oral Q breakfast   Followed by  . [START ON 09/06/2017] methylPREDNISolone  8 mg Oral Q breakfast   Followed by  . [START ON 09/07/2017] methylPREDNISolone  4 mg Oral Daily   Continuous Infusions: . heparin 700 Units/hr (09/04/17 1555)  . nitroGLYCERIN     PRN Meds: acetaminophen, hydrALAZINE, nitroGLYCERIN, ondansetron (ZOFRAN) IV   Vital Signs    Vitals:   09/05/17 0949 09/05/17 0952 09/05/17 0955 09/05/17 0959  BP:  (!) 205/59 (!) 179/60 (!) 153/69  Pulse: 78 (!) 128 94 66  Resp:      Temp:      TempSrc:      SpO2:      Weight:      Height:        Intake/Output Summary (Last 24 hours) at 09/05/2017 1007 Last data filed at 09/05/2017 0700 Gross per 24 hour  Intake 345.58 ml  Output -  Net 345.58 ml   Filed Weights   09/04/17 0730 09/04/17 1824 09/05/17 0430  Weight: 118 lb (53.5 kg) 116 lb 9.6 oz (52.9 kg) 116 lb 11.2 oz (52.9 kg)    Telemetry    Unable to review as patient seen in nuclear med.  - Personally Reviewed  ECG    SR with new biphasic TWI in lateral leads - Personally Reviewed  Physical Exam   GEN: No acute distress.   Neck: No JVD Cardiac: RRR, no murmurs, rubs, or gallops. L radial hematoma due to lab drawn.  Respiratory: Clear to auscultation bilaterally. GI: Soft, nontender, non-distended  MS: No edema; No deformity. Neuro:  Nonfocal  Psych: Normal affect   Labs    Chemistry Recent Labs  Lab 09/04/17 0732 09/05/17 0259  NA 136 136  K 4.1 5.2*  CL 103 106  CO2 19* 21*  GLUCOSE 165* 212*  BUN 13  16  CREATININE 0.76 0.82  CALCIUM 9.8 9.2  GFRNONAA >60 >60  GFRAA >60 >60  ANIONGAP 14 9     Hematology Recent Labs  Lab 09/04/17 0732 09/05/17 0259  WBC 13.2* 10.0  RBC 4.74 4.94  HGB 14.6 15.4*  HCT 44.0 46.4*  MCV 92.8 93.9  MCH 30.8 31.2  MCHC 33.2 33.2  RDW 13.5 13.5  PLT 330 291    Cardiac Enzymes Recent Labs  Lab 09/04/17 1458 09/04/17 2117 09/05/17 0259  TROPONINI 0.69* 0.74* 0.73*    Recent Labs  Lab 09/04/17 0741 09/04/17 1056  TROPIPOC 0.02 0.28*     BNPNo results for input(s): BNP, PROBNP in the last 168 hours.   DDimer No results for input(s): DDIMER in the last 168 hours.   Radiology    Dg Chest 2 View  Result Date: 09/04/2017 CLINICAL DATA:  Chest pain EXAM: CHEST  2 VIEW COMPARISON:  None. FINDINGS: Heart and mediastinal contours are within normal limits. No focal opacities or effusions. No acute bony abnormality. IMPRESSION: No active cardiopulmonary disease. Electronically Signed   By: Charlett NoseKevin  Dover M.D.   On: 09/04/2017  08:00    Cardiac Studies   Pending stress test  Patient Profile     Carmen Anderson is a 57 y.o. female with a PMH of Type 2 DM, HLD, ?HTN (patient denies), and hypothyroidism who presents to [facility] ED with complaints of chest pain which started around 6am 09/04/17.  Assessment & Plan    1. NSTEMI - Symptoms concerning for unstable angina.Troponin 0.02-->0.08-->0.69-->0.74-->0.73. She is on IV heparin. EKG with new Biphasic TWI laterally today.  -Kept her on stage I during exercise stress test due to fatigue, she had significant ST depression inferior laterally. She was hypertensive. No chest pain during stress test. Pending final reading. - She will likely needs cardiac cath. Her cardiac risk factor profile notable for type 2 diabetes mellitus for approximately 16 years, hyperlipidemia, questionable hypertension, and strong family history for coronary artery disease.    2. HTN - BP improving  3. HLD - 09/05/2017:  Cholesterol 259; HDL 46; LDL Cholesterol 191; Triglycerides 109; VLDL 22  - will increase lipitor to 80mg  qd     For questions or updates, please contact [facility] Please consult [email or web address] for contact info under Cardiology/STEMI.      SignedSharrell Ku [place], PA  09/05/2017, 10:07 AM    The patient was seen and examined, and I agree with the history, physical exam, assessment and plan as documented above, with modifications as noted below. I have also personally reviewed all relevant documentation, old records, labs, and both radiographic and cardiovascular studies. I have also independently interpreted old and new ECG's.  No longer having chest pain. New ECG abnormalities. Currently on IV heparin. Statin dose increased for hyperlipidemia.   Stress test results reviewed:    Defect 1: There is a medium defect of moderate severity present in the mid anteroseptal, apical anterior and apical septal location.  Findings consistent with prior myocardial infarction with peri-infarct ischemia.  This is an intermediate risk study. Extreme fatigue and poor exercise tolerance.   Cath planned for Monday.  Prentice Docker, MD, [facility]  09/05/2017 2:52 PM

## 2017-09-06 ENCOUNTER — Observation Stay (HOSPITAL_BASED_OUTPATIENT_CLINIC_OR_DEPARTMENT_OTHER): Payer: BLUE CROSS/BLUE SHIELD

## 2017-09-06 DIAGNOSIS — Z7984 Long term (current) use of oral hypoglycemic drugs: Secondary | ICD-10-CM | POA: Diagnosis not present

## 2017-09-06 DIAGNOSIS — R778 Other specified abnormalities of plasma proteins: Secondary | ICD-10-CM

## 2017-09-06 DIAGNOSIS — Z886 Allergy status to analgesic agent status: Secondary | ICD-10-CM | POA: Diagnosis not present

## 2017-09-06 DIAGNOSIS — Z8249 Family history of ischemic heart disease and other diseases of the circulatory system: Secondary | ICD-10-CM | POA: Diagnosis not present

## 2017-09-06 DIAGNOSIS — R079 Chest pain, unspecified: Secondary | ICD-10-CM | POA: Diagnosis not present

## 2017-09-06 DIAGNOSIS — F17210 Nicotine dependence, cigarettes, uncomplicated: Secondary | ICD-10-CM | POA: Diagnosis present

## 2017-09-06 DIAGNOSIS — R748 Abnormal levels of other serum enzymes: Secondary | ICD-10-CM

## 2017-09-06 DIAGNOSIS — R001 Bradycardia, unspecified: Secondary | ICD-10-CM | POA: Diagnosis present

## 2017-09-06 DIAGNOSIS — I10 Essential (primary) hypertension: Secondary | ICD-10-CM

## 2017-09-06 DIAGNOSIS — R7989 Other specified abnormal findings of blood chemistry: Secondary | ICD-10-CM

## 2017-09-06 DIAGNOSIS — Z79890 Hormone replacement therapy (postmenopausal): Secondary | ICD-10-CM | POA: Diagnosis not present

## 2017-09-06 DIAGNOSIS — Z8349 Family history of other endocrine, nutritional and metabolic diseases: Secondary | ICD-10-CM | POA: Diagnosis not present

## 2017-09-06 DIAGNOSIS — Z885 Allergy status to narcotic agent status: Secondary | ICD-10-CM | POA: Diagnosis not present

## 2017-09-06 DIAGNOSIS — I340 Nonrheumatic mitral (valve) insufficiency: Secondary | ICD-10-CM

## 2017-09-06 DIAGNOSIS — R9439 Abnormal result of other cardiovascular function study: Secondary | ICD-10-CM | POA: Diagnosis not present

## 2017-09-06 DIAGNOSIS — E7800 Pure hypercholesterolemia, unspecified: Secondary | ICD-10-CM

## 2017-09-06 DIAGNOSIS — I214 Non-ST elevation (NSTEMI) myocardial infarction: Secondary | ICD-10-CM | POA: Diagnosis not present

## 2017-09-06 DIAGNOSIS — Z90710 Acquired absence of both cervix and uterus: Secondary | ICD-10-CM | POA: Diagnosis not present

## 2017-09-06 DIAGNOSIS — D72829 Elevated white blood cell count, unspecified: Secondary | ICD-10-CM | POA: Diagnosis present

## 2017-09-06 DIAGNOSIS — I252 Old myocardial infarction: Secondary | ICD-10-CM | POA: Diagnosis not present

## 2017-09-06 DIAGNOSIS — M722 Plantar fascial fibromatosis: Secondary | ICD-10-CM | POA: Diagnosis present

## 2017-09-06 DIAGNOSIS — I2510 Atherosclerotic heart disease of native coronary artery without angina pectoris: Secondary | ICD-10-CM | POA: Diagnosis not present

## 2017-09-06 DIAGNOSIS — E785 Hyperlipidemia, unspecified: Secondary | ICD-10-CM | POA: Diagnosis not present

## 2017-09-06 DIAGNOSIS — E1142 Type 2 diabetes mellitus with diabetic polyneuropathy: Secondary | ICD-10-CM | POA: Diagnosis not present

## 2017-09-06 DIAGNOSIS — Z833 Family history of diabetes mellitus: Secondary | ICD-10-CM | POA: Diagnosis not present

## 2017-09-06 DIAGNOSIS — Z79899 Other long term (current) drug therapy: Secondary | ICD-10-CM | POA: Diagnosis not present

## 2017-09-06 DIAGNOSIS — Z823 Family history of stroke: Secondary | ICD-10-CM | POA: Diagnosis not present

## 2017-09-06 DIAGNOSIS — E039 Hypothyroidism, unspecified: Secondary | ICD-10-CM | POA: Diagnosis present

## 2017-09-06 LAB — ECHOCARDIOGRAM COMPLETE
E decel time: 310 msec
E/e' ratio: 12.88
FS: 41 % (ref 28–44)
Height: 63 in
IVS/LV PW RATIO, ED: 0.9
LA ID, A-P, ES: 32 mm
LA diam end sys: 32 mm
LA diam index: 2.08 cm/m2
LA vol A4C: 41.1 ml
LA vol index: 24.5 mL/m2
LA vol: 37.8 mL
LV E/e' medial: 12.88
LV E/e'average: 12.88
LV PW d: 8.26 mm — AB (ref 0.6–1.1)
LV e' LATERAL: 8.31 cm/s
LVOT SV: 50 mL
LVOT VTI: 28.4 cm
LVOT area: 1.77 cm2
LVOT diameter: 15 mm
LVOT peak grad rest: 8 mmHg
LVOT peak vel: 139 cm/s
Lateral S' vel: 17.6 cm/s
MV Dec: 310
MV Peak grad: 5 mmHg
MV pk A vel: 74.1 m/s
MV pk E vel: 107 m/s
TAPSE: 23.3 mm
TDI e' lateral: 8.31
TDI e' medial: 8.5
Weight: 1870.4 oz

## 2017-09-06 LAB — CBC
HCT: 39.6 % (ref 36.0–46.0)
Hemoglobin: 13 g/dL (ref 12.0–15.0)
MCH: 30.2 pg (ref 26.0–34.0)
MCHC: 32.8 g/dL (ref 30.0–36.0)
MCV: 92.1 fL (ref 78.0–100.0)
Platelets: 278 10*3/uL (ref 150–400)
RBC: 4.3 MIL/uL (ref 3.87–5.11)
RDW: 13.5 % (ref 11.5–15.5)
WBC: 12.6 10*3/uL — ABNORMAL HIGH (ref 4.0–10.5)

## 2017-09-06 LAB — GLUCOSE, CAPILLARY
Glucose-Capillary: 129 mg/dL — ABNORMAL HIGH (ref 65–99)
Glucose-Capillary: 162 mg/dL — ABNORMAL HIGH (ref 65–99)
Glucose-Capillary: 152 mg/dL — ABNORMAL HIGH (ref 65–99)
Glucose-Capillary: 132 mg/dL — ABNORMAL HIGH (ref 65–99)

## 2017-09-06 LAB — HEPARIN LEVEL (UNFRACTIONATED)
Heparin Unfractionated: 0.25 IU/mL — ABNORMAL LOW (ref 0.30–0.70)
Heparin Unfractionated: 0.4 IU/mL (ref 0.30–0.70)

## 2017-09-06 MED ORDER — SODIUM CHLORIDE 0.9 % IV SOLN: 250.0000 mL | INTRAVENOUS | Status: DC | PRN

## 2017-09-06 MED ORDER — SODIUM CHLORIDE 0.9% FLUSH
3.0000 mL | Freq: Two times a day (BID) | INTRAVENOUS | Status: DC
  Administered 2017-09-06: 3 mL via INTRAVENOUS

## 2017-09-06 MED ORDER — SODIUM CHLORIDE 0.9% FLUSH: 3.0000 mL | INTRAVENOUS | Status: DC | PRN

## 2017-09-06 MED ORDER — SODIUM CHLORIDE 0.9 % WEIGHT BASED INFUSION: 1.0000 mL/kg/h | INTRAVENOUS | Status: DC

## 2017-09-06 MED ORDER — ASPIRIN 81 MG PO CHEW
81.0000 mg | CHEWABLE_TABLET | ORAL | Status: AC
  Administered 2017-09-07: 81 mg via ORAL
  Filled 2017-09-06: qty 1

## 2017-09-06 MED ORDER — SODIUM CHLORIDE 0.9 % WEIGHT BASED INFUSION
3.0000 mL/kg/h | INTRAVENOUS | Status: DC
  Administered 2017-09-07: 3 mL/kg/h via INTRAVENOUS

## 2017-09-06 NOTE — Progress Notes (Signed)
ANTICOAGULATION CONSULT NOTE - Follow Up Consult  Pharmacy Consult for heparin Indication: NSTEMI  Labs: Recent Labs    09/04/17 0732 09/04/17 1458 09/04/17 2117 09/05/17 0259 09/06/17 0324  HGB 14.6  --   --  15.4* 13.0  HCT 44.0  --   --  46.4* 39.6  PLT 330  --   --  291 278  HEPARINUNFRC  --   --  0.47 0.38 0.25*  CREATININE 0.76  --   --  0.82  --   TROPONINI  --  0.69* 0.74* 0.73*  --     Assessment: 57yo female now subtherapeutic on heparin after two levels at goal though had been trending down.  Goal of Therapy:  Heparin level 0.3-0.7 units/ml   Plan:  Will increase heparin gtt by 2 units/kg/hr to 800 units/hr and check level in 6 hours.    Vernard GamblesVeronda Tymeka Privette, PharmD, BCPS  09/06/2017,5:04 AM

## 2017-09-06 NOTE — Progress Notes (Signed)
  Echocardiogram 2D Echocardiogram has been performed.  [name], [name] 09/06/2017, 11:44 AM

## 2017-09-06 NOTE — Progress Notes (Addendum)
Progress Note  Patient Name: Carmen Anderson Date of Encounter: 09/06/2017  Primary Cardiologist: Nicki Guadalajara, MD   Subjective   Denies any chest pain or SOB  Inpatient Medications    Scheduled Meds: . aspirin EC  81 mg Oral Daily  . atorvastatin  80 mg Oral q1800  . gabapentin  100 mg Oral QHS  . insulin aspart  0-5 Units Subcutaneous QHS  . insulin aspart  0-9 Units Subcutaneous TID WC  . levothyroxine  50 mcg Oral QAC breakfast  . methylPREDNISolone  8 mg Oral Q breakfast   Followed by  . [START ON 09/07/2017] methylPREDNISolone  4 mg Oral Daily   Continuous Infusions: . heparin 800 Units/hr (09/06/17 0639)  . nitroGLYCERIN     PRN Meds: acetaminophen, hydrALAZINE, nitroGLYCERIN, ondansetron (ZOFRAN) IV   Vital Signs    Vitals:   09/05/17 1144 09/05/17 1339 09/05/17 2038 09/06/17 0507  BP: 136/61 (!) 126/53 (!) 125/56 (!) 129/55  Pulse:  67 (!) 58 (!) 52  Resp:   16 17  Temp:  98 F (36.7 C) 98.2 F (36.8 C) 97.9 F (36.6 C)  TempSrc:  Oral Oral Oral  SpO2:  99% 98% 99%  Weight:    116 lb 14.4 oz (53 kg)  Height:        Intake/Output Summary (Last 24 hours) at 09/06/2017 0819 Last data filed at 09/06/2017 0300 Gross per 24 hour  Intake 776 ml  Output -  Net 776 ml   Filed Weights   09/04/17 1824 09/05/17 0430 09/06/17 0507  Weight: 116 lb 9.6 oz (52.9 kg) 116 lb 11.2 oz (52.9 kg) 116 lb 14.4 oz (53 kg)    Telemetry    NSR - Personally Reviewed  ECG    NSR with ST/T wave abnormality c/w anterolateral ischemai - Personally Reviewed  Physical Exam   GEN: No acute distress.   Neck: No JVD Cardiac: RRR, no murmurs, rubs, or gallops.  Respiratory: Clear to auscultation bilaterally. GI: Soft, nontender, non-distended  MS: No edema; No deformity. Neuro:  Nonfocal  Psych: Normal affect   Labs    Chemistry Recent Labs  Lab 09/04/17 0732 09/05/17 0259  NA 136 136  K 4.1 5.2*  CL 103 106  CO2 19* 21*  GLUCOSE 165* 212*  BUN 13 16    CREATININE 0.76 0.82  CALCIUM 9.8 9.2  GFRNONAA >60 >60  GFRAA >60 >60  ANIONGAP 14 9     Hematology Recent Labs  Lab 09/04/17 0732 09/05/17 0259 09/06/17 0324  WBC 13.2* 10.0 12.6*  RBC 4.74 4.94 4.30  HGB 14.6 15.4* 13.0  HCT 44.0 46.4* 39.6  MCV 92.8 93.9 92.1  MCH 30.8 31.2 30.2  MCHC 33.2 33.2 32.8  RDW 13.5 13.5 13.5  PLT 330 291 278    Cardiac Enzymes Recent Labs  Lab 09/04/17 1458 09/04/17 2117 09/05/17 0259  TROPONINI 0.69* 0.74* 0.73*    Recent Labs  Lab 09/04/17 0741 09/04/17 1056  TROPIPOC 0.02 0.28*     BNPNo results for input(s): BNP, PROBNP in the last 168 hours.   DDimer No results for input(s): DDIMER in the last 168 hours.   Radiology    Nm Myocar Multi W/spect W/wall Motion / Ef  Result Date: 09/05/2017  Blood pressure demonstrated a hypertensive response to exercise.  Nuclear stress EF: 71%.  Upsloping ST segment depression ST segment depression of 2 mm was noted during stress in the II, III, aVF, V4, V5 and V6 leads,  beginning at 1 minutes of stress, ending at 2 minutes of stress, and returning to baseline after 1-5 minutes of recovery. After returning ST to baseline, she had TWI inferior laterally  Defect 1: There is a medium defect of moderate severity present in the mid anteroseptal, apical anterior and apical septal location.  Findings consistent with prior myocardial infarction with peri-infarct ischemia.  This is an intermediate risk study. Extreme fatigue and poor exercise tolerance.  Recommend cardiac cath Donato SchultzMark Skains, MD    Cardiac Studies   Nuclear stress test 08/2017  Blood pressure demonstrated a hypertensive response to exercise.  Nuclear stress EF: 71%.  Upsloping ST segment depression ST segment depression of 2 mm was noted during stress in the II, III, aVF, V4, V5 and V6 leads, beginning at 1 minutes of stress, ending at 2 minutes of stress, and returning to baseline after 1-5 minutes of recovery. After returning ST  to baseline, she had TWI inferior laterally  Defect 1: There is a medium defect of moderate severity present in the mid anteroseptal, apical anterior and apical septal location.  Findings consistent with prior myocardial infarction with peri-infarct ischemia.  This is an intermediate risk study. Extreme fatigue and poor exercise tolerance.  Patient Profile     57 y.o. female with a PMH of Type 2 DM, HLD, ?HTN (patient denies), and hypothyroidism who [facility] complaints of chest pain which started around 6am 09/04/17.   Assessment & Plan    1. NSTEMI -  Troponin 0.02-->0.08-->0.69-->0.74-->0.73.  -  EKG with new Biphasic TWI laterally  -  Nuclear stress test done yesterday showed a defect in the anteroseptal, apical anterior and apical septal regions c/w prior infarct and peri infarct ischemia -  Plan for cath tomorrow -  Cardiac catheterization was discussed with the patient fully. The patient understands that risks include but are not limited to stroke (1 in 1000), death (1 in 1000), kidney failure [usually temporary] (1 in 500), bleeding (1 in 200), allergic reaction [possibly serious] (1 in 200).  The patient understands and is willing to proceed.   -  continue ASA, high dose statin, IV heparin gttand IV NTG gtt.  2. HTN - BP better controlled and 129/8355mmHg this am - currently on no antihypertensive meds  3. HLD - 09/05/2017: Cholesterol 259; HDL 46; LDL 191; Triglycerides 109; VLDL 22  - lipitor increased to 80mg  daily.  - check HbgA1C   I have spent a total of 35 minutes with patient reviewing nuclear stress test , telemetry, EKGs, labs and examining patient as well as establishing an assessment and plan that was discussed with the patient.  > 50% of time was spent in direct patient care.  Spent time discussing cath procedure and getting consent for cath as well.   For questions or updates, please contact [facility] Please consult [email or web address] for contact  info under Cardiology/STEMI.      Signed, Armanda Magicraci Tamsin Nader, MD  09/06/2017, 8:19 AM

## 2017-09-07 ENCOUNTER — Encounter (HOSPITAL_COMMUNITY): Payer: Self-pay | Admitting: Cardiovascular Disease

## 2017-09-07 ENCOUNTER — Inpatient Hospital Stay (HOSPITAL_COMMUNITY)
Admission: EM | Disposition: A | Discharge status: Home / Self Care | Payer: Self-pay | Source: Home / Self Care | Attending: Cardiovascular Disease

## 2017-09-07 HISTORY — PX: LEFT HEART CATH AND CORONARY ANGIOGRAPHY: CATH118249

## 2017-09-07 HISTORY — PX: CORONARY STENT INTERVENTION: CATH118234

## 2017-09-07 LAB — GLUCOSE, CAPILLARY
Glucose-Capillary: 97 mg/dL (ref 65–99)
Glucose-Capillary: 129 mg/dL — ABNORMAL HIGH (ref 65–99)
Glucose-Capillary: 142 mg/dL — ABNORMAL HIGH (ref 65–99)

## 2017-09-07 LAB — CBC
HCT: 41.8 % (ref 36.0–46.0)
Hemoglobin: 13.5 g/dL (ref 12.0–15.0)
MCH: 30 pg (ref 26.0–34.0)
MCHC: 32.3 g/dL (ref 30.0–36.0)
MCV: 92.9 fL (ref 78.0–100.0)
Platelets: 302 10*3/uL (ref 150–400)
RBC: 4.5 MIL/uL (ref 3.87–5.11)
RDW: 13.5 % (ref 11.5–15.5)
WBC: 11.8 10*3/uL — ABNORMAL HIGH (ref 4.0–10.5)

## 2017-09-07 LAB — PROTIME-INR
INR: 0.92
Prothrombin Time: 12.3 seconds (ref 11.4–15.2)

## 2017-09-07 LAB — HEMOGLOBIN A1C
Hgb A1c MFr Bld: 6.9 % — ABNORMAL HIGH (ref 4.8–5.6)
Mean Plasma Glucose: 151 mg/dL

## 2017-09-07 LAB — HEPARIN LEVEL (UNFRACTIONATED): Heparin Unfractionated: 0.54 IU/mL (ref 0.30–0.70)

## 2017-09-07 LAB — POCT ACTIVATED CLOTTING TIME: Activated Clotting Time: 406 seconds

## 2017-09-07 SURGERY — LEFT HEART CATH AND CORONARY ANGIOGRAPHY
Anesthesia: LOCAL

## 2017-09-07 MED ORDER — SODIUM CHLORIDE 0.9 % IV SOLN: INTRAVENOUS | Status: AC

## 2017-09-07 MED ORDER — HEPARIN (PORCINE) IN NACL 2-0.9 UNIT/ML-% IJ SOLN
INTRAMUSCULAR | Status: AC | PRN
  Administered 2017-09-07: 1000 mL

## 2017-09-07 MED ORDER — SODIUM CHLORIDE 0.9% FLUSH
3.0000 mL | Freq: Two times a day (BID) | INTRAVENOUS | Status: DC
  Administered 2017-09-08: 10:00:00 3 mL via INTRAVENOUS

## 2017-09-07 MED ORDER — ONDANSETRON HCL 4 MG/2ML IJ SOLN: 4.0000 mg | Freq: Four times a day (QID) | INTRAMUSCULAR | Status: DC | PRN

## 2017-09-07 MED ORDER — ASPIRIN 81 MG PO CHEW: 81.0000 mg | CHEWABLE_TABLET | Freq: Every day | ORAL | Status: DC

## 2017-09-07 MED ORDER — HEPARIN (PORCINE) IN NACL 2-0.9 UNIT/ML-% IJ SOLN
INTRAMUSCULAR | Status: AC
  Filled 2017-09-07: qty 1000

## 2017-09-07 MED ORDER — LIDOCAINE HCL 1 % IJ SOLN
INTRAMUSCULAR | Status: AC
  Filled 2017-09-07: qty 20

## 2017-09-07 MED ORDER — TICAGRELOR 90 MG PO TABS
ORAL_TABLET | ORAL | Status: DC | PRN
  Administered 2017-09-07: 180 mg via ORAL

## 2017-09-07 MED ORDER — SODIUM CHLORIDE 0.9 % IV SOLN
INTRAVENOUS | Status: AC | PRN
  Administered 2017-09-07: 1.75 mg/kg/h via INTRAVENOUS

## 2017-09-07 MED ORDER — HYDRALAZINE HCL 20 MG/ML IJ SOLN: 5.0000 mg | INTRAMUSCULAR | Status: AC | PRN

## 2017-09-07 MED ORDER — ATORVASTATIN CALCIUM 80 MG PO TABS: 80.0000 mg | ORAL_TABLET | Freq: Every day | ORAL | Status: DC

## 2017-09-07 MED ORDER — LIDOCAINE HCL (PF) 1 % IJ SOLN
INTRAMUSCULAR | Status: DC | PRN
Start: 2017-09-07 — End: 2017-09-07
  Administered 2017-09-07: 2 mL

## 2017-09-07 MED ORDER — SODIUM CHLORIDE 0.9 % IV SOLN
1.7500 mg/kg/h | INTRAVENOUS | Status: AC
  Filled 2017-09-07: qty 250

## 2017-09-07 MED ORDER — BIVALIRUDIN TRIFLUOROACETATE 250 MG IV SOLR
INTRAVENOUS | Status: AC
  Filled 2017-09-07: qty 250

## 2017-09-07 MED ORDER — NITROGLYCERIN 1 MG/10 ML FOR IR/CATH LAB
INTRA_ARTERIAL | Status: DC | PRN
  Administered 2017-09-07: 200 ug via INTRACORONARY

## 2017-09-07 MED ORDER — BIVALIRUDIN BOLUS VIA INFUSION - CUPID
INTRAVENOUS | Status: DC | PRN
  Administered 2017-09-07: 39.975 mg via INTRAVENOUS

## 2017-09-07 MED ORDER — NITROGLYCERIN 1 MG/10 ML FOR IR/CATH LAB
INTRA_ARTERIAL | Status: AC
  Filled 2017-09-07: qty 10

## 2017-09-07 MED ORDER — LABETALOL HCL 5 MG/ML IV SOLN: 10.0000 mg | INTRAVENOUS | Status: AC | PRN

## 2017-09-07 MED ORDER — TICAGRELOR 90 MG PO TABS
90.0000 mg | ORAL_TABLET | Freq: Two times a day (BID) | ORAL | Status: DC
  Administered 2017-09-07 – 2017-09-08 (×2): 90 mg via ORAL
  Filled 2017-09-07 (×2): qty 1

## 2017-09-07 MED ORDER — LIDOCAINE HCL (PF) 1 % IJ SOLN
INTRAMUSCULAR | Status: DC | PRN
  Administered 2017-09-07: 10 mL via INTRA_ARTERIAL
  Administered 2017-09-07: 5 mL via INTRA_ARTERIAL

## 2017-09-07 MED ORDER — IOPAMIDOL (ISOVUE-370) INJECTION 76%
INTRAVENOUS | Status: AC
  Filled 2017-09-07: qty 100

## 2017-09-07 MED ORDER — ACETAMINOPHEN 325 MG PO TABS: 650.0000 mg | ORAL_TABLET | ORAL | Status: DC | PRN

## 2017-09-07 MED ORDER — VERAPAMIL HCL 2.5 MG/ML IV SOLN
INTRAVENOUS | Status: AC
  Filled 2017-09-07: qty 2

## 2017-09-07 MED ORDER — TICAGRELOR 90 MG PO TABS
ORAL_TABLET | ORAL | Status: AC
  Filled 2017-09-07: qty 2

## 2017-09-07 MED ORDER — IOPAMIDOL (ISOVUE-370) INJECTION 76%
INTRAVENOUS | Status: DC | PRN
  Administered 2017-09-07: 135 mL via INTRA_ARTERIAL

## 2017-09-07 MED ORDER — SODIUM CHLORIDE 0.9% FLUSH: 3.0000 mL | INTRAVENOUS | Status: DC | PRN

## 2017-09-07 MED ORDER — ANGIOPLASTY BOOK
Freq: Once | Status: AC
  Administered 2017-09-08: 07:00:00
  Filled 2017-09-07: qty 1

## 2017-09-07 MED ORDER — SODIUM CHLORIDE 0.9 % IV SOLN: 250.0000 mL | INTRAVENOUS | Status: DC | PRN

## 2017-09-07 MED ORDER — HEPARIN SODIUM (PORCINE) 1000 UNIT/ML IJ SOLN
INTRAMUSCULAR | Status: DC | PRN
  Administered 2017-09-07: 3000 [IU] via INTRAVENOUS

## 2017-09-07 SURGICAL SUPPLY — 18 items
BALLN SAPPHIRE 2.0X12 (BALLOONS) ×2
BALLOON SAPPHIRE 2.0X12 (BALLOONS) ×1 IMPLANT
CATH INFINITI 5FR ANG PIGTAIL (CATHETERS) ×2 IMPLANT
CATH INFINITI JR4 5F (CATHETERS) ×2 IMPLANT
CATH OPTITORQUE TIG 4.0 5F (CATHETERS) ×2 IMPLANT
CATH VISTA GUIDE 6FR XBLAD3.5 (CATHETERS) ×2 IMPLANT
DEVICE RAD COMP TR BAND LRG (VASCULAR PRODUCTS) ×2 IMPLANT
GLIDESHEATH SLEND A-KIT 6F 22G (SHEATH) ×2 IMPLANT
GUIDEWIRE INQWIRE 1.5J.035X260 (WIRE) ×1 IMPLANT
INQWIRE 1.5J .035X260CM (WIRE) ×2
KIT ENCORE 26 ADVANTAGE (KITS) ×2 IMPLANT
KIT HEART LEFT (KITS) ×2 IMPLANT
PACK CARDIAC CATHETERIZATION (CUSTOM PROCEDURE TRAY) ×2 IMPLANT
STENT SYNERGY DES 2.25X16 (Permanent Stent) ×2 IMPLANT
TRANSDUCER W/STOPCOCK (MISCELLANEOUS) ×2 IMPLANT
TUBING CIL FLEX 10 FLL-RA (TUBING) ×2 IMPLANT
WIRE ASAHI PROWATER 180CM (WIRE) ×2 IMPLANT
WIRE HI TORQ VERSACORE-J 145CM (WIRE) ×2 IMPLANT

## 2017-09-07 NOTE — Plan of Care (Signed)
  Progressing Education: Knowledge of General Education information will improve 09/07/2017 2136 - Progressing by Leata MouseAninon, Wendy Hoback S, RN Health Behavior/Discharge Planning: Ability to manage health-related needs will improve 09/07/2017 2136 - Progressing by Leata MouseAninon, Jonice Cerra S, RN Clinical Measurements: Ability to maintain clinical measurements within normal limits will improve 09/07/2017 2136 - Progressing by Leata MouseAninon, Sloan Takagi S, RN Will remain free from infection 09/07/2017 2136 - Progressing by Leata MouseAninon, Nadie Fiumara S, RN Diagnostic test results will improve 09/07/2017 2136 - Progressing by Leata MouseAninon, Aliese Brannum S, RN Respiratory complications will improve 09/07/2017 2136 - Progressing by Leata MouseAninon, Xavion Muscat S, RN Cardiovascular complication will be avoided 09/07/2017 2136 - Progressing by Leata MouseAninon, Virgil Lightner S, RN Activity: Risk for activity intolerance will decrease 09/07/2017 2136 - Progressing by Leata MouseAninon, Rolando Whitby S, RN Coping: Level of anxiety will decrease 09/07/2017 2136 - Progressing by Leata MouseAninon, Daylan Boggess S, RN Elimination: Will not experience complications related to urinary retention 09/07/2017 2136 - Progressing by Leata MouseAninon, Israel Wunder S, RN Pain Managment: General experience of comfort will improve 09/07/2017 2136 - Progressing by Leata MouseAninon, Skyy Mcknight S, RN Safety: Ability to remain free from injury will improve 09/07/2017 2136 - Progressing by Leata MouseAninon, Danyeal Akens S, RN Skin Integrity: Risk for impaired skin integrity will decrease 09/07/2017 2136 - Progressing by Leata MouseAninon, Brysyn Brandenberger S, RN Education: Understanding of CV disease, CV risk reduction, and recovery process will improve 09/07/2017 2136 - Progressing by Leata MouseAninon, Woods Gangemi S, RN Activity: Ability to return to baseline activity level will improve 09/07/2017 2136 - Progressing by Leata MouseAninon, Aadhira Heffernan S, RN Cardiovascular: Ability to achieve and maintain adequate cardiovascular perfusion will improve 09/07/2017 2136 - Progressing by Leata MouseAninon, Melba Araki S, RN Vascular access site(s) Level 0-1 will be maintained 09/07/2017 2136 -  Progressing by Leata MouseAninon, Birtie Fellman S, RN Health Behavior/Discharge Planning: Ability to safely manage health-related needs after discharge will improve 09/07/2017 2136 - Progressing by Leata MouseAninon, Bingham Millette S, RN

## 2017-09-07 NOTE — Care Management Note (Signed)
Case Management Note  Patient Details  Name: Carmen Anderson MRN: [id number] Date of Birth: 07/13/1961  Subjective/Objective:  From home with spouse, pta indep, s/p coronary stent intervention, will be on brilinta, NCM gave patient the 30 day savings coupon and $5 co pay coupon.  She will be going to [facility] on [address].  NCM informed patient Cardiologist has samples in office and that she should try to use the $5 co pay card before she runs out.  And the co pay from her insurance was 80.00 but she should be able to use the $5 co pay card. Patient states she can pay the 80.00 if she has too. She will  Be going to [facility] on [address], they do have some in stock but will have to order the rest.                 Action/Plan: DC home when medically ready.   Expected Discharge Date:                  Expected Discharge Plan:  Home/Self Care  In-House Referral:     Discharge planning Services  CM Consult  Post Acute Care Choice:    Choice offered to:     DME Arranged:    DME Agency:     HH Arranged:    HH Agency:     Status of Service:  Completed, signed off  If discussed at [employer] Length of Stay Meetings, dates discussed:    Additional Comments:  Leone Havenaylor, Carmen Luebke Clinton, RN 09/07/2017, 5:25 PM

## 2017-09-07 NOTE — Interval H&P Note (Signed)
Cath Lab Visit (complete for each Cath Lab visit)  Clinical Evaluation Leading to the Procedure:   ACS: Yes.    Non-ACS:    Anginal Classification: CCS III  Anti-ischemic medical therapy: No Therapy  Non-Invasive Test Results: Intermediate-risk stress test findings: cardiac mortality 1-3%/year  Prior CABG: No previous CABG      History and Physical Interval Note:  09/07/2017 11:23 AM  Tawni Levyarla L Aliano  has presented today for surgery, with the diagnosis of NSTEMI  The various methods of treatment have been discussed with the patient and family. After consideration of risks, benefits and other options for treatment, the patient has consented to  Procedure(s): LEFT HEART CATH AND CORONARY ANGIOGRAPHY (N/A) as a surgical intervention .  The patient's history has been reviewed, patient examined, no change in status, stable for surgery.  I have reviewed the patient's chart and labs.  Questions were answered to the patient's satisfaction.     Nanetta BattyJonathan Takiera Mayo

## 2017-09-07 NOTE — Progress Notes (Signed)
#   7. S/ A ANDREA @ [facility] # [phone]    BRILINTA 90 MG BID  COVER- YES  CO-PAY- $ 80.00  TIER- 3 DRUG  PRIOR APPROVAL- NO  NO DEDUCTIBLE   PREFERRED [facility]ACY : [facility]

## 2017-09-07 NOTE — Progress Notes (Signed)
Progress Note  Patient Name: Tawni LevyCarla L Menton Date of Encounter: 09/07/2017  Primary Cardiologist: Nicki Guadalajarahomas Kelly, MD   Subjective   No CP or SOB since overnight  Inpatient Medications    Scheduled Meds: . aspirin EC  81 mg Oral Daily  . atorvastatin  80 mg Oral q1800  . gabapentin  100 mg Oral QHS  . insulin aspart  0-5 Units Subcutaneous QHS  . insulin aspart  0-9 Units Subcutaneous TID WC  . levothyroxine  50 mcg Oral QAC breakfast  . sodium chloride flush  3 mL Intravenous Q12H   Continuous Infusions: . sodium chloride    . sodium chloride 1 mL/kg/hr (09/07/17 0625)  . heparin 800 Units/hr (09/07/17 0522)  . nitroGLYCERIN     PRN Meds: sodium chloride, acetaminophen, hydrALAZINE, nitroGLYCERIN, ondansetron (ZOFRAN) IV, sodium chloride flush   Vital Signs    Vitals:   09/06/17 0507 09/06/17 1455 09/06/17 2058 09/07/17 0254  BP: (!) 129/55 (!) 108/48 115/68 (!) 120/55  Pulse: (!) 52 62 (!) 55 (!) 57  Resp: 17  17 16   Temp: 97.9 F (36.6 C) 98 F (36.7 C) 98 F (36.7 C) 98.6 F (37 C)  TempSrc: Oral Oral Oral Oral  SpO2: 99%  98% 98%  Weight: 116 lb 14.4 oz (53 kg)   117 lb 6.4 oz (53.3 kg)  Height:        Intake/Output Summary (Last 24 hours) at 09/07/2017 0845 Last data filed at 09/07/2017 0000 Gross per 24 hour  Intake 624.35 ml  Output -  Net 624.35 ml   Filed Weights   09/05/17 0430 09/06/17 0507 09/07/17 0254  Weight: 116 lb 11.2 oz (52.9 kg) 116 lb 14.4 oz (53 kg) 117 lb 6.4 oz (53.3 kg)    Telemetry    NSR - Personally Reviewed  ECG    NSR with nonspecific T wave abnormality - Personally Reviewed  Physical Exam   GEN: No acute distress.   Neck: No JVD Cardiac: RRR, no murmurs, rubs, or gallops.  Respiratory: Clear to auscultation bilaterally. GI: Soft, nontender, non-distended  MS: No edema; No deformity. Neuro:  Nonfocal  Psych: Normal affect   Labs    Chemistry Recent Labs  Lab 09/04/17 0732 09/05/17 0259  NA 136 136  K  4.1 5.2*  CL 103 106  CO2 19* 21*  GLUCOSE 165* 212*  BUN 13 16  CREATININE 0.76 0.82  CALCIUM 9.8 9.2  GFRNONAA >60 >60  GFRAA >60 >60  ANIONGAP 14 9     Hematology Recent Labs  Lab 09/05/17 0259 09/06/17 0324 09/07/17 0355  WBC 10.0 12.6* 11.8*  RBC 4.94 4.30 4.50  HGB 15.4* 13.0 13.5  HCT 46.4* 39.6 41.8  MCV 93.9 92.1 92.9  MCH 31.2 30.2 30.0  MCHC 33.2 32.8 32.3  RDW 13.5 13.5 13.5  PLT 291 278 302    Cardiac Enzymes Recent Labs  Lab 09/04/17 1458 09/04/17 2117 09/05/17 0259  TROPONINI 0.69* 0.74* 0.73*    Recent Labs  Lab 09/04/17 0741 09/04/17 1056  TROPIPOC 0.02 0.28*     BNPNo results for input(s): BNP, PROBNP in the last 168 hours.   DDimer No results for input(s): DDIMER in the last 168 hours.   Radiology    Nm Myocar Multi W/spect W/wall Motion / Ef  Result Date: 09/05/2017  Blood pressure demonstrated a hypertensive response to exercise.  Nuclear stress EF: 71%.  Upsloping ST segment depression ST segment depression of 2 mm was noted during  stress in the II, III, aVF, V4, V5 and V6 leads, beginning at 1 minutes of stress, ending at 2 minutes of stress, and returning to baseline after 1-5 minutes of recovery. After returning ST to baseline, she had TWI inferior laterally  Defect 1: There is a medium defect of moderate severity present in the mid anteroseptal, apical anterior and apical septal location.  Findings consistent with prior myocardial infarction with peri-infarct ischemia.  This is an intermediate risk study. Extreme fatigue and poor exercise tolerance.  Recommend cardiac cath Donato Schultz, MD    Cardiac Studies   Nuclear stress test 08/2017  Blood pressure demonstrated a hypertensive response to exercise.  Nuclear stress EF: 71%.  Upsloping ST segment depression ST segment depression of 2 mm was noted during stress in the II, III, aVF, V4, V5 and V6 leads, beginning at 1 minutes of stress, ending at 2 minutes of stress, and  returning to baseline after 1-5 minutes of recovery. After returning ST to baseline, she had TWI inferior laterally  Defect 1: There is a medium defect of moderate severity present in the mid anteroseptal, apical anterior and apical septal location.  Findings consistent with prior myocardial infarction with peri-infarct ischemia.  This is an intermediate risk study. Extreme fatigue and poor exercise tolerance.  2D echo 08/2017 Study Conclusions  - Left ventricle: The cavity size was normal. Wall thickness was   normal. Systolic function was normal. The estimated ejection   fraction was in the range of 60% to 65%. Wall motion was normal;   there were no regional wall motion abnormalities. Left   ventricular diastolic function parameters were normal. - Mitral valve: There was mild regurgitation. - Tricuspid valve: There was trivial regurgitation.   Patient Profile     57 y.o. female with a PMH of Type 2 DM, HLD, ?HTN (patient denies), and hypothyroidism who presents to [facility] ED with complaints of chest pain which started around 6am 09/04/17.  Assessment & Plan    1. NSTEMI -  Troponin 0.02-->0.08-->0.69-->0.74-->0.73.  -  EKG with new Biphasic TWI laterally  -  Nuclear stress test done over the weekend showed a defect in the anteroseptal, apical anterior and apical septal regions c/w prior infarct and peri infarct ischemia -  2D echo with normal LVF and no wall motion abnormalities -  will proceed with left heart cath today -  continue to cycle trop until it peaks -  continue ASA, high dose statin, IV heparin gttand IV NTG gtt.  2. HTN - BP controlled with BP 120/66mmHg this am - currently on no antihypertensive meds  3. HLD -09/05/2017: Cholesterol 259; HDL 46; LDL 191; Triglycerides 109; VLDL 22 - lipitor increased to 80mg  daily.  - HbA1c ok at 7    For questions or updates, please contact [facility] Please consult [email or web address] for contact info under  Cardiology/STEMI.      Signed, Armanda Magic, MD  09/07/2017, 8:45 AM

## 2017-09-07 NOTE — H&P (View-Only) (Signed)
Progress Note  Patient Name: Carmen LevyCarla L Anderson Date of Encounter: 09/07/2017  Primary Cardiologist: Nicki Guadalajarahomas Kelly, MD   Subjective   No CP or SOB since overnight  Inpatient Medications    Scheduled Meds: . aspirin EC  81 mg Oral Daily  . atorvastatin  80 mg Oral q1800  . gabapentin  100 mg Oral QHS  . insulin aspart  0-5 Units Subcutaneous QHS  . insulin aspart  0-9 Units Subcutaneous TID WC  . levothyroxine  50 mcg Oral QAC breakfast  . sodium chloride flush  3 mL Intravenous Q12H   Continuous Infusions: . sodium chloride    . sodium chloride 1 mL/kg/hr (09/07/17 0625)  . heparin 800 Units/hr (09/07/17 0522)  . nitroGLYCERIN     PRN Meds: sodium chloride, acetaminophen, hydrALAZINE, nitroGLYCERIN, ondansetron (ZOFRAN) IV, sodium chloride flush   Vital Signs    Vitals:   09/06/17 0507 09/06/17 1455 09/06/17 2058 09/07/17 0254  BP: (!) 129/55 (!) 108/48 115/68 (!) 120/55  Pulse: (!) 52 62 (!) 55 (!) 57  Resp: 17  17 16   Temp: 97.9 F (36.6 C) 98 F (36.7 C) 98 F (36.7 C) 98.6 F (37 C)  TempSrc: Oral Oral Oral Oral  SpO2: 99%  98% 98%  Weight: 116 lb 14.4 oz (53 kg)   117 lb 6.4 oz (53.3 kg)  Height:        Intake/Output Summary (Last 24 hours) at 09/07/2017 0845 Last data filed at 09/07/2017 0000 Gross per 24 hour  Intake 624.35 ml  Output -  Net 624.35 ml   Filed Weights   09/05/17 0430 09/06/17 0507 09/07/17 0254  Weight: 116 lb 11.2 oz (52.9 kg) 116 lb 14.4 oz (53 kg) 117 lb 6.4 oz (53.3 kg)    Telemetry    NSR - Personally Reviewed  ECG    NSR with nonspecific T wave abnormality - Personally Reviewed  Physical Exam   GEN: No acute distress.   Neck: No JVD Cardiac: RRR, no murmurs, rubs, or gallops.  Respiratory: Clear to auscultation bilaterally. GI: Soft, nontender, non-distended  MS: No edema; No deformity. Neuro:  Nonfocal  Psych: Normal affect   Labs    Chemistry Recent Labs  Lab 09/04/17 0732 09/05/17 0259  NA 136 136  K  4.1 5.2*  CL 103 106  CO2 19* 21*  GLUCOSE 165* 212*  BUN 13 16  CREATININE 0.76 0.82  CALCIUM 9.8 9.2  GFRNONAA >60 >60  GFRAA >60 >60  ANIONGAP 14 9     Hematology Recent Labs  Lab 09/05/17 0259 09/06/17 0324 09/07/17 0355  WBC 10.0 12.6* 11.8*  RBC 4.94 4.30 4.50  HGB 15.4* 13.0 13.5  HCT 46.4* 39.6 41.8  MCV 93.9 92.1 92.9  MCH 31.2 30.2 30.0  MCHC 33.2 32.8 32.3  RDW 13.5 13.5 13.5  PLT 291 278 302    Cardiac Enzymes Recent Labs  Lab 09/04/17 1458 09/04/17 2117 09/05/17 0259  TROPONINI 0.69* 0.74* 0.73*    Recent Labs  Lab 09/04/17 0741 09/04/17 1056  TROPIPOC 0.02 0.28*     BNPNo results for input(s): BNP, PROBNP in the last 168 hours.   DDimer No results for input(s): DDIMER in the last 168 hours.   Radiology    Nm Myocar Multi W/spect W/wall Motion / Ef  Result Date: 09/05/2017  Blood pressure demonstrated a hypertensive response to exercise.  Nuclear stress EF: 71%.  Upsloping ST segment depression ST segment depression of 2 mm was noted during  stress in the II, III, aVF, V4, V5 and V6 leads, beginning at 1 minutes of stress, ending at 2 minutes of stress, and returning to baseline after 1-5 minutes of recovery. After returning ST to baseline, she had TWI inferior laterally  Defect 1: There is a medium defect of moderate severity present in the mid anteroseptal, apical anterior and apical septal location.  Findings consistent with prior myocardial infarction with peri-infarct ischemia.  This is an intermediate risk study. Extreme fatigue and poor exercise tolerance.  Recommend cardiac cath Donato Schultz, MD    Cardiac Studies   Nuclear stress test 08/2017  Blood pressure demonstrated a hypertensive response to exercise.  Nuclear stress EF: 71%.  Upsloping ST segment depression ST segment depression of 2 mm was noted during stress in the II, III, aVF, V4, V5 and V6 leads, beginning at 1 minutes of stress, ending at 2 minutes of stress, and  returning to baseline after 1-5 minutes of recovery. After returning ST to baseline, she had TWI inferior laterally  Defect 1: There is a medium defect of moderate severity present in the mid anteroseptal, apical anterior and apical septal location.  Findings consistent with prior myocardial infarction with peri-infarct ischemia.  This is an intermediate risk study. Extreme fatigue and poor exercise tolerance.  2D echo 08/2017 Study Conclusions  - Left ventricle: The cavity size was normal. Wall thickness was   normal. Systolic function was normal. The estimated ejection   fraction was in the range of 60% to 65%. Wall motion was normal;   there were no regional wall motion abnormalities. Left   ventricular diastolic function parameters were normal. - Mitral valve: There was mild regurgitation. - Tricuspid valve: There was trivial regurgitation.   Patient Profile     57 y.o. female with a PMH of Type 2 DM, HLD, ?HTN (patient denies), and hypothyroidism who presents to [facility] ED with complaints of chest pain which started around 6am 09/04/17.  Assessment & Plan    1. NSTEMI -  Troponin 0.02-->0.08-->0.69-->0.74-->0.73.  -  EKG with new Biphasic TWI laterally  -  Nuclear stress test done over the weekend showed a defect in the anteroseptal, apical anterior and apical septal regions c/w prior infarct and peri infarct ischemia -  2D echo with normal LVF and no wall motion abnormalities -  will proceed with left heart cath today -  continue to cycle trop until it peaks -  continue ASA, high dose statin, IV heparin gttand IV NTG gtt.  2. HTN - BP controlled with BP 120/66mmHg this am - currently on no antihypertensive meds  3. HLD -09/05/2017: Cholesterol 259; HDL 46; LDL 191; Triglycerides 109; VLDL 22 - lipitor increased to 80mg  daily.  - HbA1c ok at 7    For questions or updates, please contact [facility] Please consult [email or web address] for contact info under  Cardiology/STEMI.      Signed, Armanda Magic, MD  09/07/2017, 8:45 AM

## 2017-09-08 ENCOUNTER — Telehealth: Payer: Self-pay | Admitting: Physician Assistant

## 2017-09-08 DIAGNOSIS — I2583 Coronary atherosclerosis due to lipid rich plaque: Secondary | ICD-10-CM

## 2017-09-08 DIAGNOSIS — I214 Non-ST elevation (NSTEMI) myocardial infarction: Secondary | ICD-10-CM

## 2017-09-08 DIAGNOSIS — I2510 Atherosclerotic heart disease of native coronary artery without angina pectoris: Secondary | ICD-10-CM

## 2017-09-08 LAB — BASIC METABOLIC PANEL
Anion gap: 7 (ref 5–15)
BUN: 12 mg/dL (ref 6–20)
CO2: 22 mmol/L (ref 22–32)
Calcium: 9.2 mg/dL (ref 8.9–10.3)
Chloride: 110 mmol/L (ref 101–111)
Creatinine, Ser: 0.66 mg/dL (ref 0.44–1.00)
GFR calc Af Amer: >60 mL/min (ref >60)
GFR calc non Af Amer: >60 mL/min (ref >60)
Glucose, Bld: 139 mg/dL — ABNORMAL HIGH (ref 65–99)
Potassium: 4.4 mmol/L (ref 3.5–5.1)
Sodium: 139 mmol/L (ref 135–145)

## 2017-09-08 LAB — CBC
HCT: 41.2 % (ref 36.0–46.0)
Hemoglobin: 13.6 g/dL (ref 12.0–15.0)
MCH: 30.4 pg (ref 26.0–34.0)
MCHC: 33 g/dL (ref 30.0–36.0)
MCV: 92.2 fL (ref 78.0–100.0)
Platelets: 264 10*3/uL (ref 150–400)
RBC: 4.47 MIL/uL (ref 3.87–5.11)
RDW: 13.3 % (ref 11.5–15.5)
WBC: 11.9 10*3/uL — ABNORMAL HIGH (ref 4.0–10.5)

## 2017-09-08 LAB — GLUCOSE, CAPILLARY
Glucose-Capillary: 123 mg/dL — ABNORMAL HIGH (ref 65–99)
Glucose-Capillary: 130 mg/dL — ABNORMAL HIGH (ref 65–99)
Glucose-Capillary: 93 mg/dL (ref 65–99)

## 2017-09-08 MED ORDER — ATORVASTATIN CALCIUM 80 MG PO TABS: 80.0000 mg | ORAL_TABLET | Freq: Every day | ORAL | 3 refills | Status: DC

## 2017-09-08 MED ORDER — TICAGRELOR 90 MG PO TABS: 90.0000 mg | ORAL_TABLET | Freq: Two times a day (BID) | ORAL | 0 refills | Status: DC

## 2017-09-08 MED ORDER — ASPIRIN 81 MG PO TBEC: 81.0000 mg | DELAYED_RELEASE_TABLET | Freq: Every day | ORAL | Status: AC

## 2017-09-08 MED ORDER — NITROGLYCERIN 0.4 MG SL SUBL: 0.4000 mg | SUBLINGUAL_TABLET | SUBLINGUAL | 3 refills | Status: DC | PRN

## 2017-09-08 MED ORDER — TICAGRELOR 90 MG PO TABS: 90.0000 mg | ORAL_TABLET | Freq: Two times a day (BID) | ORAL | 3 refills | Status: DC

## 2017-09-08 MED FILL — Heparin Sodium (Porcine) 2 Unit/ML in Sodium Chloride 0.9%: INTRAMUSCULAR | Qty: 1000 | Status: AC

## 2017-09-08 MED FILL — Lidocaine HCl Local Inj 1%: INTRAMUSCULAR | Qty: 20 | Status: AC

## 2017-09-08 NOTE — Telephone Encounter (Signed)
Current admit 2/26

## 2017-09-08 NOTE — Discharge Summary (Signed)
Discharge Summary    Patient ID: ARISTA KETTLEWELL,  MRN: [id number], DOB/AGE: 12/21/1960 57 y.o.  Admit date: 09/04/2017 Discharge date: 09/08/2017  Primary Care Provider: Doristine Bosworth Primary Cardiologist: Nicki Guadalajara, MD  Discharge Diagnoses    Principal Problem:   Non-ST elevation (NSTEMI) myocardial infarction [facility]) Active Problems:   Hyperlipidemia   Hypothyroid   Type 2 diabetes mellitus with diabetic neuropathy, without long-term current use of insulin (HCC)   Chest pain   Elevated troponin   Abnormal cardiovascular stress test   Essential hypertension   Allergies Allergies  Allergen Reactions  . Aspirin Other (See Comments)    Burns stomach  . Codeine Rash    Diagnostic Studies/Procedures    Echo 09/06/2017 LV EF: 60% -   65%  Study Conclusions  - Left ventricle: The cavity size was normal. Wall thickness was   normal. Systolic function was normal. The estimated ejection   fraction was in the range of 60% to 65%. Wall motion was normal;   there were no regional wall motion abnormalities. Left   ventricular diastolic function parameters were normal. - Mitral valve: There was mild regurgitation. - Tricuspid valve: There was trivial regurgitation.    Myoview 09/05/2017  Blood pressure demonstrated a hypertensive response to exercise.  Nuclear stress EF: 71%.  Upsloping ST segment depression ST segment depression of 2 mm was noted during stress in the II, III, aVF, V4, V5 and V6 leads, beginning at 1 minutes of stress, ending at 2 minutes of stress, and returning to baseline after 1-5 minutes of recovery. After returning ST to baseline, she had TWI inferior laterally  Defect 1: There is a medium defect of moderate severity present in the mid anteroseptal, apical anterior and apical septal location.  Findings consistent with prior myocardial infarction with peri-infarct ischemia.  This is an intermediate risk study. Extreme fatigue and poor  exercise tolerance.   Cath 09/07/2017 Conclusion     Prox LAD to Mid LAD lesion is 90% stenosed.  A stent was successfully placed.  Post intervention, there is a 0% residual stenosis.   _____________   History of Present Illness     Ms. Kastens is a 57 y.o. female with a PMH of Type 2 DM, HLD, ?HTN (patient denies), and hypothyroidism who presents to [facility] ED with complaints of chest pain which started around 6am 09/04/17. Patient states she was in her usual state of health until this morning. While getting ready for work this AM around [place], she experienced mild chest discomfort. Approximately 30 minutes later when putting her things in her car, her chest pain increased. She experienced left sided chest pressure with pain radiating to her left arm and associated SOB. She states the pain lasted 2-3 minutes and was relieved with rest. Given that she had never experienced chest pressure like this before she decided to present to the ED for further evaluation. On arrival to the ED she was CP free. She states she did have another episode around 12:30 which she associated with increased activity getting on/off a bed pan. Again the episode lasted 2-3 minutes and was associated with L arm pain and SOB. She denies associated diaphoresis, nausea, vomiting, lightheadedness, dizziness, pre-syncope, or syncope with either episode. Prior to today, she denies experiencing chest pain, SOB, DOE, orthopnea, PND, or LE edema. She is fairly active, able to walk any distance without CP or SOB, and able to walk up multiple flights of stairs without symptoms. She denies  recent illness, fever, abdominal pain, nausea, vomiting, diarrhea, difficulty urinating, hematuria, melena, hematochezia. She had an exercise stress test 6-8 years ago as part of a routine evaluation which was normal.   ED course: afebrile, hypertensive on arrival (max 166/80) now improved, mild bradycardia, satting well on RA. Labs notable for electrolytes  wnl, Cr 0.76, WBC 13.2, Hgb 14.6, PLT 330, Trop 0.02>0.28. EKG with NSR no STE/D, no TWI. Patient declined ASA given GI upset with non-coated ASA. Cardiology asked to see for further work-up.     Hospital Course      Patient was ruled in for NSTEMI by enzyme.  He underwent Myoview on 09/05/2017 which showed EF 71%, 2 mm ST depression noted in inferolateral leads.  Medium defect of moderate severity present in the mid anteroseptal, apical anterior, apical septal location finding consistent with prior MI was peri-infarct ischemia.  Echocardiogram obtained on 09/06/2017 showed EF 60-65%, mild MR.  He eventually underwent cardiac catheterization on 09/07/2017 which showed 90% proximal to mid LAD treated with DES.  Post-cath, patient was placed on 81 mg aspirin, high-dose statin and Brilinta.  Patient was seen in the morning of 09/08/2017, at which time she was doing well without significant discomfort.  She is not on a beta-blocker due to baseline bradycardia.  She will need fasting lipid panel and LFT in 6-8 weeks.  LDL was 191 in the hospital.  It was also recommended for the primary care provider to consider changing the patient [facility] Januvia [facility] given elevated hemoglobin A1c of 7.  Patient has been instructed to resume metformin in 24 hours which is actually 48 hours after cardiac catheterization.  _____________  Discharge Vitals Blood pressure (!) 134/51, pulse (!) 58, temperature 98.1 F (36.7 C), temperature source Oral, resp. rate 17, height 5\' 3"  (1.6 m), weight 120 lb 2.4 oz (54.5 kg), SpO2 100 %.  Filed Weights   09/06/17 0507 09/07/17 0254 09/08/17 0353  Weight: 116 lb 14.4 oz (53 kg) 117 lb 6.4 oz (53.3 kg) 120 lb 2.4 oz (54.5 kg)    Labs & Radiologic Studies    CBC Recent Labs    09/07/17 0355 09/08/17 0344  WBC 11.8* 11.9*  HGB 13.5 13.6  HCT 41.8 41.2  MCV 92.9 92.2  PLT 302 264   Basic Metabolic Panel Recent Labs    16/04/9601/26/19 0344  NA 139  K 4.4  CL 110  CO2 22    GLUCOSE 139*  BUN 12  CREATININE 0.66  CALCIUM 9.2   Liver Function Tests No results for input(s): AST, ALT, ALKPHOS, BILITOT, PROT, ALBUMIN in the last 72 hours. No results for input(s): LIPASE, AMYLASE in the last 72 hours. Cardiac Enzymes No results for input(s): CKTOTAL, CKMB, CKMBINDEX, TROPONINI in the last 72 hours. BNP Invalid input(s): POCBNP D-Dimer No results for input(s): DDIMER in the last 72 hours. Hemoglobin A1C Recent Labs    09/06/17 0851  HGBA1C 6.9*   Fasting Lipid Panel No results for input(s): CHOL, HDL, LDLCALC, TRIG, CHOLHDL, LDLDIRECT in the last 72 hours. Thyroid Function Tests No results for input(s): TSH, T4TOTAL, T3FREE, THYROIDAB in the last 72 hours.  Invalid input(s): FREET3 _____________  Dg Chest 2 View  Result Date: 09/04/2017 CLINICAL DATA:  Chest pain EXAM: CHEST  2 VIEW COMPARISON:  None. FINDINGS: Heart and mediastinal contours are within normal limits. No focal opacities or effusions. No acute bony abnormality. IMPRESSION: No active cardiopulmonary disease. Electronically Signed   By: Charlett NoseKevin  Dover M.D.  On: 09/04/2017 08:00   Nm Myocar Multi W/spect W/wall Motion / Ef  Result Date: 09/05/2017  Blood pressure demonstrated a hypertensive response to exercise.  Nuclear stress EF: 71%.  Upsloping ST segment depression ST segment depression of 2 mm was noted during stress in the II, III, aVF, V4, V5 and V6 leads, beginning at 1 minutes of stress, ending at 2 minutes of stress, and returning to baseline after 1-5 minutes of recovery. After returning ST to baseline, she had TWI inferior laterally  Defect 1: There is a medium defect of moderate severity present in the mid anteroseptal, apical anterior and apical septal location.  Findings consistent with prior myocardial infarction with peri-infarct ischemia.  This is an intermediate risk study. Extreme fatigue and poor exercise tolerance.  Recommend cardiac cath Donato Schultz, MD   Dg Foot  Complete Right  Result Date: 09/01/2017 Please see detailed radiograph report in office note.  Disposition   Pt is being discharged home today in good condition.  Follow-up Plans & Appointments    Follow-up Information    Azalee Course, [place] Follow up on 09/14/2017.   Specialties:  Cardiology, Radiology Why:  @11 :00AM. Followup with Azalee Course. Dr. Landry Dyke PA Contact information: [address] [place] [address] [phone]        Doristine Bosworth, MD. Schedule an appointment as soon as possible for a visit.   Specialty:  Internal Medicine Contact information: [address] [place] [address] [phone]          Discharge Instructions    AMB Referral to Cardiac Rehabilitation - Phase II   Complete by:  As directed    Diagnosis:  Coronary Stents   Amb Referral to Cardiac Rehabilitation   Complete by:  As directed    Diagnosis:   NSTEMI Coronary Stents        Discharge Medications   Allergies as of 09/08/2017      Reactions   Aspirin Other (See Comments)   Burns stomach   Codeine Rash      Medication List    STOP taking these medications   lisinopril 5 MG tablet Commonly known as:  PRINIVIL,ZESTRIL     TAKE these medications   aspirin 81 MG EC tablet Take 1 tablet (81 mg total) by mouth daily. Start taking on:  09/09/2017   atorvastatin 80 MG tablet Commonly known as:  LIPITOR Take 1 tablet (80 mg total) by mouth daily at 6 PM. What changed:    medication strength  how much to take  when to take this   gabapentin 100 MG capsule Commonly known as:  NEURONTIN Take 1 capsule (100 mg total) by mouth at bedtime.   levothyroxine 50 MCG tablet Commonly known as:  SYNTHROID, LEVOTHROID Take 1 tablet (50 mcg total) by mouth daily.   meloxicam 15 MG tablet Commonly known as:  MOBIC Take 1 tablet (15 mg total) by mouth daily.   metFORMIN 500 MG 24 hr tablet Commonly known as:  GLUCOPHAGE-XR TAKE 1 TABLET BY MOUTH TWICE DAILY  WITH MEALS AS DIRECTED   methylPREDNISolone 4 MG Tbpk tablet Commonly known as:  MEDROL DOSEPAK 6 day dose pack - take as directed   nitroGLYCERIN 0.4 MG SL tablet Commonly known as:  NITROSTAT Place 1 tablet (0.4 mg total) under the tongue every 5 (five) minutes x 3 doses as needed for chest pain.   sitaGLIPtin 100 MG tablet Commonly known as:  JANUVIA Take 1 tablet (100 mg total) by  mouth daily.   ticagrelor 90 MG Tabs tablet Commonly known as:  BRILINTA Take 1 tablet (90 mg total) by mouth 2 (two) times daily.        Aspirin prescribed at discharge?  Yes High Intensity Statin Prescribed? (Lipitor 40-80mg  or Crestor 20-40mg ): Yes Beta Blocker Prescribed? No: baseline bradycardia For EF <40%, was ACEI/ARB Prescribed? No: EF normal ADP Receptor Inhibitor Prescribed? (i.e. Plavix etc.-Includes Medically Managed Patients): Yes, brilinta For EF <40%, Aldosterone Inhibitor Prescribed? No: EF normal Was EF assessed during THIS hospitalization? Yes Was Cardiac Rehab II ordered? (Included Medically managed Patients): Yes   Outstanding Labs/Studies   Pending 6-8 FLP and LFT  Duration of Discharge Encounter   Greater than 30 minutes including physician time.  Ramond Dial NP 09/08/2017, 11:20 AM

## 2017-09-08 NOTE — Progress Notes (Addendum)
Progress Note  Patient Name: Carmen LevyCarla L Anderson Date of Encounter: 09/08/2017  Primary Cardiologist: Nicki Guadalajarahomas Kelly, MD   Subjective   Denies any further CP or SOB  Inpatient Medications    Scheduled Meds: . aspirin EC  81 mg Oral Daily  . atorvastatin  80 mg Oral q1800  . gabapentin  100 mg Oral QHS  . insulin aspart  0-5 Units Subcutaneous QHS  . insulin aspart  0-9 Units Subcutaneous TID WC  . levothyroxine  50 mcg Oral QAC breakfast  . sodium chloride flush  3 mL Intravenous Q12H  . ticagrelor  90 mg Oral BID   Continuous Infusions: . sodium chloride    . nitroGLYCERIN     PRN Meds: sodium chloride, acetaminophen, hydrALAZINE, nitroGLYCERIN, ondansetron (ZOFRAN) IV, sodium chloride flush   Vital Signs    Vitals:   09/07/17 1506 09/07/17 2046 09/08/17 0353 09/08/17 0730  BP: (!) 153/56 (!) 133/49 139/61 (!) 134/51  Pulse: 61 60 (!) 54 (!) 58  Resp: 15 17 13 17   Temp: 98 F (36.7 C) 98.1 F (36.7 C) (!) 97.5 F (36.4 C) 98.1 F (36.7 C)  TempSrc: Oral Oral Oral Oral  SpO2: 100% 99% 100% 100%  Weight:   120 lb 2.4 oz (54.5 kg)   Height:        Intake/Output Summary (Last 24 hours) at 09/08/2017 0859 Last data filed at 09/08/2017 0700 Gross per 24 hour  Intake 840 ml  Output 3450 ml  Net -2610 ml   Filed Weights   09/06/17 0507 09/07/17 0254 09/08/17 0353  Weight: 116 lb 14.4 oz (53 kg) 117 lb 6.4 oz (53.3 kg) 120 lb 2.4 oz (54.5 kg)    Telemetry    NSR - Personally Reviewed  ECG    No new EKG to review - Personally Reviewed  Physical Exam   GEN: No acute distress.   Neck: No JVD Cardiac: RRR, no murmurs, rubs, or gallops.  Respiratory: Clear to auscultation bilaterally. GI: Soft, nontender, non-distended  MS: No edema; No deformity. Neuro:  Nonfocal  Psych: Normal affect   Labs    Chemistry Recent Labs  Lab 09/04/17 0732 09/05/17 0259 09/08/17 0344  NA 136 136 139  K 4.1 5.2* 4.4  CL 103 106 110  CO2 19* 21* 22  GLUCOSE 165* 212*  139*  BUN 13 16 12   CREATININE 0.76 0.82 0.66  CALCIUM 9.8 9.2 9.2  GFRNONAA >60 >60 >60  GFRAA >60 >60 >60  ANIONGAP 14 9 7      Hematology Recent Labs  Lab 09/06/17 0324 09/07/17 0355 09/08/17 0344  WBC 12.6* 11.8* 11.9*  RBC 4.30 4.50 4.47  HGB 13.0 13.5 13.6  HCT 39.6 41.8 41.2  MCV 92.1 92.9 92.2  MCH 30.2 30.0 30.4  MCHC 32.8 32.3 33.0  RDW 13.5 13.5 13.3  PLT 278 302 264    Cardiac Enzymes Recent Labs  Lab 09/04/17 1458 09/04/17 2117 09/05/17 0259  TROPONINI 0.69* 0.74* 0.73*    Recent Labs  Lab 09/04/17 0741 09/04/17 1056  TROPIPOC 0.02 0.28*     BNPNo results for input(s): BNP, PROBNP in the last 168 hours.   DDimer No results for input(s): DDIMER in the last 168 hours.   Radiology    No results found.  Cardiac Studies   Nuclear stress test 08/2017  Blood pressure demonstrated a hypertensive response to exercise.  Nuclear stress EF: 71%.  Upsloping ST segment depression ST segment depression of 2 mm was noted  during stress in the II, III, aVF, V4, V5 and V6 leads, beginning at 1 minutes of stress, ending at 2 minutes of stress, and returning to baseline after 1-5 minutes of recovery. After returning ST to baseline, she had TWI inferior laterally  Defect 1: There is a medium defect of moderate severity present in the mid anteroseptal, apical anterior and apical septal location.  Findings consistent with prior myocardial infarction with peri-infarct ischemia.  This is an intermediate risk study. Extreme fatigue and poor exercise tolerance.  2D echo 08/2017 Study Conclusions  - Left ventricle: The cavity size was normal. Wall thickness was normal. Systolic function was normal. The estimated ejection fraction was in the range of 60% to 65%. Wall motion was normal; there were no regional wall motion abnormalities. Left ventricular diastolic function parameters were normal. - Mitral valve: There was mild regurgitation. - Tricuspid  valve: There was trivial regurgitation.  Cardiac Cath 08/2017 Conclusion     Prox LAD to Mid LAD lesion is 90% stenosed.  A stent was successfully placed.  Post intervention, there is a 0% residual stenosis.     Patient Profile     57 y.o. female with a PMH of Type 2 DM, HLD, ?HTN (patient denies), and hypothyroidism who presents to [facility] ED with complaints of chest pain which started around 6am 09/04/17.   Assessment & Plan    1. NSTEMI - Troponin 0.02-->0.08-->0.69-->0.74-->0.73.  -EKG with new Biphasic TWI laterally  -Nuclear stress test done over the weekend showeda defect in the anteroseptal, apical anterior and apical septal regions c/w prior infarct and peri infarct ischemia -  2D echo with normal LVF and no wall motion abnormalities -  cath revealed 90% prox to mid LAD s/p PCI with DES - continue DAPT with ASA 81mg  daily and Brilinta 90mg  BID.  -  Continue high dose statin -  No BB due to bradycardia  2. HTN - BP is well controlled - restart home dose of ACE I at discharge  3. HLD -09/05/2017: Cholesterol 259; HDL 46; LDL 191; Triglycerides 109; VLDL 22 - lipitor increased to 80mg  daily.  - will need FLP and ALT in 6 weeks  4.  Type 2 DM -  HbA1c  7 - consider changing from Januvia to Jardiance as outpt with PCP - restart Metformin in 24 hours  Patient doing well ambulating in hall and stable for discharge home.  Will get TOC followup in office.    For questions or updates, please contact [facility] Please consult [email or web address] for contact info under Cardiology/STEMI.      Signed, Armanda Magic, MD  09/08/2017, 8:59 AM

## 2017-09-08 NOTE — Progress Notes (Signed)
CARDIAC REHAB PHASE I   PRE:  Rate/Rhythm: 60 SR  BP:  Supine:   Sitting: 116/44  Standing:    SaO2:   MODE:  Ambulation: 550 ft   POST:  Rate/Rhythm: 70 SR  BP:  Supine:   Sitting: 119/53  Standing:    SaO2:  0950-1045 Pt walked 550 ft on RA with steady gait and no CP. Tolerated well. MI education completed with pt and husband who voiced understanding. Stressed importance of brilinta with stent. Has seen case Production designer, theatre/television/filmmanager. Reviewed MI restrictions, NTG use, risk factors, ex ed, carb counting and heart healthy food choices, and CRP 2. [facility]. Discussed smoking cessation and offered fake cigarette. Gave smoking cessation handout.   Luetta Nuttingharlene Abbegayle Denault, RN BSN  09/08/2017 10:41 AM

## 2017-09-08 NOTE — Progress Notes (Signed)
TR BAND REMOVAL  LOCATION:    right radial  DEFLATED PER PROTOCOL:    Yes.    TIME BAND OFF / DRESSING APPLIED:    2015   SITE UPON ARRIVAL:    Level 0  SITE AFTER BAND REMOVAL:    Level 0  CIRCULATION SENSATION AND MOVEMENT:    Within Normal Limits   Yes.    COMMENTS:   TOLERATED PROCEDURE WELL

## 2017-09-08 NOTE — Telephone Encounter (Signed)
[facility] @ 11am with Azalee CourseHao Meng

## 2017-09-08 NOTE — Discharge Instructions (Signed)
No driving for 24 hours. No lifting over 5 lbs for 1 week. No sexual activity for 1 week. Keep procedure site clean & dry. If you notice increased pain, swelling, bleeding or pus, call/return!  You may shower, but no soaking baths/hot tubs/pools for 1 week.   Please hold metformin for 24 hours after discharge

## 2017-09-09 NOTE — Telephone Encounter (Signed)
Patient contacted [facility] on 09/08/17.  Patient understands to follow up with provider Harrell LarkH. Meng PA on 09/14/17 at [facility]. Patient understands discharge instructions? YES Patient understands medications and regiment? YES Patient understands to bring all medications to this visit? YES

## 2017-09-10 ENCOUNTER — Telehealth: Payer: Self-pay

## 2017-09-10 ENCOUNTER — Telehealth: Payer: Self-pay | Admitting: Internal Medicine

## 2017-09-10 MED ORDER — EMPAGLIFLOZIN 10 MG PO TABS: ORAL_TABLET | ORAL | 2 refills | Status: DC

## 2017-09-10 NOTE — Telephone Encounter (Signed)
Transition Care Management Follow-up Telephone Call   Date discharged? 09/08/17   How have you been since you were released from the hospital? Patient states that she is feeling okay since being home from the hospital.    Do you understand why you were in the hospital? yes   Do you understand the discharge instructions? yes   Where were you discharged to? home   Items Reviewed:  Medications reviewed: yes  Allergies reviewed: yes  Dietary changes reviewed: yes  Referrals reviewed: yes   Functional Questionnaire:   Activities of Daily Living (ADLs):   She states they are independent in the following: ambulation, bathing and hygiene, feeding, continence, grooming, toileting and dressing States they require assistance with the following: none   Any transportation issues/concerns?: no   Any patient concerns? no   Confirmed importance and date/time of follow-up visits scheduled yes  Provider Appointment booked with Dr. Creta LevinStallings on 09/12/17 @ 11:40 am.   Confirmed with patient if condition begins to worsen call PCP or go to the ER.  Patient was given the office number and encouraged to call back with question or concerns.  : yes

## 2017-09-10 NOTE — Telephone Encounter (Signed)
Pt advised and voiced understanding.  Rx sent.  

## 2017-09-10 NOTE — Telephone Encounter (Signed)
Patient was in hospital last week for heart issues and had a stent put in and the dr wanted to take her off the sitaGLIPtin (JANUVIA) 100 MG tablet and stated that it would be better for her heart Jardiance  She was told to follow up with our Dr about this new medication to get her a prescription sent in for this.   Please advise.

## 2017-09-10 NOTE — Telephone Encounter (Signed)
Pt's next routine f/u is 12/14/17. Please advise. Thanks.

## 2017-09-10 NOTE — Telephone Encounter (Signed)
OK to make the switch to Jardiance 10 mg daily before b'fast. Needs to stay very well hydrated.

## 2017-09-11 NOTE — Progress Notes (Signed)
Chief Complaint  Patient presents with  . Transitions Of Care    mild MI with 90% block 1 stent placed 02/25.  d/c'ed 02/26.      HPI   Patient is here for hospital follow up   She was admitted on 09/04/2017  Discharged on 09/08/2017 Active diagnosis for admission included  Chest pain with elevated troponins Abnormal stress test NSTEMI  Patient was admitted from the ER for ACS with findings of normal ecg and elevated troponins Her work up included Echo LV EF 60-65% Cath on 09/07/17 showed proximal LAD to mid LAD with 90% stenosis  A stent was placed and residual stenosis 0% Risk factors for heart attack included DM and Hypertension as well as dyslipidemia   Diabetes Mellitus: Patient presents for follow up of diabetes. Symptoms: none. Symptoms have stabilized. Patient denies hyperglycemia, hypoglycemia , nausea, paresthesia of the feet, polydipsia and polyuria.  Evaluation to date has been included: hemoglobin A1C.  Home sugars: BGs consistently in an acceptable range  Lab Results  Component Value Date   HGBA1C 6.9 (H) 09/06/2017   Recommendations were made to change Januvia to Jardiance. Her Endocrinologist changed her medication to [place].  Continue Metformin Maintain a1c within range  Hypertension: Patient here for follow-up of elevated blood pressure. She is not exercising and is adherent to low salt diet.  Blood pressure is well controlled at home. Cardiac symptoms none. Patient denies chest pain, chest pressure/discomfort, dyspnea and irregular heart beat.  Cardiovascular risk factors: diabetes mellitus, dyslipidemia, hypertension and smoking/ tobacco exposure. Use of agents associated with hypertension: thyroid hormones. History of target organ damage: angina/ prior myocardial infarction.   Past Medical History:  Diagnosis Date  . DM type 2 (diabetes mellitus, type 2) (HCC)   . History of hiatal hernia   . Hyperlipidemia   . Hypertension   . Hypothyroidism      Current Outpatient Medications  Medication Sig Dispense Refill  . aspirin EC 81 MG EC tablet Take 1 tablet (81 mg total) by mouth daily.    [name] atorvastatin (LIPITOR) 80 MG tablet Take 1 tablet (80 mg total) by mouth daily at 6 PM. 90 tablet 3  . empagliflozin (JARDIANCE) 10 MG TABS tablet Take 10mg  daily before breakfast, drink plenty of water. 30 tablet 2  . gabapentin (NEURONTIN) 100 MG capsule Take 1 capsule (100 mg total) by mouth at bedtime. 90 capsule 3  . levothyroxine (SYNTHROID, LEVOTHROID) 50 MCG tablet Take 1 tablet (50 mcg total) by mouth daily. 90 tablet 3  . meloxicam (MOBIC) 15 MG tablet Take 1 tablet (15 mg total) by mouth daily. (Patient not taking: Reported on 09/12/2017) 30 tablet 3  . metFORMIN (GLUCOPHAGE-XR) 500 MG 24 hr tablet TAKE 1 TABLET BY MOUTH TWICE DAILY WITH MEALS AS DIRECTED 180 tablet 3  . methylPREDNISolone (MEDROL DOSEPAK) 4 MG TBPK tablet 6 day dose pack - take as directed (Patient not taking: Reported on 09/12/2017) 21 tablet 0  . nitroGLYCERIN (NITROSTAT) 0.4 MG SL tablet Place 1 tablet (0.4 mg total) under the tongue every 5 (five) minutes x 3 doses as needed for chest pain. 25 tablet 3  . ticagrelor (BRILINTA) 90 MG TABS tablet Take 1 tablet (90 mg total) by mouth 2 (two) times daily. 180 tablet 3   No current facility-administered medications for this visit.     Allergies:  Allergies  Allergen Reactions  . Aspirin Other (See Comments)    Burns stomach  . Codeine Rash    Past  Surgical History:  Procedure Laterality Date  . ABDOMINAL HYSTERECTOMY  1995  . CORONARY STENT INTERVENTION N/A 09/07/2017   Procedure: CORONARY STENT INTERVENTION;  Surgeon: Runell Gess, MD;  Location: [facility] INVASIVE CV LAB;  Service: Cardiovascular;  Laterality: N/A;  . LEFT HEART CATH AND CORONARY ANGIOGRAPHY N/A 09/07/2017   Procedure: LEFT HEART CATH AND CORONARY ANGIOGRAPHY;  Surgeon: Runell Gess, MD;  Location: [facility] INVASIVE CV LAB;  Service: Cardiovascular;   Laterality: N/A;  . STAPEDECTOMY Right ~ 2002    Social History   Socioeconomic History  . Marital status: Married    Spouse name: None  . Number of children: None  . Years of education: 31  . Highest education level: None  Social Needs  . Financial resource strain: None  . Food insecurity - worry: None  . Food insecurity - inability: None  . Transportation needs - medical: None  . Transportation needs - non-medical: None  Occupational History  . Occupation: property asst. Event organiser: [employer]  Tobacco Use  . Smoking status: Former Smoker    Packs/day: 0.50    Years: 27.00    Pack years: 13.50    Types: Cigarettes    Last attempt to quit: 09/05/2017    Years since quitting: 0.0  . Smokeless tobacco: Never Used  Substance and Sexual Activity  . Alcohol use: No  . Drug use: No  . Sexual activity: Yes    Birth control/protection: None  Other Topics Concern  . None  Social History Narrative   Exercise: walking, 4 times/week for 30 minutes.    Family History  Problem Relation Age of Onset  . Diabetes Mother   . Heart disease Mother   . Stroke Father        2011  . Hyperlipidemia Father   . Diabetes Sister   . Heart disease Sister   . Heart disease Brother        congenital     ROS Review of Systems See HPI Constitution: No fevers or chills No malaise No diaphoresis Skin: No rash or itching Eyes: no blurry vision, no double vision GU: no dysuria or hematuria Neuro: no dizziness or headaches  all others reviewed and negative   Objective: Vitals:   09/12/17 1155  BP: 118/68  Pulse: 69  Resp: 16  Temp: 97.8 F (36.6 C)  TempSrc: Oral  SpO2: 98%  Weight: 116 lb (52.6 kg)  Height: 5' 3.78" (1.62 m)    Physical Exam  Constitutional: She is oriented to person, place, and time. She appears well-developed and well-nourished.  HENT:  Head: Normocephalic and atraumatic.  Eyes: Conjunctivae and EOM are normal.  Neck:  Normal range of motion. No thyromegaly present.  Cardiovascular: Normal rate, regular rhythm and normal heart sounds.  Pulmonary/Chest: Effort normal and breath sounds normal. No stridor. No respiratory distress. She has no wheezes.  Neurological: She is alert and oriented to person, place, and time.  Skin: Skin is warm. Capillary refill takes less than 2 seconds.  Psychiatric: She has a normal mood and affect. Her behavior is normal. Judgment and thought content normal.    Assessment and Plan Carmen Anderson was seen today for transitions of care.  Diagnoses and all orders for this visit:  Non-ST elevation (NSTEMI) myocardial infarction [facility])- she is currently on Brilinta, ASA, Lipitor, Jardiance  She understands how and when to take her nitrostat  Essential hypertension- bp in good range  Mixed hyperlipidemia- discussed her goals for  lipid management  Tobacco user- she is quitting with nicotine gum  Diabetes Mellitus- discussed that glycemic control is very important She is tolerating Jardiance well  Other orders -     Flu Vaccine QUAD 36+ mos IM    Follow up appt with Cardiology 09/14/17 She was referred to Cardiac rehab   A total of 40 minutes were spent face-to-face with the patient during this encounter and over half of that time was spent on counseling and coordination of care.  Genie Wenke A Neena Beecham

## 2017-09-12 ENCOUNTER — Other Ambulatory Visit: Payer: Self-pay

## 2017-09-12 ENCOUNTER — Encounter: Payer: Self-pay | Admitting: Family Medicine

## 2017-09-12 ENCOUNTER — Ambulatory Visit: Payer: BLUE CROSS/BLUE SHIELD | Admitting: Family Medicine

## 2017-09-12 VITALS — BP 118/68 | HR 69 | Temp 97.8°F | Resp 16 | Ht 63.78 in | Wt 116.0 lb

## 2017-09-12 DIAGNOSIS — I10 Essential (primary) hypertension: Secondary | ICD-10-CM | POA: Diagnosis not present

## 2017-09-12 DIAGNOSIS — E1159 Type 2 diabetes mellitus with other circulatory complications: Secondary | ICD-10-CM

## 2017-09-12 DIAGNOSIS — I214 Non-ST elevation (NSTEMI) myocardial infarction: Secondary | ICD-10-CM

## 2017-09-12 DIAGNOSIS — Z720 Tobacco use: Secondary | ICD-10-CM | POA: Diagnosis not present

## 2017-09-12 DIAGNOSIS — E782 Mixed hyperlipidemia: Secondary | ICD-10-CM

## 2017-09-12 DIAGNOSIS — Z09 Encounter for follow-up examination after completed treatment for conditions other than malignant neoplasm: Secondary | ICD-10-CM

## 2017-09-12 NOTE — Patient Instructions (Addendum)
Aspirin and Your Heart  Aspirin is a medicine that affects the way blood clots. Aspirin can be used to help reduce the risk of blood clots, heart attacks, and other heart-related problems.  Should I take aspirin?  Your health care provider will help you determine whether it is safe and beneficial for you to take aspirin daily. Taking aspirin daily may be beneficial if you:   Have had a heart attack or chest pain.   Have undergone open heart surgery such as coronary artery bypass surgery (CABG).   Have had coronary angioplasty.   Have experienced a stroke or transient ischemic attack (TIA).   Have peripheral vascular disease (PVD).   Have chronic heart rhythm problems such as atrial fibrillation.    Are there any risks of taking aspirin daily?  Daily use of aspirin can increase your risk of side effects. Some of these include:   Bleeding. Bleeding problems can be minor or serious. An example of a minor problem is a cut that does not stop bleeding. An example of a more serious problem is stomach bleeding or bleeding into the brain. Your risk of bleeding is increased if you are also taking non-steroidal anti-inflammatory medicine (NSAIDs).   Increased bruising.   Upset stomach.   An allergic reaction. People who have nasal polyps have an increased risk of developing an aspirin allergy.    What are some guidelines I should follow when taking aspirin?   Take aspirin only as directed by your health care provider. Make sure you understand how much you should take and what form you should take. The two forms of aspirin are:  ? Non-enteric-coated. This type of aspirin does not have a coating and is absorbed quickly. Non-enteric-coated aspirin is usually recommended for people with chest pain. This type of aspirin also comes in a chewable form.  ? Enteric-coated. This type of aspirin has a special coating that releases the medicine very slowly. Enteric-coated aspirin causes less stomach upset than  non-enteric-coated aspirin. This type of aspirin should not be chewed or crushed.   Drink alcohol in moderation. Drinking alcohol increases your risk of bleeding.  When should I seek medical care?   You have unusual bleeding or bruising.   You have stomach pain.   You have an allergic reaction. Symptoms of an allergic reaction include:  ? Hives.  ? Itchy skin.  ? Swelling of the lips, tongue, or face.   You have ringing in your ears.  When should I seek immediate medical care?   Your bowel movements are bloody, dark red, or black in color.   You vomit or cough up blood.   You have blood in your urine.   You cough, wheeze, or feel short of breath.  If you have any of the following symptoms, this is an emergency. Do not wait to see if the pain will go away. Get medical help at once. Call your local emergency services (911 in the [place] Do not drive yourself to the hospital.   You have severe chest pain, especially if the pain is crushing or pressure-like and spreads to the arms, back, neck, or jaw.   You have stroke-like symptoms, such as:  ? Loss of vision.  ? Difficulty talking.  ? Numbness or weakness on one side of your body.  ? Numbness or weakness in your arm or leg.  ? Not thinking clearly or feeling confused.    This information is not intended to replace advice given to you   by your health care provider. Make sure you discuss any questions you have with your health care provider.  Document Released: 06/12/2008 Document Revised: 11/07/2015 Document Reviewed: 10/05/2013  Elsevier Interactive Patient Education  2018 [employer] Inc[employer]

## 2017-09-14 ENCOUNTER — Ambulatory Visit: Payer: BLUE CROSS/BLUE SHIELD | Admitting: Physician Assistant

## 2017-09-14 ENCOUNTER — Encounter: Payer: Self-pay | Admitting: *Deleted

## 2017-09-14 ENCOUNTER — Encounter: Payer: Self-pay | Admitting: Physician Assistant

## 2017-09-14 ENCOUNTER — Telehealth: Payer: Self-pay | Admitting: Physician Assistant

## 2017-09-14 VITALS — BP 120/78 | HR 62 | Ht 63.5 in | Wt 115.2 lb

## 2017-09-14 DIAGNOSIS — I10 Essential (primary) hypertension: Secondary | ICD-10-CM | POA: Diagnosis not present

## 2017-09-14 DIAGNOSIS — I2510 Atherosclerotic heart disease of native coronary artery without angina pectoris: Secondary | ICD-10-CM

## 2017-09-14 DIAGNOSIS — E119 Type 2 diabetes mellitus without complications: Secondary | ICD-10-CM

## 2017-09-14 DIAGNOSIS — E785 Hyperlipidemia, unspecified: Secondary | ICD-10-CM | POA: Diagnosis not present

## 2017-09-14 DIAGNOSIS — Z87891 Personal history of nicotine dependence: Secondary | ICD-10-CM

## 2017-09-14 DIAGNOSIS — E039 Hypothyroidism, unspecified: Secondary | ICD-10-CM

## 2017-09-14 MED ORDER — LISINOPRIL 5 MG PO TABS: 5.0000 mg | ORAL_TABLET | Freq: Every day | ORAL | 3 refills | Status: DC

## 2017-09-14 NOTE — Telephone Encounter (Signed)
Patient has been made aware that the letter will be faxed today for her. She verbalized her understanding.

## 2017-09-14 NOTE — Telephone Encounter (Signed)
Patient calling, states that she was released to go back work this morning at her visit with Azalee CourseHao Meng.   Patient would like to know if she could have a letter with written permission to return to work, patient is set to start Next Monday. Ms. Laureen OchsCurrie would like to know if the office could fax letter to her HR department [phone] Sherrell PullerATTN Sharon Hunt,  If not patient would like to come pick up letter

## 2017-09-14 NOTE — Telephone Encounter (Signed)
The return to work letter has been faxed to [employer]ée La[employer]Sharon Hunt at [phone].

## 2017-09-14 NOTE — Progress Notes (Signed)
Cardiology Office Note    Date:  09/14/2017   ID:  [name], DOB 23-Jul-1960, MRN [id number]  PCP:  Doristine Bosworth, MD  Cardiologist:  Dr. Tresa Endo   Chief Complaint  Patient presents with  . Transitions Of Care    seen for Dr. Tresa Endo, post PCI    History of Present Illness:  Carmen Anderson is a 57 y.o. female with PMH of HLD, DM II and hypothyroidism who recently presented to the hospital with chest pain and ruled in for NSTEMI.  She has a history of GI upset with aspirin.  Treadmill Myoview showed a medium defect of moderate severity present in the mid anteroseptal, apical anterior, apical septal location consistent with prior MI with peri-infarct ischemia, overall considered intermediate risk study.  Echocardiogram obtained on 09/06/2017 showed EF 60-65%, mild MR.  He eventually underwent cardiac catheterization on 09/07/2017 which showed 90% proximal to mid LAD lesion treated with DES.   She presents today for cardiology office visit.  She says her pain was a substernal chest pain radiating down the left arm along with some mild degree of shortness of breath at the time of heart attack.  She has not really experienced any of the symptom.  She has been compliant with both enteric-coated aspirin and also Brilinta.  She has not had any significant GI side effect on the enteric-coated aspirin.  Emphasis has been placed on compliance with dual antiplatelet therapy.  Her recent LDL was very high at 190, she is on high-dose Lipitor after recent stent placement, I plan to obtain fasting lipid panel and LFT in 6-8 weeks.  Her lisinopril was recently held due to blood pressure issues, her blood pressure has normalized, given diabetes, I will restart lisinopril for both blood pressure and renal protection.  I did not start her on beta-blocker due to bradycardia in the hospital.  Although I think at some point she would be able to tolerate extremely low-dose carvedilol.  I told her she can go back to work  roughly 2 weeks after discharge, she can go back to work on 09/21/2017.   Past Medical History:  Diagnosis Date  . DM type 2 (diabetes mellitus, type 2) (HCC)   . History of hiatal hernia   . Hyperlipidemia   . Hypertension   . Hypothyroidism     Past Surgical History:  Procedure Laterality Date  . ABDOMINAL HYSTERECTOMY  1995  . CORONARY STENT INTERVENTION N/A 09/07/2017   Procedure: CORONARY STENT INTERVENTION;  Surgeon: Runell Gess, MD;  Location: [facility];  Service: Cardiovascular;  Laterality: N/A;  . LEFT HEART CATH AND CORONARY ANGIOGRAPHY N/A 09/07/2017   Procedure: LEFT HEART CATH AND CORONARY ANGIOGRAPHY;  Surgeon: Runell Gess, MD;  Location: [facility];  Service: Cardiovascular;  Laterality: N/A;  . STAPEDECTOMY Right ~ 2002    Current Medications: Outpatient Medications Prior to Visit  Medication Sig Dispense Refill  . aspirin EC 81 MG EC tablet Take 1 tablet (81 mg total) by mouth daily.    [name] atorvastatin (LIPITOR) 80 MG tablet Take 1 tablet (80 mg total) by mouth daily at 6 PM. 90 tablet 3  . empagliflozin (JARDIANCE) 10 MG TABS tablet Take 10mg  daily before breakfast, drink plenty of water. 30 tablet 2  . gabapentin (NEURONTIN) 100 MG capsule Take 1 capsule (100 mg total) by mouth at bedtime. 90 capsule 3  . levothyroxine (SYNTHROID, LEVOTHROID) 50 MCG tablet Take 1 tablet (50 mcg  total) by mouth daily. 90 tablet 3  . metFORMIN (GLUCOPHAGE-XR) 500 MG 24 hr tablet TAKE 1 TABLET BY MOUTH TWICE DAILY WITH MEALS AS DIRECTED 180 tablet 3  . nitroGLYCERIN (NITROSTAT) 0.4 MG SL tablet Place 1 tablet (0.4 mg total) under the tongue every 5 (five) minutes x 3 doses as needed for chest pain. 25 tablet 3  . ticagrelor (BRILINTA) 90 MG TABS tablet Take 1 tablet (90 mg total) by mouth 2 (two) times daily. 180 tablet 3  . meloxicam (MOBIC) 15 MG tablet Take 1 tablet (15 mg total) by mouth daily. (Patient not taking: Reported on 09/12/2017) 30 tablet 3  .  methylPREDNISolone (MEDROL DOSEPAK) 4 MG TBPK tablet 6 day dose pack - take as directed (Patient not taking: Reported on 09/12/2017) 21 tablet 0   No facility-administered medications prior to visit.      Allergies:   Aspirin and Codeine   Social History   Socioeconomic History  . Marital status: Married    Spouse name: None  . Number of children: None  . Years of education: 3912  . Highest education level: None  Social Needs  . Financial resource strain: None  . Food insecurity - worry: None  . Food insecurity - inability: None  . Transportation needs - medical: None  . Transportation needs - non-medical: None  Occupational History  . Occupation: property asst. Event organisermanager    Employer: [employer]  Tobacco Use  . Smoking status: Former Smoker    Packs/day: 0.50    Years: 27.00    Pack years: 13.50    Types: Cigarettes    Last attempt to quit: 09/05/2017    Years since quitting: 0.0  . Smokeless tobacco: Never Used  Substance and Sexual Activity  . Alcohol use: No  . Drug use: No  . Sexual activity: Yes    Birth control/protection: None  Other Topics Concern  . None  Social History Narrative   Exercise: walking, 4 times/week for 30 minutes.     Family History:  The patient's family history includes Diabetes in her mother and sister; Heart disease in her brother, mother, and sister; Hyperlipidemia in her father; Stroke in her father.   ROS:   Please see the history of present illness.    ROS All other systems reviewed and are negative.   PHYSICAL EXAM:   VS:  BP 120/78   Pulse 62   Ht 5' 3.5" (1.613 m)   Wt 115 lb 3.2 oz (52.3 kg)   BMI 20.09 kg/m    GEN: Well nourished, well developed, in no acute distress  HEENT: normal  Neck: no JVD, carotid bruits, or masses Cardiac: RRR; no murmurs, rubs, or gallops,no edema  Respiratory:  clear to auscultation bilaterally, normal work of breathing GI: soft, nontender, nondistended, + BS MS: no deformity or  atrophy  Skin: warm and dry, no rash Neuro:  Alert and Oriented x 3, Strength and sensation are intact Psych: euthymic mood, full affect  Wt Readings from Last 3 Encounters:  09/14/17 115 lb 3.2 oz (52.3 kg)  09/12/17 116 lb (52.6 kg)  09/08/17 120 lb 2.4 oz (54.5 kg)      Studies/Labs Reviewed:   EKG:  EKG is ordered today.  The ekg ordered today demonstrates normal sinus rhythm without significant ischemic changes.  Recent Labs: 01/02/2017: ALT 12 09/05/2017: TSH 1.473 09/08/2017: BUN 12; Creatinine, Ser 0.66; Hemoglobin 13.6; Platelets 264; Potassium 4.4; Sodium 139   Lipid Panel  Component Value Date/Time   CHOL 259 (H) 09/05/2017 0259   CHOL 168 01/02/2017 0842   TRIG 109 09/05/2017 0259   HDL 46 09/05/2017 0259   HDL 43 01/02/2017 0842   CHOLHDL 5.6 09/05/2017 0259   VLDL 22 09/05/2017 0259   LDLCALC 191 (H) 09/05/2017 0259   LDLCALC 98 01/02/2017 0842    Additional studies/ records that were reviewed today include:    Myoview 09/05/2017  Blood pressure demonstrated a hypertensive response to exercise.  Nuclear stress EF: 71%.  Upsloping ST segment depression ST segment depression of 2 mm was noted during stress in the II, III, aVF, V4, V5 and V6 leads, beginning at 1 minutes of stress, ending at 2 minutes of stress, and returning to baseline after 1-5 minutes of recovery. After returning ST to baseline, she had TWI inferior laterally  Defect 1: There is a medium defect of moderate severity present in the mid anteroseptal, apical anterior and apical septal location.  Findings consistent with prior myocardial infarction with peri-infarct ischemia.  This is an intermediate risk study. Extreme fatigue and poor exercise tolerance   Echo 09/06/2017 LV EF: 60% -   65%  Study Conclusions  - Left ventricle: The cavity size was normal. Wall thickness was   normal. Systolic function was normal. The estimated ejection   fraction was in the range of 60% to 65%.  Wall motion was normal;   there were no regional wall motion abnormalities. Left   ventricular diastolic function parameters were normal. - Mitral valve: There was mild regurgitation. - Tricuspid valve: There was trivial regurgitation   Cath 09/07/2017 Conclusion     Prox LAD to Mid LAD lesion is 90% stenosed.  A stent was successfully placed.  Post intervention, there is a 0% residual stenosis.     ASSESSMENT:    1. Coronary artery disease involving native coronary artery of native heart without angina pectoris   2. Hyperlipidemia, unspecified hyperlipidemia type   3. Essential hypertension   4. Controlled type 2 diabetes mellitus without complication, without long-term current use of insulin (HCC)   5. Hypothyroidism, unspecified type   6. Former tobacco use      PLAN:  In order of problems listed above:  1. CAD: Denies any recent chest pain since discharge.  Compliant with aspirin and Brilinta.  Emphasis has been placed on compliance with dual antiplatelet therapy.  She and her husband has been eating healthier and has both quit smoking after the recent MI.  Continue with high-dose statin  2. Hypertension: There is questionable history of hypertension, she used to be on 5 mg daily of lisinopril.  I will restart on this medication.  I did not start her on beta-blocker despite the recent MI due to bradycardia in the hospital.  3. Hyperlipidemia: Continue high-dose statin, 6-8 weeks fasting lipid panel and LFT  4. DM 2: Monitor by primary care provider  5. Hypothyroidism: On Synthroid.  Managed by primary care provider.  6. Former tobacco use: Both her husband and her have quit smoking since recent admission.    Medication Adjustments/Labs and Tests Ordered: Current medicines are reviewed at length with the patient today.  Concerns regarding medicines are outlined above.  Medication changes, Labs and Tests ordered today are listed in the Patient Instructions  below. Patient Instructions  Medication Instructions: RESTART Lisinopril 5 mg tablet daily  If you need a refill on your cardiac medications before your next appointment, please call your pharmacy.   Labwork: Your  provider would like for you to return in 6 weeks to have the following labs drawn: FASTING lipid and liver labs. You do not need an appointment for the lab. Once in our office lobby there is a podium where you can sign in and ring the doorbell to alert [place] that you are here. The lab is open from 8:00 am to 4:30 pm; closed for lunch from 12:45pm-1:45pm.   Follow-Up: Your physician wants you to follow-up in: 2-3 months with Dr. Tresa Endo.   Special Instructions: It is ok to start cardiac rehab   Thank you for choosing [facility] at [facility]!!         Signed, Azalee Course, [place]  09/14/2017 12:01 PM    [facility] [address] [place], [place], [place]  [address] Phone: [phone]; Fax: [phone]

## 2017-09-14 NOTE — Patient Instructions (Addendum)
Medication Instructions: RESTART Lisinopril 5 mg tablet daily  If you need a refill on your cardiac medications before your next appointment, please call your pharmacy.   Labwork: Your provider would like for you to return in 6 weeks to have the following labs drawn: FASTING lipid and liver labs. You do not need an appointment for the lab. Once in our office lobby there is a podium where you can sign in and ring the doorbell to alert us that you are here. The lab is open from 8:00 am to 4:30 pm; closed for lunch from 12:45pm-1:45pm.   Follow-Up: Your physician wants you to follow-up in: 2-3 months with Dr. Tresa EndoKelly.   Special Instructions: It is ok to start cardiac rehab   Thank you for choosing [facility] at [facility]!!

## 2017-09-16 ENCOUNTER — Telehealth (HOSPITAL_COMMUNITY): Payer: Self-pay

## 2017-09-16 NOTE — Telephone Encounter (Signed)
Patients insurance is active and benefits verified through [facility] - No co-pay, deductible amount of $2,000/$2,000 has been met, out of pocket amount of $5,000/$2,845.68 has been met, 20% co-insurance, and no pre-authorization is required. Spoke with [employer] - reference [id number]  Will contact patient to see if interested in the program. If patient is interested, patient will need to complete follow up appt which has been done. Will contact patient for scheduling upon review by the RN Navigator.

## 2017-09-16 NOTE — Telephone Encounter (Signed)
Patient called to get information about CR - Patient is interested in the program. Explained to patient that our RN Navigator is looking over her paperwork. Once handed back to me, will call back to schedule. Went over insurance with patient and patient stated she understands what she is responsible for.

## 2017-09-17 ENCOUNTER — Telehealth: Payer: Self-pay | Admitting: Cardiovascular Disease

## 2017-09-17 NOTE — Telephone Encounter (Signed)
Returned call to patient, patient reports she has letter to return to work next Monday 3/11.  States her husband is very protective of her and he is not comfortable with her returning to work this soon.   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   States she thinks she knows when she needs to slow down but her husband would like to get Dr. Landry DykeKelly's opinion about this.   She states her job is stressful and he doesn't want her to get overwhelmed.   Advised patient that Dr. Tresa EndoKelly is out of office until next week.  Advised I could speak to PA in regards and see if he had any additional recommendations.  Patient denies and  request this be sent to Dr. Tresa EndoKelly to review to see if he agrees with returning to work this soon.    Routed to MD to review, patient is aware he is OOO and may not get a chance to review until then.

## 2017-09-17 NOTE — Telephone Encounter (Signed)
New Message:    Please call,question about the note that Carmen Anderson CourseHao Meng wrote.concerning her retuning to work.

## 2017-09-18 ENCOUNTER — Telehealth (HOSPITAL_COMMUNITY): Payer: Self-pay

## 2017-09-18 NOTE — Telephone Encounter (Signed)
Patient returned phone call in regards to Cardiac Rehab - Scheduled orientation on 11/12/2017 at 7:30am. Patient will attend the 6:45am exc class. Mailed packet.

## 2017-09-18 NOTE — Telephone Encounter (Signed)
Attempted to call patient in regards to Cardiac Rehab - lm on vm °

## 2017-09-21 NOTE — Telephone Encounter (Signed)
The patient underwent stenting of Carmen Anderson on September 07, 2017.  Probably okay to resume work on March 11 but may want to limit hours for the first week to make certain she tolerates this and gradually increase activity as she feels comfortable.

## 2017-09-23 NOTE — Telephone Encounter (Signed)
Left message to call back  

## 2017-09-27 ENCOUNTER — Encounter: Payer: Self-pay | Admitting: Family Medicine

## 2017-09-29 ENCOUNTER — Encounter: Payer: Self-pay | Admitting: Podiatry

## 2017-09-29 ENCOUNTER — Ambulatory Visit: Payer: BLUE CROSS/BLUE SHIELD | Admitting: Podiatry

## 2017-09-29 ENCOUNTER — Encounter: Payer: Self-pay | Admitting: Family Medicine

## 2017-09-29 DIAGNOSIS — M722 Plantar fascial fibromatosis: Secondary | ICD-10-CM | POA: Diagnosis not present

## 2017-09-29 NOTE — Progress Notes (Signed)
Presents today for follow-up of the right.  She goes on to state that she had a mild heart attack in February but completed the prednisone but is no longer on meloxicam.  Objective: Vital signs are stable alert and oriented x3.  In the role of the right heel is still present.  Assessment: Plantar fasciitis.  Plan: Injected the area today with 20 mg Kenalog was marked and a sterile Betadine skin prep medial aspect of the right heel.  Tolerated procedure well.

## 2017-10-07 ENCOUNTER — Other Ambulatory Visit: Payer: Self-pay

## 2017-10-07 ENCOUNTER — Ambulatory Visit: Payer: BLUE CROSS/BLUE SHIELD | Admitting: Family Medicine

## 2017-10-07 ENCOUNTER — Encounter: Payer: Self-pay | Admitting: Family Medicine

## 2017-10-07 VITALS — BP 105/65 | HR 72 | Temp 98.2°F | Resp 17 | Ht 63.5 in | Wt 111.4 lb

## 2017-10-07 DIAGNOSIS — E7800 Pure hypercholesterolemia, unspecified: Secondary | ICD-10-CM | POA: Diagnosis not present

## 2017-10-07 DIAGNOSIS — Z716 Tobacco abuse counseling: Secondary | ICD-10-CM

## 2017-10-07 DIAGNOSIS — I214 Non-ST elevation (NSTEMI) myocardial infarction: Secondary | ICD-10-CM

## 2017-10-07 DIAGNOSIS — I10 Essential (primary) hypertension: Secondary | ICD-10-CM

## 2017-10-07 NOTE — Patient Instructions (Addendum)
   IF you received an x-ray today, you will receive an invoice from [facility]. Please contact [facility] at [phone] with questions or concerns regarding your invoice.   IF you received labwork today, you will receive an invoice from [facility]. Please contact [facility] at [phone] with questions or concerns regarding your invoice.   Our billing staff will not be able to assist you with questions regarding bills from these companies.  You will be contacted with the lab results as soon as they are available. The fastest way to get your results is to activate your My Chart account. Instructions are located on the last page of this paperwork. If you have not heard from us regarding the results in 2 weeks, please contact this office.     Smoking Tobacco Information Smoking tobacco will very likely harm your health. Tobacco contains a poisonous (toxic), colorless chemical called nicotine. Nicotine affects the brain and makes tobacco addictive. This change in your brain can make it hard to stop smoking. Tobacco also has other toxic chemicals that can hurt your body and raise your risk of many cancers. How can smoking tobacco affect me? Smoking tobacco can increase your chances of having serious health conditions, such as:  Cancer. Smoking is most commonly associated with lung cancer, but can lead to cancer in other parts of the body.  Chronic obstructive pulmonary disease (COPD). This is a long-term lung condition that makes it hard to breathe. It also gets worse over time.  High blood pressure (hypertension), heart disease, stroke, or heart attack.  Lung infections, such as pneumonia.  Cataracts. This is when the lenses in the eyes become clouded.  Digestive problems. This may include peptic ulcers, heartburn, and gastroesophageal reflux disease (GERD).  Oral health problems, such as gum disease and tooth loss.  Loss of taste and smell.  Smoking can affect  your appearance by causing:  Wrinkles.  Yellow or stained teeth, fingers, and fingernails.  Smoking tobacco can also affect your social life.  Many workplaces, restaurants, hotels, and public places are tobacco-free. This means that you may experience challenges in finding places to smoke when away from home.  The cost of a smoking habit can be expensive. Expenses for someone who smokes come in two ways: ? You spend money on a regular basis to buy tobacco. ? Your health care costs in the long-term are higher if you smoke.  Tobacco smoke can also affect the health of those around you. Children of smokers have greater chances of: ? Sudden infant death syndrome (SIDS). ? Ear infections. ? Lung infections.  What lifestyle changes can be made?  Do not start smoking. Quit if you already do.  To quit smoking: ? Make a plan to quit smoking and commit yourself to it. Look for programs to help you and ask your health care provider for recommendations and ideas. ? Talk with your health care provider about using nicotine replacement medicines to help you quit. Medicine replacement medicines include gum, lozenges, patches, sprays, or pills. ? Do not replace cigarette smoking with electronic cigarettes, which are commonly called e-cigarettes. The safety of e-cigarettes is not known, and some may contain harmful chemicals. ? Avoid places, people, or situations that tempt you to smoke. ? If you try to quit but return to smoking, don't give up hope. It is very common for people to try a number of times before they fully succeed. When you feel ready again, give it another try.  Quitting smoking   might affect the way you eat as well as your weight. Be prepared to monitor your eating habits. Get support in planning and following a healthy diet.  Ask your health care provider about having regular tests (screenings) to check for cancer. This may include blood tests, imaging tests, and other  tests.  Exercise regularly. Consider taking walks, joining a gym, or doing yoga or exercise classes.  Develop skills to manage your stress. These skills include meditation. What are the benefits of quitting smoking? By quitting smoking, you may:  Lower your risk of getting cancer and other diseases caused by smoking.  Live longer.  Breathe better.  Lower your blood pressure and heart rate.  Stop your addiction to tobacco.  Stop creating secondhand smoke that hurts other people.  Improve your sense of taste and smell.  Look better over time, due to having fewer wrinkles and less staining.  What can happen if changes are not made? If you do not stop smoking, you may:  Get cancer and other diseases.  Develop COPD or other long-term (chronic) lung conditions.  Develop serious problems with your heart and blood vessels (cardiovascular system).  Need more tests to screen for problems caused by smoking.  Have higher, long-term healthcare costs from medicines or treatments related to smoking.  Continue to have worsening changes in your lungs, mouth, and nose.  Where to find support: To get support to quit smoking, consider:  Asking your health care provider for more information and resources.  Taking classes to learn more about quitting smoking.  Looking for local organizations that offer resources about quitting smoking.  Joining a support group for people who want to quit smoking in your local community.  Where to find more information: You may find more information about quitting smoking from:  HelpGuide.org: www.helpguide.org/articles/addictions/how-to-quit-smoking.htm  Smokefree.gov: smokefree.gov  American Lung Association: www.lung.org  Contact a health care provider if:  You have problems breathing.  Your lips, nose, or fingers turn blue.  You have chest pain.  You are coughing up blood.  You feel faint or you pass out.  You have other noticeable  changes that cause you to worry. Summary  Smoking tobacco can negatively affect your health, the health of those around you, your finances, and your social life.  Do not start smoking. Quit if you already do. If you need help quitting, ask your health care provider.  Think about joining a support group for people who want to quit smoking in your local community. There are many effective programs that will help you to quit this behavior. This information is not intended to replace advice given to you by your health care provider. Make sure you discuss any questions you have with your health care provider. Document Released: 07/15/2016 Document Revised: 07/15/2016 Document Reviewed: 07/15/2016 Elsevier Interactive Patient Education  2018 [employer]vier [employer].  

## 2017-10-07 NOTE — Progress Notes (Signed)
Chief Complaint  Patient presents with  . myocardial infarction, htn and hyperlipidemia    follow up    HPI   S/P MI Pt reports that she was cleared to return to work 09/21/2017 She reports that she is trying to manage her stress at work Her cardiac rehab is May 2019 She is walking when it is warm outside for about 15-20 minutes She has some fatigue but is improving She is doing low sodium diet  Abstinence from Smoking She reports that she is not smoking and is doing the 7mg  patches She denies mood swings, irritability or insomnia She is getting to bed earlier in the night  Wt Readings from Last 3 Encounters:  10/07/17 111 lb 6.4 oz (50.5 kg)  09/14/17 115 lb 3.2 oz (52.3 kg)  09/12/17 116 lb (52.6 kg)   Hyperlipidemia She is on the lipitor and her dose is 80mg  She has muscle cramps especially at night She states that she takes it after dinner and drinks plenty of water Lab Results  Component Value Date   CHOL 259 (H) 09/05/2017   HDL 46 09/05/2017   LDLCALC 191 (H) 09/05/2017   TRIG 109 09/05/2017   CHOLHDL 5.6 09/05/2017   Her Cardiologist will recheck her labs 10/2017  Hypertension: Patient here for follow-up of elevated blood pressure. She is exercising and is adherent to low salt diet.  Blood pressure is well controlled at home. Cardiac symptoms none. Patient denies chest pain, chest pressure/discomfort, dyspnea, exertional chest pressure/discomfort, irregular heart beat, near-syncope and palpitations.  History of target organ damage: angina/ prior myocardial infarction. BP Readings from Last 3 Encounters:  10/07/17 105/65  09/14/17 120/78  09/12/17 118/68     Past Medical History:  Diagnosis Date  . DM type 2 (diabetes mellitus, type 2) (HCC)   . History of hiatal hernia   . Hyperlipidemia   . Hypertension   . Hypothyroidism     Current Outpatient Medications  Medication Sig Dispense Refill  . aspirin EC 81 MG EC tablet Take 1 tablet (81 mg total) by  mouth daily.    [name]. atorvastatin (LIPITOR) 80 MG tablet Take 1 tablet (80 mg total) by mouth daily at 6 PM. 90 tablet 3  . empagliflozin (JARDIANCE) 10 MG TABS tablet Take 10mg  daily before breakfast, drink plenty of water. 30 tablet 2  . gabapentin (NEURONTIN) 100 MG capsule Take 1 capsule (100 mg total) by mouth at bedtime. 90 capsule 3  . levothyroxine (SYNTHROID, LEVOTHROID) 50 MCG tablet Take 1 tablet (50 mcg total) by mouth daily. 90 tablet 3  . lisinopril (PRINIVIL,ZESTRIL) 5 MG tablet Take 1 tablet (5 mg total) by mouth daily. 90 tablet 3  . metFORMIN (GLUCOPHAGE-XR) 500 MG 24 hr tablet TAKE 1 TABLET BY MOUTH TWICE DAILY WITH MEALS AS DIRECTED 180 tablet 3  . nitroGLYCERIN (NITROSTAT) 0.4 MG SL tablet Place 1 tablet (0.4 mg total) under the tongue every 5 (five) minutes x 3 doses as needed for chest pain. 25 tablet 3  . ticagrelor (BRILINTA) 90 MG TABS tablet Take 1 tablet (90 mg total) by mouth 2 (two) times daily. 180 tablet 3   No current facility-administered medications for this visit.     Allergies:  Allergies  Allergen Reactions  . Aspirin Other (See Comments)    Burns stomach  . Codeine Rash    Past Surgical History:  Procedure Laterality Date  . ABDOMINAL HYSTERECTOMY  1995  . CORONARY STENT INTERVENTION N/A 09/07/2017   Procedure: CORONARY  STENT INTERVENTION;  Surgeon: Runell Gess, MD;  Location: [facility] INVASIVE CV LAB;  Service: Cardiovascular;  Laterality: N/A;  . LEFT HEART CATH AND CORONARY ANGIOGRAPHY N/A 09/07/2017   Procedure: LEFT HEART CATH AND CORONARY ANGIOGRAPHY;  Surgeon: Runell Gess, MD;  Location: [facility] INVASIVE CV LAB;  Service: Cardiovascular;  Laterality: N/A;  . STAPEDECTOMY Right ~ 2002    Social History   Socioeconomic History  . Marital status: Married    Spouse name: Not on file  . Number of children: Not on file  . Years of education: 73  . Highest education level: Not on file  Occupational History  . Occupation: property asst.  Event organiser: [employer]  Social Needs  . Financial resource strain: Not on file  . Food insecurity:    Worry: Not on file    Inability: Not on file  . Transportation needs:    Medical: Not on file    Non-medical: Not on file  Tobacco Use  . Smoking status: Former Smoker    Packs/day: 0.50    Years: 27.00    Pack years: 13.50    Types: Cigarettes    Last attempt to quit: 09/05/2017    Years since quitting: 0.0  . Smokeless tobacco: Never Used  Substance and Sexual Activity  . Alcohol use: No  . Drug use: No  . Sexual activity: Yes    Birth control/protection: None  Lifestyle  . Physical activity:    Days per week: Not on file    Minutes per session: Not on file  . Stress: Not on file  Relationships  . Social connections:    Talks on phone: Not on file    Gets together: Not on file    Attends religious service: Not on file    Active member of club or organization: Not on file    Attends meetings of clubs or organizations: Not on file    Relationship status: Not on file  Other Topics Concern  . Not on file  Social History Narrative   Exercise: walking, 4 times/week for 30 minutes.    Family History  Problem Relation Age of Onset  . Diabetes Mother   . Heart disease Mother   . Stroke Father        2011  . Hyperlipidemia Father   . Diabetes Sister   . Heart disease Sister   . Heart disease Brother        congenital     ROS Review of Systems See HPI Constitution: No fevers or chills No malaise No diaphoresis Skin: No rash or itching Eyes: no blurry vision, no double vision GU: no dysuria or hematuria Neuro: no dizziness or headaches all others reviewed and negative   Objective: Vitals:   10/07/17 1140  BP: 105/65  Pulse: 72  Resp: 17  Temp: 98.2 F (36.8 C)  TempSrc: Oral  SpO2: 97%  Weight: 111 lb 6.4 oz (50.5 kg)  Height: 5' 3.5" (1.613 m)    Physical Exam  Constitutional: She is oriented to person, place, and  time. She appears well-developed and well-nourished.  HENT:  Head: Normocephalic and atraumatic.  Eyes: Conjunctivae and EOM are normal.  Cardiovascular: Normal rate, regular rhythm and normal heart sounds.  No murmur heard. Pulmonary/Chest: Effort normal and breath sounds normal. No respiratory distress. She has no wheezes.  Neurological: She is alert and oriented to person, place, and time.  Skin: Skin is warm. Capillary refill takes less  than 2 seconds.  Psychiatric: She has a normal mood and affect. Her behavior is normal. Judgment and thought content normal.    Assessment and Plan Carmen Anderson was seen today for myocardial infarction, htn and hyperlipidemia.  Diagnoses and all orders for this visit:  Non-ST elevation (NSTEMI) myocardial infarction (HCC)- continue heart healthy diet and exercise plan  Essential hypertension- bp tightly controlled, continue bp med  If bp gets below 100/60 will discontinue  Lisinopril 5mg  but for now benefits outweigh risk   Encounter for smoking cessation counseling-  Discussed ways to prevent relapse Continue patch  Hypercholesteremia- will await results from Cardiology Continue high dose statin     Carmen Anderson A Anaisabel Pederson

## 2017-10-20 DIAGNOSIS — I10 Essential (primary) hypertension: Secondary | ICD-10-CM | POA: Diagnosis not present

## 2017-10-20 DIAGNOSIS — E785 Hyperlipidemia, unspecified: Secondary | ICD-10-CM | POA: Diagnosis not present

## 2017-10-20 LAB — HEPATIC FUNCTION PANEL
ALT: 26 IU/L (ref 0–32)
AST: 19 IU/L (ref 0–40)
Albumin: 5 g/dL (ref 3.5–5.5)
Alkaline Phosphatase: 69 IU/L (ref 39–117)
Bilirubin Total: 0.6 mg/dL (ref 0.0–1.2)
Bilirubin, Direct: 0.14 mg/dL (ref 0.00–0.40)
Total Protein: 7.1 g/dL (ref 6.0–8.5)

## 2017-10-20 LAB — LIPID PANEL
Chol/HDL Ratio: 2.6 ratio (ref 0.0–4.4)
Cholesterol, Total: 110 mg/dL (ref 100–199)
HDL: 43 mg/dL (ref >39)
LDL Calculated: 52 mg/dL (ref 0–99)
Triglycerides: 74 mg/dL (ref 0–149)
VLDL Cholesterol Cal: 15 mg/dL (ref 5–40)

## 2017-10-27 ENCOUNTER — Ambulatory Visit: Payer: BLUE CROSS/BLUE SHIELD | Admitting: Podiatry

## 2017-10-27 DIAGNOSIS — M722 Plantar fascial fibromatosis: Secondary | ICD-10-CM

## 2017-10-28 NOTE — Progress Notes (Signed)
Presents today for follow-up of plantar fasciitis to the right foot.  States that she is doing 100% better.  Objective: Vital signs are stable she is alert and oriented x3.  Pulses are palpable.  She has no pain on palpation medial calcaneal tubercle of the right foot.  No open lesions or wounds are noted.  Assessment: Well-healing plantar fasciitis 100%.  Plan: Continue all conservative therapies and will follow up with her on an as-needed basis.

## 2017-11-11 ENCOUNTER — Telehealth (HOSPITAL_COMMUNITY): Payer: Self-pay

## 2017-11-12 ENCOUNTER — Encounter (HOSPITAL_COMMUNITY): Payer: Self-pay

## 2017-11-12 ENCOUNTER — Encounter (HOSPITAL_COMMUNITY)
Admission: RE | Admit: 2017-11-12 | Discharge: 2017-11-12 | Disposition: A | Discharge status: Home / Self Care | Payer: BLUE CROSS/BLUE SHIELD | Source: Ambulatory Visit | Attending: Cardiology | Admitting: Cardiology

## 2017-11-12 VITALS — Ht 64.25 in | Wt 111.3 lb

## 2017-11-12 DIAGNOSIS — I214 Non-ST elevation (NSTEMI) myocardial infarction: Secondary | ICD-10-CM | POA: Diagnosis not present

## 2017-11-12 DIAGNOSIS — Z48812 Encounter for surgical aftercare following surgery on the circulatory system: Secondary | ICD-10-CM | POA: Diagnosis not present

## 2017-11-12 DIAGNOSIS — Z955 Presence of coronary angioplasty implant and graft: Secondary | ICD-10-CM | POA: Diagnosis not present

## 2017-11-12 LAB — GLUCOSE, CAPILLARY: Glucose-Capillary: 196 mg/dL — ABNORMAL HIGH (ref 65–99)

## 2017-11-12 NOTE — Addendum Note (Signed)
Encounter addended by: Warrick Parisian D on: 11/12/2017 8:45 AM  Actions taken: Sign clinical note

## 2017-11-12 NOTE — Addendum Note (Signed)
Encounter addended by: Warrick Parisian D on: 11/12/2017 8:32 AM  Actions taken: Episode edited, Visit diagnoses modified, Visit Navigator Flowsheet section accepted

## 2017-11-12 NOTE — Progress Notes (Signed)
Cardiac Individual Treatment Plan  Patient Details  Name: Carmen Anderson MRN: [id number] Date of Birth: [birth year] Referring Provider:     CARDIAC REHAB PHASE II ORIENTATION from 11/12/2017 in [facility] CARDIAC REHAB  Referring Provider  Yates Decamp MD      Initial Encounter Date:    CARDIAC REHAB PHASE II ORIENTATION from 11/12/2017 in [facility] CARDIAC REHAB  Date  11/12/17  Referring Provider  Yates Decamp MD      Visit Diagnosis: NSTEMI (non-ST elevated myocardial infarction) [facility])  Status post coronary artery stent placement  Patient's Home Medications on Admission:  Current Outpatient Medications:  .  aspirin EC 81 MG EC tablet, Take 1 tablet (81 mg total) by mouth daily., Disp: , Rfl:  .  atorvastatin (LIPITOR) 80 MG tablet, Take 1 tablet (80 mg total) by mouth daily at 6 PM., Disp: 90 tablet, Rfl: 3 .  empagliflozin (JARDIANCE) 10 MG TABS tablet, Take 10mg  daily before breakfast, drink plenty of water., Disp: 30 tablet, Rfl: 2 .  gabapentin (NEURONTIN) 100 MG capsule, Take 1 capsule (100 mg total) by mouth at bedtime., Disp: 90 capsule, Rfl: 3 .  levothyroxine (SYNTHROID, LEVOTHROID) 50 MCG tablet, Take 1 tablet (50 mcg total) by mouth daily., Disp: 90 tablet, Rfl: 3 .  lisinopril (PRINIVIL,ZESTRIL) 5 MG tablet, Take 1 tablet (5 mg total) by mouth daily., Disp: 90 tablet, Rfl: 3 .  metFORMIN (GLUCOPHAGE-XR) 500 MG 24 hr tablet, TAKE 1 TABLET BY MOUTH TWICE DAILY WITH MEALS AS DIRECTED, Disp: 180 tablet, Rfl: 3 .  nitroGLYCERIN (NITROSTAT) 0.4 MG SL tablet, Place 1 tablet (0.4 mg total) under the tongue every 5 (five) minutes x 3 doses as needed for chest pain., Disp: 25 tablet, Rfl: 3 .  ticagrelor (BRILINTA) 90 MG TABS tablet, Take 1 tablet (90 mg total) by mouth 2 (two) times daily., Disp: 180 tablet, Rfl: 3  Past Medical History: Past Medical History:  Diagnosis Date  . DM type 2 (diabetes mellitus, type 2) (HCC)   . History of hiatal  hernia   . Hyperlipidemia   . Hypertension   . Hypothyroidism     Tobacco Use: Social History   Tobacco Use  Smoking Status Former Smoker  . Packs/day: 0.50  . Years: 27.00  . Pack years: 13.50  . Types: Cigarettes  . Last attempt to quit: 09/05/2017  . Years since quitting: 0.1  Smokeless Tobacco Never Used    Labs: Recent Review Flowsheet Data    Labs for ITP Cardiac and Pulmonary Rehab Latest Ref Rng & Units 04/13/2017 08/14/2017 09/05/2017 09/06/2017 10/20/2017   Cholestrol 100 - 199 mg/dL - - 409(W) - 119   LDLCALC 0 - 99 mg/dL - - 147(W) - 52   HDL >29 mg/dL - - 46 - 43   Trlycerides 0 - 149 mg/dL - - 562 - 74   Hemoglobin A1c 4.8 - 5.6 % 6.8 7 - 6.9(H) -      Capillary Blood Glucose: Lab Results  Component Value Date   GLUCAP 196 (H) 11/12/2017   GLUCAP 93 09/08/2017   GLUCAP 130 (H) 09/08/2017   GLUCAP 142 (H) 09/07/2017   GLUCAP 129 (H) 09/07/2017     Exercise Target Goals: Date: 11/12/17  Exercise Program Goal: Individual exercise prescription set using results from initial 6 min walk test and THRR while considering  patient's activity barriers and safety.   Exercise Prescription Goal: Initial exercise prescription builds to 30-45 minutes a day of  aerobic activity, 2-3 days per week.  Home exercise guidelines will be given to patient during program as part of exercise prescription that the participant will acknowledge.  Activity Barriers & Risk Stratification: Activity Barriers & Cardiac Risk Stratification - 11/12/17 0817      Activity Barriers & Cardiac Risk Stratification   Activity Barriers  Deconditioning;Shortness of Breath;Muscular Weakness;Other (comment)    Comments  B neuropathy    Cardiac Risk Stratification  High       6 Minute Walk: 6 Minute Walk    Row Name 11/12/17 1107         6 Minute Walk   Phase  Initial     Distance  1633 feet     Walk Time  6 minutes     # of Rest Breaks  0     MPH  3.1     METS  4.5     RPE  11     VO2  Peak  15.9     Symptoms  No     Resting HR  69 bpm     Resting BP  106/56     Resting Oxygen Saturation   100 %     Exercise Oxygen Saturation  during 6 min walk  100 %     Max Ex. HR  95 bpm     Max Ex. BP  124/60     2 Minute Post BP  112/60        Oxygen Initial Assessment:   Oxygen Re-Evaluation:   Oxygen Discharge (Final Oxygen Re-Evaluation):   Initial Exercise Prescription: Initial Exercise Prescription - 11/12/17 1100      Date of Initial Exercise RX and Referring Provider   Date  11/12/17    Referring Provider  Yates Decamp MD      Treadmill   MPH  2.7    Grade  0    Minutes  10    METs  3.07      Bike   Level  0.7    Minutes  10    METs  3.64      NuStep   Level  3    SPM  80    Minutes  10    METs  3      Prescription Details   Frequency (times per week)  3    Duration  Progress to 30 minutes of continuous aerobic without signs/symptoms of physical distress      Intensity   THRR 40-80% of Max Heartrate  66-132    Ratings of Perceived Exertion  11-13    Perceived Dyspnea  0-4      Progression   Progression  Continue to progress workloads to maintain intensity without signs/symptoms of physical distress.      Resistance Training   Training Prescription  Yes    Weight  3lbs    Reps  10-15       Perform Capillary Blood Glucose checks as needed.  Exercise Prescription Changes:   Exercise Comments:   Exercise Goals and Review: Exercise Goals    Row Name 11/12/17 0827             Exercise Goals   Increase Physical Activity  Yes       Intervention  Provide advice, education, support and counseling about physical activity/exercise needs.;Develop an individualized exercise prescription for aerobic and resistive training based on initial evaluation findings, risk stratification, comorbidities and participant's personal goals.  Expected Outcomes  Short Term: Attend rehab on a regular basis to increase amount of physical  activity.;Long Term: Exercising regularly at least 3-5 days a week.;Long Term: Add in home exercise to make exercise part of routine and to increase amount of physical activity.       Increase Strength and Stamina  Yes increase walking tolerance; Increase tone in mid section       Intervention  Provide advice, education, support and counseling about physical activity/exercise needs.;Develop an individualized exercise prescription for aerobic and resistive training based on initial evaluation findings, risk stratification, comorbidities and participant's personal goals.       Expected Outcomes  Long Term: Improve cardiorespiratory fitness, muscular endurance and strength as measured by increased METs and functional capacity ( );Short Term: Perform resistance training exercises routinely during rehab and add in resistance training at home;Short Term: Increase workloads from initial exercise prescription for resistance, speed, and METs.       Able to understand and use rate of perceived exertion (RPE) scale  Yes       Intervention  Provide education and explanation on how to use RPE scale       Expected Outcomes  Short Term: Able to use RPE daily in rehab to express subjective intensity level;Long Term:  Able to use RPE to guide intensity level when exercising independently       Knowledge and understanding of Target Heart Rate Range (THRR)  Yes       Intervention  Provide education and explanation of THRR including how the numbers were predicted and where they are located for reference       Expected Outcomes  Short Term: Able to state/look up THRR;Long Term: Able to use THRR to govern intensity when exercising independently;Short Term: Able to use daily as guideline for intensity in rehab       Able to check pulse independently  Yes       Intervention  Provide education and demonstration on how to check pulse in carotid and radial arteries.;Review the importance of being able to check your own pulse for  safety during independent exercise       Expected Outcomes  Short Term: Able to explain why pulse checking is important during independent exercise;Long Term: Able to check pulse independently and accurately       Understanding of Exercise Prescription  Yes       Intervention  Provide education, explanation, and written materials on patient's individual exercise prescription       Expected Outcomes  Short Term: Able to explain program exercise prescription;Long Term: Able to explain home exercise prescription to exercise independently          Exercise Goals Re-Evaluation :    Discharge Exercise Prescription (Final Exercise Prescription Changes):   Nutrition:  Target Goals: Understanding of nutrition guidelines, daily intake of sodium 1500mg , cholesterol 200mg , calories 30% from fat and 7% or less from saturated fats, daily to have 5 or more servings of fruits and vegetables.  Biometrics: Pre Biometrics - 11/12/17 1132      Pre Biometrics   % Body Fat  18.3 %        Nutrition Therapy Plan and Nutrition Goals: Nutrition Therapy & Goals - 11/12/17 0927      Nutrition Therapy   Diet  Consistent Carb, Heart Healthy      Personal Nutrition Goals   Nutrition Goal  CBG concentrations in the normal range or as close to normal as is safely possible.  Intervention Plan   Intervention  Prescribe, educate and counsel regarding individualized specific dietary modifications aiming towards targeted core components such as weight, hypertension, lipid management, diabetes, heart failure and other comorbidities.    Expected Outcomes  Short Term Goal: Understand basic principles of dietary content, such as calories, fat, sodium, cholesterol and nutrients.;Long Term Goal: Adherence to prescribed nutrition plan.       Nutrition Assessments: Nutrition Assessments - 11/12/17 0927      MEDFICTS Scores   Pre Score  6       Nutrition Goals Re-Evaluation:   Nutrition Goals  Re-Evaluation:   Nutrition Goals Discharge (Final Nutrition Goals Re-Evaluation):   Psychosocial: Target Goals: Acknowledge presence or absence of significant depression and/or stress, maximize coping skills, provide positive support system. Participant is able to verbalize types and ability to use techniques and skills needed for reducing stress and depression.  Initial Review & Psychosocial Screening: Initial Psych Review & Screening - 11/12/17 1132      Initial Review   Current issues with  Current Stress Concerns    Source of Stress Concerns  Occupation      Family Dynamics   Good Support System?  Yes Pt reports her spouse and sisters as her support system.       Barriers   Psychosocial barriers to participate in program  The patient should benefit from training in stress management and relaxation.      Screening Interventions   Interventions  Encouraged to exercise    Expected Outcomes  Long Term Goal: Stressors or current issues are controlled or eliminated. Pt will continue to exhibit a positive outlook utilizing good coping skills.        Quality of Life Scores: Quality of Life - 11/12/17 1134      Quality of Life Scores   Health/Function Pre  28 %    Socioeconomic Pre  26.57 %    Psych/Spiritual Pre  29.14 %    Family Pre  30 %    GLOBAL Pre  28.18 %      Scores of 19 and below usually indicate a poorer quality of life in these areas.  A difference of  2-3 points is a clinically meaningful difference.  A difference of 2-3 points in the total score of the Quality of Life Index has been associated with significant improvement in overall quality of life, self-image, physical symptoms, and general health in studies assessing change in quality of life.  PHQ-9: Recent Review Flowsheet Data    Depression screen [facility] 2/9 11/12/2017 10/07/2017 09/12/2017 01/02/2017 07/10/2016   Decreased Interest 0 0 0 0 0   Down, Depressed, Hopeless 0 0 0 0 0   PHQ - 2 Score 0 0 0 0 0      Interpretation of Total Score  Total Score Depression Severity:  1-4 = Minimal depression, 5-9 = Mild depression, 10-14 = Moderate depression, 15-19 = Moderately severe depression, 20-27 = Severe depression   Psychosocial Evaluation and Intervention:   Psychosocial Re-Evaluation:   Psychosocial Discharge (Final Psychosocial Re-Evaluation):   Vocational Rehabilitation: Provide vocational rehab assistance to qualifying candidates.   Vocational Rehab Evaluation & Intervention: Vocational Rehab - 11/12/17 1131      Initial Vocational Rehab Evaluation & Intervention   Assessment shows need for Vocational Rehabilitation  No       Education: Education Goals: Education classes will be provided on a weekly basis, covering required topics. Participant will state understanding/return demonstration of topics presented.  Learning Barriers/Preferences:  Learning Barriers/Preferences - 11/12/17 0827      Learning Barriers/Preferences   Learning Barriers  Sight;Hearing    Learning Preferences  Skilled Demonstration       Education Topics: Count Your Pulse:  -Group instruction provided by verbal instruction, demonstration, patient participation and written materials to support subject.  Instructors address importance of being able to find your pulse and how to count your pulse when at home without a heart monitor.  Patients get hands on experience counting their pulse with staff help and individually.   Heart Attack, Angina, and Risk Factor Modification:  -Group instruction provided by verbal instruction, video, and written materials to support subject.  Instructors address signs and symptoms of angina and heart attacks.    Also discuss risk factors for heart disease and how to make changes to improve heart health risk factors.   Functional Fitness:  -Group instruction provided by verbal instruction, demonstration, patient participation, and written materials to support subject.   Instructors address safety measures for doing things around the house.  Discuss how to get up and down off the floor, how to pick things up properly, how to safely get out of a chair without assistance, and balance training.   Meditation and Mindfulness:  -Group instruction provided by verbal instruction, patient participation, and written materials to support subject.  Instructor addresses importance of mindfulness and meditation practice to help reduce stress and improve awareness.  Instructor also leads participants through a meditation exercise.    Stretching for Flexibility and Mobility:  -Group instruction provided by verbal instruction, patient participation, and written materials to support subject.  Instructors lead participants through series of stretches that are designed to increase flexibility thus improving mobility.  These stretches are additional exercise for major muscle groups that are typically performed during regular warm up and cool down.   Hands Only CPR:  -Group verbal, video, and participation provides a basic overview of AHA guidelines for community CPR. Role-play of emergencies allow participants the opportunity to practice calling for help and chest compression technique with discussion of AED use.   Hypertension: -Group verbal and written instruction that provides a basic overview of hypertension including the most recent diagnostic guidelines, risk factor reduction with self-care instructions and medication management.    Nutrition I class: Heart Healthy Eating:  -Group instruction provided by PowerPoint slides, verbal discussion, and written materials to support subject matter. The instructor gives an explanation and review of the Therapeutic Lifestyle Changes diet recommendations, which includes a discussion on lipid goals, dietary fat, sodium, fiber, plant stanol/sterol esters, sugar, and the components of a well-balanced, healthy diet.   Nutrition II class:  Lifestyle Skills:  -Group instruction provided by PowerPoint slides, verbal discussion, and written materials to support subject matter. The instructor gives an explanation and review of label reading, grocery shopping for heart health, heart healthy recipe modifications, and ways to make healthier choices when eating out.   Diabetes Question & Answer:  -Group instruction provided by PowerPoint slides, verbal discussion, and written materials to support subject matter. The instructor gives an explanation and review of diabetes co-morbidities, pre- and post-prandial blood glucose goals, pre-exercise blood glucose goals, signs, symptoms, and treatment of hypoglycemia and hyperglycemia, and foot care basics.   Diabetes Blitz:  -Group instruction provided by PowerPoint slides, verbal discussion, and written materials to support subject matter. The instructor gives an explanation and review of the physiology behind type 1 and type 2 diabetes, diabetes medications and rational behind using different medications, pre-  and post-prandial blood glucose recommendations and Hemoglobin A1c goals, diabetes diet, and exercise including blood glucose guidelines for exercising safely.    Portion Distortion:  -Group instruction provided by PowerPoint slides, verbal discussion, written materials, and food models to support subject matter. The instructor gives an explanation of serving size versus portion size, changes in portions sizes over the last 20 years, and what consists of a serving from each food group.   Stress Management:  -Group instruction provided by verbal instruction, video, and written materials to support subject matter.  Instructors review role of stress in heart disease and how to cope with stress positively.     Exercising on Your Own:  -Group instruction provided by verbal instruction, power point, and written materials to support subject.  Instructors discuss benefits of exercise, components  of exercise, frequency and intensity of exercise, and end points for exercise.  Also discuss use of nitroglycerin and activating EMS.  Review options of places to exercise outside of rehab.  Review guidelines for sex with heart disease.   Cardiac Drugs I:  -Group instruction provided by verbal instruction and written materials to support subject.  Instructor reviews cardiac drug classes: antiplatelets, anticoagulants, beta blockers, and statins.  Instructor discusses reasons, side effects, and lifestyle considerations for each drug class.   Cardiac Drugs II:  -Group instruction provided by verbal instruction and written materials to support subject.  Instructor reviews cardiac drug classes: angiotensin converting enzyme inhibitors (ACE-I), angiotensin II receptor blockers (ARBs), nitrates, and calcium channel blockers.  Instructor discusses reasons, side effects, and lifestyle considerations for each drug class.   Anatomy and Physiology of the Circulatory System:  Group verbal and written instruction and models provide basic cardiac anatomy and physiology, with the coronary electrical and arterial systems. Review of: AMI, Angina, Valve disease, Heart Failure, Peripheral Artery Disease, Cardiac Arrhythmia, Pacemakers, and the ICD.   Other Education:  -Group or individual verbal, written, or video instructions that support the educational goals of the cardiac rehab program.   Holiday Eating Survival Tips:  -Group instruction provided by PowerPoint slides, verbal discussion, and written materials to support subject matter. The instructor gives patients tips, tricks, and techniques to help them not only survive but enjoy the holidays despite the onslaught of food that accompanies the holidays.   Knowledge Questionnaire Score: Knowledge Questionnaire Score - 11/12/17 1107      Knowledge Questionnaire Score   Pre Score  22/24       Core Components/Risk Factors/Patient Goals at  Admission: Personal Goals and Risk Factors at Admission - 11/12/17 1139      Core Components/Risk Factors/Patient Goals on Admission    Weight Management  Yes;Weight Maintenance    Intervention  Weight Management: Develop a combined nutrition and exercise program designed to reach desired caloric intake, while maintaining appropriate intake of nutrient and fiber, sodium and fats, and appropriate energy expenditure required for the weight goal.;Weight Management: Provide education and appropriate resources to help participant work on and attain dietary goals.;Weight Management/Obesity: Establish reasonable short term and long term weight goals.    Expected Outcomes  Short Term: Continue to assess and modify interventions until short term weight is achieved;Long Term: Adherence to nutrition and physical activity/exercise program aimed toward attainment of established weight goal;Weight Maintenance: Understanding of the daily nutrition guidelines, which includes 25-35% calories from fat, 7% or less cal from saturated fats, less than 200mg  cholesterol, less than 1.5gm of sodium, & 5 or more servings of fruits and vegetables daily;Weight Loss: Understanding  of general recommendations for a balanced deficit meal plan, which promotes 1-2 lb weight loss per week and includes a negative energy balance of [phone] kcal/d;Understanding recommendations for meals to include 15-35% energy as protein, 25-35% energy from fat, 35-60% energy from carbohydrates, less than 200mg  of dietary cholesterol, 20-35 gm of total fiber daily;Understanding of distribution of calorie intake throughout the day with the consumption of 4-5 meals/snacks    Diabetes  Yes    Intervention  Provide education about signs/symptoms and action to take for hypo/hyperglycemia.;Provide education about proper nutrition, including hydration, and aerobic/resistive exercise prescription along with prescribed medications to achieve blood glucose in normal  ranges: Fasting glucose 65-99 mg/dL    Expected Outcomes  Short Term: Participant verbalizes understanding of the signs/symptoms and immediate care of hyper/hypoglycemia, proper foot care and importance of medication, aerobic/resistive exercise and nutrition plan for blood glucose control.;Long Term: Attainment of HbA1C < 7%.    Hypertension  Yes    Intervention  Provide education on lifestyle modifcations including regular physical activity/exercise, weight management, moderate sodium restriction and increased consumption of fresh fruit, vegetables, and low fat dairy, alcohol moderation, and smoking cessation.;Monitor prescription use compliance.    Expected Outcomes  Short Term: Continued assessment and intervention until BP is < 140/74mm HG in hypertensive participants. < 130/58mm HG in hypertensive participants with diabetes, heart failure or chronic kidney disease.;Long Term: Maintenance of blood pressure at goal levels.    Lipids  Yes    Intervention  Provide education and support for participant on nutrition & aerobic/resistive exercise along with prescribed medications to achieve LDL 70mg , HDL >40mg .    Expected Outcomes  Short Term: Participant states understanding of desired cholesterol values and is compliant with medications prescribed. Participant is following exercise prescription and nutrition guidelines.;Long Term: Cholesterol controlled with medications as prescribed, with individualized exercise RX and with personalized nutrition plan. Value goals: LDL < 70mg , HDL > 40 mg.    Stress  Yes    Intervention  Offer individual and/or small group education and counseling on adjustment to heart disease, stress management and health-related lifestyle change. Teach and support self-help strategies.;Refer participants experiencing significant psychosocial distress to appropriate mental health specialists for further evaluation and treatment. When possible, include family members and significant  others in education/counseling sessions.    Expected Outcomes  Short Term: Participant demonstrates changes in health-related behavior, relaxation and other stress management skills, ability to obtain effective social support, and compliance with psychotropic medications if prescribed.;Long Term: Emotional wellbeing is indicated by absence of clinically significant psychosocial distress or social isolation.       Core Components/Risk Factors/Patient Goals Review:    Core Components/Risk Factors/Patient Goals at Discharge (Final Review):    ITP Comments: ITP Comments    Row Name 11/12/17 0815           ITP Comments  Dr. Armanda Magic, Medical Director          Comments: Patient attended orientation from [phone] to 0908 to review rules and guidelines for program. Completed 6 minute walk test, Intitial ITP, and exercise prescription.  VSS. Telemetry-SR.  Asymptomatic.  Tolerated well.  Pt with stress related to job. Exercise and stress management education encouraged.  Will continue to monitor pt throughout program.

## 2017-11-12 NOTE — Addendum Note (Signed)
Encounter addended by: Warrick Parisian D on: 11/12/2017 12:02 PM  Actions taken: Flowsheet accepted, Visit Navigator Flowsheet section accepted

## 2017-11-12 NOTE — Addendum Note (Signed)
Encounter addended by: Jacques Earthly, RD on: 11/12/2017 9:35 AM  Actions taken: Sign clinical note, Visit Navigator Flowsheet section accepted

## 2017-11-12 NOTE — Addendum Note (Signed)
Encounter addended by: Nikki Dom, RN on: 11/12/2017 12:09 PM  Actions taken: Vitals modified, Order Reconciliation Section accessed, Visit Navigator Flowsheet section accepted, Sign clinical note

## 2017-11-12 NOTE — Progress Notes (Signed)
CECILA SATCHER 57 y.o. female DOB: 03-09-1961 MRN: [id number]      Nutrition Note  1. NSTEMI (non-ST elevated myocardial infarction) (HCC)   2. Status post coronary artery stent placement    Past Medical History:  Diagnosis Date  . DM type 2 (diabetes mellitus, type 2) (HCC)   . History of hiatal hernia   . Hyperlipidemia   . Hypertension   . Hypothyroidism    Meds reviewed. Jardiance, Metformin XR noted  HT: Ht Readings from Last 1 Encounters:  11/12/17 5' 4.25" (1.632 m)    WT: Wt Readings from Last 5 Encounters:  11/12/17 111 lb 5.3 oz (50.5 kg)  10/07/17 111 lb 6.4 oz (50.5 kg)  09/14/17 115 lb 3.2 oz (52.3 kg)  09/12/17 116 lb (52.6 kg)  09/08/17 120 lb 2.4 oz (54.5 kg)     Body mass index is 18.96 kg/m.   Current tobacco use? No Recently quit tobacco use 09/05/17  Labs:  Lipid Panel     Component Value Date/Time   CHOL 110 10/20/2017 0818   TRIG 74 10/20/2017 0818   HDL 43 10/20/2017 0818   CHOLHDL 2.6 10/20/2017 0818   CHOLHDL 5.6 09/05/2017 0259   VLDL 22 09/05/2017 0259   LDLCALC 52 10/20/2017 0818    Lab Results  Component Value Date   HGBA1C 6.9 (H) 09/06/2017   CBG (last 3)  Recent Labs    11/12/17 0809  GLUCAP 196*    Nutrition Note Spoke with pt. Nutrition plan and goals reviewed with pt. Pt is following Step 2 of the Therapeutic Lifestyle Changes diet. Pt is diabetic. Last A1c indicates blood glucose fairly well-controlled. Pt checks CBG's 2 times a day. Pt rotates CBG checks from fasting to 2 hr post-prandial. Pt sees Dr. Wyonia Hough for DM management. Per discussion, pt is doing well managing tobacco cessation. Pt has noticed food tastes better and her sense of smell has improved. Pt has lost 9 lb (7.5% decrease) over the past 2.5 months, which pt attributes to significant dietary changes. Pt expressed understanding of the information reviewed. Pt aware of nutrition education classes offered.  Nutrition Diagnosis ? Food-and nutrition-related  knowledge deficit related to lack of exposure to information as related to diagnosis of: ? CVD ? DM  Nutrition Intervention ? Pt's individual nutrition plan and goals reviewed with pt. ? Pt given handouts for: ? Nutrition I class ? Nutrition II class ? Diabetes Blitz Class  Nutrition Goal(s):  ? CBG concentrations in the normal range or as close to normal as is safely possible.  Plan:  Pt to attend nutrition classes ? Nutrition I ? Nutrition II ? Portion Distortion ? Diabetes Blitz ? Diabetes Q & A Will provide client-centered nutrition education as part of interdisciplinary care.   Monitor and evaluate progress toward nutrition goal with team.  Mickle Plumb, M.Ed, RD, LDN, CDE 11/12/2017 9:28 AM

## 2017-11-12 NOTE — Addendum Note (Signed)
Encounter addended by: Nikki Dom, RN on: 11/12/2017 8:51 AM  Actions taken: Home Medications modified, Medication taking status modified, Order Reconciliation Section accessed, Medication List reviewed, Allergies reviewed, Visit Navigator Flowsheet section accepted

## 2017-11-12 NOTE — Progress Notes (Signed)
Cardiac Rehab Medication Review by a RN  Does the patient  feel that his/her medications are working for him/her?  yes  Has the patient been experiencing any side effects to the medications prescribed?  Yes She reports some SOB with Brilinta use and muscle cramps with Lipitor.   Does the patient measure his/her own blood pressure or blood glucose at home?  yes   Does the patient have any problems obtaining medications due to transportation or finances?   no  Understanding of regimen: excellent Understanding of indications: excellent Potential of compliance: excellent    Pharmacist comments: Pt with an excellent understanding of medications.  Reports some muscle cramps this morning that resolved with a "sip of pickle juice."  Reported that she was told by her cardiology PA of the side effect of SOB with Brilinta but that it would resolve.  Pt has an appointment with the cardiologist on Monday and plans to bring up both of these points to her provider.    Nikki Dom 11/12/2017 11:50 AM

## 2017-11-13 NOTE — Telephone Encounter (Signed)
Cardiac Rehab - Pharmacy Resident Documentation   Patient unable to be reached after three call attempts. Please complete allergy verification and medication review during patient's cardiac rehab appointment.    Diana L. Raymond, PharmD, MS PGY1 Pharmacy Resident Pager: [phone]  

## 2017-11-16 ENCOUNTER — Encounter: Payer: Self-pay | Admitting: Cardiovascular Disease

## 2017-11-16 ENCOUNTER — Ambulatory Visit: Payer: BLUE CROSS/BLUE SHIELD | Admitting: Cardiovascular Disease

## 2017-11-16 VITALS — BP 102/58 | HR 55 | Ht 63.5 in | Wt 112.8 lb

## 2017-11-16 DIAGNOSIS — I2510 Atherosclerotic heart disease of native coronary artery without angina pectoris: Secondary | ICD-10-CM

## 2017-11-16 DIAGNOSIS — I10 Essential (primary) hypertension: Secondary | ICD-10-CM | POA: Diagnosis not present

## 2017-11-16 DIAGNOSIS — E119 Type 2 diabetes mellitus without complications: Secondary | ICD-10-CM | POA: Diagnosis not present

## 2017-11-16 DIAGNOSIS — Z87891 Personal history of nicotine dependence: Secondary | ICD-10-CM | POA: Diagnosis not present

## 2017-11-16 DIAGNOSIS — E785 Hyperlipidemia, unspecified: Secondary | ICD-10-CM

## 2017-11-16 DIAGNOSIS — E039 Hypothyroidism, unspecified: Secondary | ICD-10-CM

## 2017-11-16 NOTE — Patient Instructions (Signed)

## 2017-11-16 NOTE — Progress Notes (Signed)
Cardiology Office Note    Date:  11/18/2017   ID:  SHENEIKA WALSTAD, DOB 11/04/1960, MRN [id number]  PCP:  Forrest Moron, MD  Cardiologist:  Shelva Majestic, MD   Chief Complaint  Patient presents with  . Follow-up  . Shortness of Breath   Initial post hospital follow-up evaluation with me  History of Present Illness:  Carmen Anderson is a 57 y.o. female has a history of hyperlipidemia, diabetes mellitus, hypothyroidism, who was seen in the office today following her recent non-ST segment elevation MI.  Carmen Anderson was hospitalized on September 04, 2017 after developing chest pain with left arm radiation associated with shortness of breath.  I had seen her for cardiology consultation during her evaluation.  She had smoked for approximately 10 years, quit for 5 years, and then had been smoking for another 10 years.  She ultimately ruled in for non-ST segment MI.  A nuclear study showed a medium defect of moderate severity in the mid anteroseptal apical anterior apical septal location.  An echo Doppler study showed an EF of 60 to 65%.  She underwent cardiac catheterization of fiber 25 2019 which showed a 90% proximal to mid LAD stenosis successfully treated with DES stenting by Dr. Gwenlyn Found and a 2.25 x 16 mm Synergy stent was inserted.  Subsequently, she stabilized.  Initial LDL cholesterol was notable for 191.  She has been on atorvastatin 80 mg daily for hyperlipidemia and 3 weeks ago repeat laboratory showed an LDL cholesterol improved to 52.  She is diabetic on Jardiance 10 mg in addition to metformin.  She has been on aspirin and Brilinta for dual antiplatelet therapy.  She is on lisinopril 5 mg daily.  She has hypothyroidism on levothyroxine at 50 mcg.  She is on Jardiance in addition to metformin for diabetes mellitus.  He denies recurrent chest pain symptomatology.  To start cardiac rehab on Nov 18, 2017.  She presents for reevaluation.  Past Medical History:  Diagnosis Date  . DM type 2  (diabetes mellitus, type 2) ([place])   . History of hiatal hernia   . Hyperlipidemia   . Hypertension   . Hypothyroidism     Past Surgical History:  Procedure Laterality Date  . ABDOMINAL HYSTERECTOMY  1995  . CARDIAC CATHETERIZATION    . CORONARY STENT INTERVENTION N/A 09/07/2017   Procedure: CORONARY STENT INTERVENTION;  Surgeon: Lorretta Harp, MD;  Location: [facility];  Service: Cardiovascular;  Laterality: N/A;  . LEFT HEART CATH AND CORONARY ANGIOGRAPHY N/A 09/07/2017   Procedure: LEFT HEART CATH AND CORONARY ANGIOGRAPHY;  Surgeon: Lorretta Harp, MD;  Location: [facility];  Service: Cardiovascular;  Laterality: N/A;  . STAPEDECTOMY Right ~ 2002    Current Medications: Outpatient Medications Prior to Visit  Medication Sig Dispense Refill  . aspirin EC 81 MG EC tablet Take 1 tablet (81 mg total) by mouth daily.    [name] atorvastatin (LIPITOR) 80 MG tablet Take 1 tablet (80 mg total) by mouth daily at 6 PM. 90 tablet 3  . empagliflozin (JARDIANCE) 10 MG TABS tablet Take '10mg'$  daily before breakfast, drink plenty of water. 30 tablet 2  . gabapentin (NEURONTIN) 100 MG capsule Take 1 capsule (100 mg total) by mouth at bedtime. 90 capsule 3  . levothyroxine (SYNTHROID, LEVOTHROID) 50 MCG tablet Take 1 tablet (50 mcg total) by mouth daily. 90 tablet 3  . lisinopril (PRINIVIL,ZESTRIL) 5 MG tablet Take 1 tablet (5 mg total) by  mouth daily. 90 tablet 3  . metFORMIN (GLUCOPHAGE-XR) 500 MG 24 hr tablet TAKE 1 TABLET BY MOUTH TWICE DAILY WITH MEALS AS DIRECTED 180 tablet 3  . nitroGLYCERIN (NITROSTAT) 0.4 MG SL tablet Place 1 tablet (0.4 mg total) under the tongue every 5 (five) minutes x 3 doses as needed for chest pain. 25 tablet 3  . ticagrelor (BRILINTA) 90 MG TABS tablet Take 1 tablet (90 mg total) by mouth 2 (two) times daily. 180 tablet 3   No facility-administered medications prior to visit.      Allergies:   Aspirin and Codeine   Social History   Socioeconomic  History  . Marital status: Married    Spouse name: Not on file  . Number of children: Not on file  . Years of education: 17  . Highest education level: Not on file  Occupational History  . 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 Financial resource strain: Not on file  . Food insecurity:    Worry: Not on file    Inability: Not on file  . Transportation needs:    Medical: Not on file    Non-medical: Not on file  Tobacco Use  . Smoking status: Former Smoker    Packs/day: 0.50    Years: 27.00    Pack years: 13.50    Types: Cigarettes    Last attempt to quit: 09/05/2017    Years since quitting: 0.2  . Smokeless tobacco: Never Used  Substance and Sexual Activity  . Alcohol use: No  . Drug use: No  . Sexual activity: Yes    Birth control/protection: None  Lifestyle  . Physical activity:    Days per week: Not on file    Minutes per session: Not on file  . Stress: Not on file  Relationships  . Social connections:    Talks on phone: Not on file    Gets together: Not on file    Attends religious service: Not on file    Active member of club or organization: Not on file    Attends meetings of clubs or organizations: Not on file    Relationship status: Not on file  Other Topics Concern  . Not on file  Social History Narrative   Exercise: walking, 4 times/week for 30 minutes.    Quit tobacco on September 04, 2017  Family History:  The patient's family history includes Diabetes in her mother and sister; Heart disease in her brother, mother, and sister; Hyperlipidemia in her father; Stroke in her father.   ROS General: Negative; No fevers, chills, or night sweats;  HEENT: Negative; No changes in vision or hearing, sinus congestion, difficulty swallowing Pulmonary: Negative; No cough, wheezing, shortness of breath, hemoptysis Cardiovascular: See HPI GI: Negative; No nausea, vomiting, diarrhea, or abdominal pain GU:  Negative; No dysuria, hematuria, or difficulty voiding Musculoskeletal: Negative; no myalgias, joint pain, or weakness Hematologic/Oncology: Negative; no easy bruising, bleeding Endocrine: Negative; no heat/cold intolerance; no diabetes Neuro: Negative; no changes in balance, headaches Skin: Negative; No rashes or skin lesions Psychiatric: Negative; No behavioral problems, depression Sleep: Negative; No snoring, daytime sleepiness, hypersomnolence, bruxism, restless legs, hypnogognic hallucinations, no cataplexy Other comprehensive 14 point system review is negative.   PHYSICAL EXAM:   VS:  BP (!) 102/58   Pulse (!) 55   Ht 5' 3.5" (1.613 m)   Wt 112 lb 12.8 oz (51.2 kg)   BMI 19.67 kg/m  Repeat blood pressure by me was 110/60 supine and 110/60 standing  Wt Readings from Last 3 Encounters:  11/16/17 112 lb 12.8 oz (51.2 kg)  11/12/17 111 lb 5.3 oz (50.5 kg)  10/07/17 111 lb 6.4 oz (50.5 kg)    General: Alert, oriented, no distress.  Skin: normal turgor, no rashes, warm and dry HEENT: Normocephalic, atraumatic. Pupils equal round and reactive to light; sclera anicteric; extraocular muscles intact;  Nose without nasal septal hypertrophy Mouth/Parynx benign; Mallinpatti scale 3 Neck: No JVD, no carotid bruits; normal carotid upstroke Lungs: clear to ausculatation and percussion; no wheezing or rales Chest wall: without tenderness to palpitation Heart: PMI not displaced, RRR, s1 s2 normal, 1/6 systolic murmur, no diastolic murmur, no rubs, gallops, thrills, or heaves Abdomen: soft, nontender; no hepatosplenomehaly, BS+; abdominal aorta nontender and not dilated by palpation. Back: no CVA tenderness Pulses 2+ Musculoskeletal: full range of motion, normal strength, no joint deformities Extremities: no clubbing cyanosis or edema, Homan's sign negative  Neurologic: grossly nonfocal; Cranial nerves grossly wnl Psychologic: Normal mood and affect   Studies/Labs Reviewed:   EKG:   EKG is ordered today.  ECG (independently read by me): Sinus bradycardia 55 bpm.  No ST segment changes.  No ectopy.  Recent Labs: BMP Latest Ref Rng & Units 09/08/2017 09/05/2017 09/04/2017  Glucose 65 - 99 mg/dL 139(H) 212(H) 165(H)  BUN 6 - 20 mg/dL '12 16 13  '$ Creatinine 0.44 - 1.00 mg/dL 0.66 0.82 0.76  BUN/Creat Ratio 9 - 23 - - -  Sodium 135 - 145 mmol/L 139 136 136  Potassium 3.5 - 5.1 mmol/L 4.4 5.2(H) 4.1  Chloride 101 - 111 mmol/L 110 106 103  CO2 22 - 32 mmol/L 22 21(L) 19(L)  Calcium 8.9 - 10.3 mg/dL 9.2 9.2 9.8     Hepatic Function Latest Ref Rng & Units 10/20/2017 01/02/2017 07/10/2016  Total Protein 6.0 - 8.5 g/dL 7.1 7.1 7.1  Albumin 3.5 - 5.5 g/dL 5.0 4.6 4.8  AST 0 - 40 IU/L '19 15 12  '$ ALT 0 - 32 IU/L '26 12 14  '$ Alk Phosphatase 39 - 117 IU/L 69 58 57  Total Bilirubin 0.0 - 1.2 mg/dL 0.6 0.3 0.3  Bilirubin, Direct 0.00 - 0.40 mg/dL 0.14 - -    CBC Latest Ref Rng & Units 09/08/2017 09/07/2017 09/06/2017  WBC 4.0 - 10.5 K/uL 11.9(H) 11.8(H) 12.6(H)  Hemoglobin 12.0 - 15.0 g/dL 13.6 13.5 13.0  Hematocrit 36.0 - 46.0 % 41.2 41.8 39.6  Platelets 150 - 400 K/uL 264 302 278   Lab Results  Component Value Date   MCV 92.2 09/08/2017   MCV 92.9 09/07/2017   MCV 92.1 09/06/2017   Lab Results  Component Value Date   TSH 1.473 09/05/2017   Lab Results  Component Value Date   HGBA1C 6.9 (H) 09/06/2017     BNP No results found for: BNP  ProBNP No results found for: PROBNP   Lipid Panel     Component Value Date/Time   CHOL 110 10/20/2017 0818   TRIG 74 10/20/2017 0818   HDL 43 10/20/2017 0818   CHOLHDL 2.6 10/20/2017 0818   CHOLHDL 5.6 09/05/2017 0259   VLDL 22 09/05/2017 0259   LDLCALC 52 10/20/2017 0818     RADIOLOGY: No results found.   Additional studies/ records that were reviewed today include:  I reviewed her [facility], cardiac catheterization report, echo Doppler study, subsequent office visits.    ASSESSMENT:    1. Coronary  artery disease  involving native coronary artery of native heart without angina pectoris   2. Hyperlipidemia with target LDL less than 70   3. Essential hypertension   4. Controlled type 2 diabetes mellitus without complication, without long-term current use of insulin ([place])   5. Hypothyroidism, unspecified type   6. Former tobacco use      PLAN:  Carmen Anderson is a 57 year old female who suffered a non-ST segment elevation myocardial infarction in February 2019 and ultimately was found to have high-grade proximal to mid 90% LAD stenosis which was successfully stented with a synergy DES stent.  She has a long-standing history of prior diabetes mellitus, retention, hyperlipidemia, and tobacco use.  Fortunately she quit smoking on her day of admission.  She has been without recurrent anginal symptomatology since intervention.  Her blood pressure today is stable on her current therapy consisting of lisinopril 5 mg.  She is on dual antiplatelet therapy with aspirin and Brilinta and is not having any bleeding issues.  She is on Jardiance 10 mg daily and metformin for type 2 diabetes mellitus.  Her levothyroxine 50 mcg is treating her hypothyroidism.  Lipid studies on atorvastatin 80 mg are markedly improved with an LDL cholesterol being reduced to 52 from 191.  She will initiate cardiac rehab this week.  We discussed the importance of continued exercise.  I commended her on her smoking cessation.  I will see her in 6 months for reevaluation.   Medication Adjustments/Labs and Tests Ordered: Current medicines are reviewed at length with the patient today.  Concerns regarding medicines are outlined above.  Medication changes, Labs and Tests ordered today are listed in the Patient Instructions below. Patient Instructions  Medication Instructions:  Your physician recommends that you continue on your current medications as directed. Please refer to the Current Medication list given to you  today.   Follow-Up: Your physician wants you to follow-up in: 6 months with Dr. Claiborne Billings. You will receive a reminder letter in the mail two months in advance. If you don't receive a letter, please call our office to schedule the follow-up appointment.   Any Other Special Instructions Will Be Listed Below (If Applicable).     If you need a refill on your cardiac medications before your next appointment, please call your pharmacy.      Signed, Shelva Majestic, MD  11/18/2017 6:30 PM    [facility] [address], [place], [place], [place]  [address] Phone: [phone]

## 2017-11-18 ENCOUNTER — Encounter (HOSPITAL_COMMUNITY): Payer: Self-pay

## 2017-11-18 ENCOUNTER — Encounter: Payer: Self-pay | Admitting: Cardiovascular Disease

## 2017-11-18 ENCOUNTER — Encounter (HOSPITAL_COMMUNITY)
Admission: RE | Admit: 2017-11-18 | Discharge: 2017-11-18 | Disposition: A | Discharge status: Home / Self Care | Payer: BLUE CROSS/BLUE SHIELD | Source: Ambulatory Visit | Attending: Cardiology | Admitting: Cardiology

## 2017-11-18 ENCOUNTER — Encounter (HOSPITAL_COMMUNITY): Payer: BLUE CROSS/BLUE SHIELD

## 2017-11-18 DIAGNOSIS — Z955 Presence of coronary angioplasty implant and graft: Secondary | ICD-10-CM

## 2017-11-18 DIAGNOSIS — I214 Non-ST elevation (NSTEMI) myocardial infarction: Secondary | ICD-10-CM

## 2017-11-18 DIAGNOSIS — Z48812 Encounter for surgical aftercare following surgery on the circulatory system: Secondary | ICD-10-CM | POA: Diagnosis not present

## 2017-11-18 LAB — GLUCOSE, CAPILLARY
Glucose-Capillary: 116 mg/dL — ABNORMAL HIGH (ref 65–99)
Glucose-Capillary: 115 mg/dL — ABNORMAL HIGH (ref 65–99)

## 2017-11-19 NOTE — Progress Notes (Signed)
Daily Session Note  Patient Details  Name: DIANNA EWALD MRN: [id number] Date of Birth: July 24, 1960 Referring Provider:     [place] from 11/12/2017 in [place]  Referring Provider  Adrian Prows MD      Encounter Date: 11/18/2017  Check In:   Capillary Blood Glucose: No results found for this or any previous visit (from the past 24 hour(s)).    Social History   Tobacco Use  Smoking Status Former Smoker  . Packs/day: 0.50  . Years: 27.00  . Pack years: 13.50  . Types: Cigarettes  . Last attempt to quit: 09/05/2017  . Years since quitting: 0.2  Smokeless Tobacco Never Used    Goals Met:  Exercise tolerated well  Goals Unmet:  Not Applicable  Comments: Pt started cardiac rehab today.  Pt tolerated light exercise without difficulty. VSS, telemetry-SR, asymptomatic.  Medication list reconciled. Pt denies barriers to medicaiton compliance.  PSYCHOSOCIAL ASSESSMENT:  PHQ-0. Pt exhibits positive coping skills, hopeful outlook with supportive family. No psychosocial needs identified at this time, no psychosocial interventions necessary.  Pt oriented to exercise equipment and routine.  Understanding verbalized.    Dr. Fransico Him is Medical Director for Cardiac Rehab at [facility].

## 2017-11-20 ENCOUNTER — Encounter (HOSPITAL_COMMUNITY): Payer: BLUE CROSS/BLUE SHIELD

## 2017-11-20 ENCOUNTER — Encounter (HOSPITAL_COMMUNITY)
Admission: RE | Admit: 2017-11-20 | Discharge: 2017-11-20 | Disposition: A | Discharge status: Home / Self Care | Payer: BLUE CROSS/BLUE SHIELD | Source: Ambulatory Visit | Attending: Cardiology | Admitting: Cardiology

## 2017-11-20 DIAGNOSIS — I214 Non-ST elevation (NSTEMI) myocardial infarction: Secondary | ICD-10-CM | POA: Diagnosis not present

## 2017-11-20 DIAGNOSIS — Z48812 Encounter for surgical aftercare following surgery on the circulatory system: Secondary | ICD-10-CM | POA: Diagnosis not present

## 2017-11-20 DIAGNOSIS — Z955 Presence of coronary angioplasty implant and graft: Secondary | ICD-10-CM | POA: Diagnosis not present

## 2017-11-20 LAB — GLUCOSE, CAPILLARY: Glucose-Capillary: 101 mg/dL — ABNORMAL HIGH (ref 65–99)

## 2017-11-22 ENCOUNTER — Other Ambulatory Visit: Payer: Self-pay | Admitting: Podiatry

## 2017-11-23 ENCOUNTER — Encounter (HOSPITAL_COMMUNITY)
Admission: RE | Admit: 2017-11-23 | Discharge: 2017-11-23 | Disposition: A | Discharge status: Home / Self Care | Payer: BLUE CROSS/BLUE SHIELD | Source: Ambulatory Visit | Attending: Cardiology | Admitting: Cardiology

## 2017-11-23 ENCOUNTER — Encounter (HOSPITAL_COMMUNITY): Payer: BLUE CROSS/BLUE SHIELD

## 2017-11-23 DIAGNOSIS — Z955 Presence of coronary angioplasty implant and graft: Secondary | ICD-10-CM | POA: Diagnosis not present

## 2017-11-23 DIAGNOSIS — I214 Non-ST elevation (NSTEMI) myocardial infarction: Secondary | ICD-10-CM

## 2017-11-23 DIAGNOSIS — Z48812 Encounter for surgical aftercare following surgery on the circulatory system: Secondary | ICD-10-CM | POA: Diagnosis not present

## 2017-11-25 ENCOUNTER — Encounter (HOSPITAL_COMMUNITY): Payer: BLUE CROSS/BLUE SHIELD

## 2017-11-25 ENCOUNTER — Encounter (HOSPITAL_COMMUNITY)
Admission: RE | Admit: 2017-11-25 | Discharge: 2017-11-25 | Disposition: A | Discharge status: Home / Self Care | Payer: BLUE CROSS/BLUE SHIELD | Source: Ambulatory Visit | Attending: Cardiology | Admitting: Cardiology

## 2017-11-25 DIAGNOSIS — Z48812 Encounter for surgical aftercare following surgery on the circulatory system: Secondary | ICD-10-CM | POA: Diagnosis not present

## 2017-11-25 DIAGNOSIS — I214 Non-ST elevation (NSTEMI) myocardial infarction: Secondary | ICD-10-CM

## 2017-11-25 DIAGNOSIS — Z955 Presence of coronary angioplasty implant and graft: Secondary | ICD-10-CM

## 2017-11-25 NOTE — Progress Notes (Signed)
I have reviewed a Home Exercise Prescription with Carmen Anderson . Carmen Anderson is currently exercising at home. The patient was advised to continue walking 2-3 days a week for 30-45 minutes.  Carmen Anderson and I discussed how to progress their exercise prescription. The patient stated that they understand the exercise prescription.  We reviewed exercise guidelines, target heart rate during exercise, RPE Scale, weather, NTG use, endpoints for exercise, warmup and cool down. Patient is encouraged to come to me with any questions. I will continue to follow up with the patient to assist them with progression and safety.    York Cerise MS, ACSM CEP 10:21 AM 11/25/2017

## 2017-11-26 NOTE — Progress Notes (Signed)
Cardiac Individual Treatment Plan  Patient Details  Name: Carmen Anderson MRN: [id number] Date of Birth: July 23, 1960 Referring Provider:     CARDIAC REHAB PHASE II ORIENTATION from 11/12/2017 in [facility] CARDIAC REHAB  Referring Provider  Yates Decamp MD      Initial Encounter Date:    CARDIAC REHAB PHASE II ORIENTATION from 11/12/2017 in [facility] CARDIAC REHAB  Date  11/12/17  Referring Provider  Yates Decamp MD      Visit Diagnosis: Status post coronary artery stent placement  NSTEMI (non-ST elevated myocardial infarction) (HCC)  Patient's Home Medications on Admission:  Current Outpatient Medications:  .  aspirin EC 81 MG EC tablet, Take 1 tablet (81 mg total) by mouth daily., Disp: , Rfl:  .  atorvastatin (LIPITOR) 80 MG tablet, Take 1 tablet (80 mg total) by mouth daily at 6 PM., Disp: 90 tablet, Rfl: 3 .  empagliflozin (JARDIANCE) 10 MG TABS tablet, Take  daily before breakfast, drink plenty of water., Disp: 30 tablet, Rfl: 2 .  gabapentin (NEURONTIN) 100 MG capsule, Take 1 capsule (100 mg total) by mouth at bedtime., Disp: 90 capsule, Rfl: 3 .  levothyroxine (SYNTHROID, LEVOTHROID) 50 MCG tablet, Take 1 tablet (50 mcg total) by mouth daily., Disp: 90 tablet, Rfl: 3 .  lisinopril (PRINIVIL,ZESTRIL) 5 MG tablet, Take 1 tablet (5 mg total) by mouth daily., Disp: 90 tablet, Rfl: 3 .  meloxicam (MOBIC) 15 MG tablet, TAKE 1 TABLET BY MOUTH EVERY DAY, Disp: 30 tablet, Rfl: 2 .  metFORMIN (GLUCOPHAGE-XR) 500 MG 24 hr tablet, TAKE 1 TABLET BY MOUTH TWICE DAILY WITH MEALS AS DIRECTED, Disp: 180 tablet, Rfl: 3 .  nitroGLYCERIN (NITROSTAT) 0.4 MG SL tablet, Place 1 tablet (0.4 mg total) under the tongue every 5 (five) minutes x 3 doses as needed for chest pain., Disp: 25 tablet, Rfl: 3 .  ticagrelor (BRILINTA) 90 MG TABS tablet, Take 1 tablet (90 mg total) by mouth 2 (two) times daily., Disp: 180 tablet, Rfl: 3  Past Medical History: Past Medical  History:  Diagnosis Date  . DM type 2 (diabetes mellitus, type 2) (HCC)   . History of hiatal hernia   . Hyperlipidemia   . Hypertension   . Hypothyroidism     Tobacco Use: Social History   Tobacco Use  Smoking Status Former Smoker  . Packs/day: 0.50  . Years: 27.00  . Pack years: 13.50  . Types: Cigarettes  . Last attempt to quit: 09/05/2017  . Years since quitting: 0.2  Smokeless Tobacco Never Used    Labs: Recent Review Flowsheet Data    Labs for ITP Cardiac and Pulmonary Rehab Latest Ref Rng & Units 04/13/2017 08/14/2017 09/05/2017 09/06/2017 10/20/2017   Cholestrol 100 - 199 mg/dL - - 409(W) - 119   LDLCALC 0 - 99 mg/dL - - 147(W) - 52   HDL >29 mg/dL - - 46 - 43   Trlycerides 0 - 149 mg/dL - - 562 - 74   Hemoglobin A1c 4.8 - 5.6 % 6.8 7 - 6.9(H) -      Capillary Blood Glucose: Lab Results  Component Value Date   GLUCAP 101 (H) 11/20/2017   GLUCAP 115 (H) 11/18/2017   GLUCAP 116 (H) 11/18/2017   GLUCAP 196 (H) 11/12/2017   GLUCAP 93 09/08/2017     Exercise Target Goals:    Exercise Program Goal: Individual exercise prescription set using results from initial 6 min walk test and THRR while considering  patient's  activity barriers and safety.   Exercise Prescription Goal: Initial exercise prescription builds to 30-45 minutes a day of aerobic activity, 2-3 days per week.  Home exercise guidelines will be given to patient during program as part of exercise prescription that the participant will acknowledge.  Activity Barriers & Risk Stratification: Activity Barriers & Cardiac Risk Stratification - 11/12/17 0817      Activity Barriers & Cardiac Risk Stratification   Activity Barriers  Deconditioning;Shortness of Breath;Muscular Weakness;Other (comment)    Comments  B neuropathy    Cardiac Risk Stratification  High       6 Minute Walk: 6 Minute Walk    Row Name 11/12/17 1107         6 Minute Walk   Phase  Initial     Distance  1633 feet     Walk Time  6  minutes     # of Rest Breaks  0     MPH  3.1     METS  4.5     RPE  11     VO2 Peak  15.9     Symptoms  No     Resting HR  69 bpm     Resting BP  106/56     Resting Oxygen Saturation   100 %     Exercise Oxygen Saturation  during 6 min walk  100 %     Max Ex. HR  95 bpm     Max Ex. BP  124/60     2 Minute Post BP  112/60        Oxygen Initial Assessment:   Oxygen Re-Evaluation:   Oxygen Discharge (Final Oxygen Re-Evaluation):   Initial Exercise Prescription: Initial Exercise Prescription - 11/12/17 1100      Date of Initial Exercise RX and Referring Provider   Date  11/12/17    Referring Provider  Yates Decamp MD      Treadmill   MPH  2.7    Grade  0    Minutes  10    METs  3.07      Bike   Level  0.7    Minutes  10    METs  3.64      NuStep   Level  3    SPM  80    Minutes  10    METs  3      Prescription Details   Frequency (times per week)  3    Duration  Progress to 30 minutes of continuous aerobic without signs/symptoms of physical distress      Intensity   THRR 40-80% of Max Heartrate  66-132    Ratings of Perceived Exertion  11-13    Perceived Dyspnea  0-4      Progression   Progression  Continue to progress workloads to maintain intensity without signs/symptoms of physical distress.      Resistance Training   Training Prescription  Yes    Weight  3lbs    Reps  10-15       Perform Capillary Blood Glucose checks as needed.  Exercise Prescription Changes: Exercise Prescription Changes    Row Name 11/18/17 1100 11/25/17 1000           Response to Exercise   Blood Pressure (Admit)  98/56  110/60      Blood Pressure (Exercise)  134/80  148/80      Blood Pressure (Exit)  112/60  112/60      Heart Rate (  Admit)  72 bpm  85 bpm      Heart Rate (Exercise)  113 bpm  110 bpm      Heart Rate (Exit)  64 bpm  62 bpm      Rating of Perceived Exertion (Exercise)  13  9      Perceived Dyspnea (Exercise)  0  0      Symptoms  None  None        Comments  Pt oriented to exericse equipment  -      Duration  Progress to 30 minutes of  aerobic without signs/symptoms of physical distress  Continue with 30 min of aerobic exercise without signs/symptoms of physical distress.      Intensity  THRR New  THRR unchanged        Progression   Progression  Continue to progress workloads to maintain intensity without signs/symptoms of physical distress.  Continue to progress workloads to maintain intensity without signs/symptoms of physical distress.      Average METs  2.83  3.71        Resistance Training   Training Prescription  No  No        Interval Training   Interval Training  No  No        Treadmill   MPH  2.5  2.7      Grade  0  3      Minutes  10  10      METs  2.91  3.81        Bike   Level  0.7  0.7      Minutes  10  10      METs  3.62  3.61        NuStep   Level  2  4      SPM  85  85      Minutes  10  10      METs  2.5  3.7        Home Exercise Plan   Plans to continue exercise at  -  Home (comment) [employer]      Frequency  -  Add 2 additional days to program exercise sessions.      Initial Home Exercises Provided  -  11/25/17         Exercise Comments: Exercise Comments    Row Name 11/18/17 1119 11/25/17 1027 11/25/17 1029       Exercise Comments  Pt's first day of exercise. Pt off to good start with exercise prescription. Pt oriented to exercise equipment. Will continue to monitor  Reviewed METs, goals and HEP with pt. Pt is responding well to her exercise prescription.   Reviewed METs, goals and HEP with pt. Pt is responding well to her exercise prescription. Pt is currently walking 2 days at a local school track. Will continue to monitor and follow up with pt.         Exercise Goals and Review: Exercise Goals    Row Name 11/12/17 0827             Exercise Goals   Increase Physical Activity  Yes       Intervention  Provide advice, education, support and counseling about physical  activity/exercise needs.;Develop an individualized exercise prescription for aerobic and resistive training based on initial evaluation findings, risk stratification, comorbidities and participant's personal goals.       Expected Outcomes  Short Term: Attend rehab on a regular basis to  increase amount of physical activity.;Long Term: Exercising regularly at least 3-5 days a week.;Long Term: Add in home exercise to make exercise part of routine and to increase amount of physical activity.       Increase Strength and Stamina  Yes increase walking tolerance; Increase tone in mid section       Intervention  Provide advice, education, support and counseling about physical activity/exercise needs.;Develop an individualized exercise prescription for aerobic and resistive training based on initial evaluation findings, risk stratification, comorbidities and participant's personal goals.       Expected Outcomes  Long Term: Improve cardiorespiratory fitness, muscular endurance and strength as measured by increased METs and functional capacity ( );Short Term: Perform resistance training exercises routinely during rehab and add in resistance training at home;Short Term: Increase workloads from initial exercise prescription for resistance, speed, and METs.       Able to understand and use rate of perceived exertion (RPE) scale  Yes       Intervention  Provide education and explanation on how to use RPE scale       Expected Outcomes  Short Term: Able to use RPE daily in rehab to express subjective intensity level;Long Term:  Able to use RPE to guide intensity level when exercising independently       Knowledge and understanding of Target Heart Rate Range (THRR)  Yes       Intervention  Provide education and explanation of THRR including how the numbers were predicted and where they are located for reference       Expected Outcomes  Short Term: Able to state/look up THRR;Long Term: Able to use THRR to govern intensity  when exercising independently;Short Term: Able to use daily as guideline for intensity in rehab       Able to check pulse independently  Yes       Intervention  Provide education and demonstration on how to check pulse in carotid and radial arteries.;Review the importance of being able to check your own pulse for safety during independent exercise       Expected Outcomes  Short Term: Able to explain why pulse checking is important during independent exercise;Long Term: Able to check pulse independently and accurately       Understanding of Exercise Prescription  Yes       Intervention  Provide education, explanation, and written materials on patient's individual exercise prescription       Expected Outcomes  Short Term: Able to explain program exercise prescription;Long Term: Able to explain home exercise prescription to exercise independently          Exercise Goals Re-Evaluation : Exercise Goals Re-Evaluation    Row Name 11/25/17 1029             Exercise Goal Re-Evaluation   Exercise Goals Review  Increase Physical Activity;Able to understand and use rate of perceived exertion (RPE) scale;Knowledge and understanding of Target Heart Rate Range (THRR);Understanding of Exercise Prescription;Increase Strength and Stamina;Able to check pulse independently       Comments  Reviewed HEP with pt. Also Reviewed pt RPE Scale, THRR, weather conditions, NTG use, endpoints of exercise, warmup and cool down.        Expected Outcomes  Pt will continue to walk 2-3 additional days for 30-45 minutes. Pt will continue to increase cardiovascular and muscular strength. Will continue to monitor and progress pt as tolerated.            Discharge Exercise Prescription (Final Exercise Prescription Changes):  Exercise Prescription Changes - 11/25/17 1000      Response to Exercise   Blood Pressure (Admit)  110/60    Blood Pressure (Exercise)  148/80    Blood Pressure (Exit)  112/60    Heart Rate (Admit)  85  bpm    Heart Rate (Exercise)  110 bpm    Heart Rate (Exit)  62 bpm    Rating of Perceived Exertion (Exercise)  9    Perceived Dyspnea (Exercise)  0    Symptoms  None     Duration  Continue with 30 min of aerobic exercise without signs/symptoms of physical distress.    Intensity  THRR unchanged      Progression   Progression  Continue to progress workloads to maintain intensity without signs/symptoms of physical distress.    Average METs  3.71      Resistance Training   Training Prescription  No      Interval Training   Interval Training  No      Treadmill   MPH  2.7    Grade  3    Minutes  10    METs  3.81      Bike   Level  0.7    Minutes  10    METs  3.61      NuStep   Level  4    SPM  85    Minutes  10    METs  3.7      Home Exercise Plan   Plans to continue exercise at  Home (comment) [employer]    Frequency  Add 2 additional days to program exercise sessions.    Initial Home Exercises Provided  11/25/17       Nutrition:  Target Goals: Understanding of nutrition guidelines, daily intake of sodium 1500mg , cholesterol 200mg , calories 30% from fat and 7% or less from saturated fats, daily to have 5 or more servings of fruits and vegetables.  Biometrics: Pre Biometrics - 11/12/17 1132      Pre Biometrics   % Body Fat  18.3 %        Nutrition Therapy Plan and Nutrition Goals: Nutrition Therapy & Goals - 11/12/17 0927      Nutrition Therapy   Diet  Consistent Carb, Heart Healthy      Personal Nutrition Goals   Nutrition Goal  CBG concentrations in the normal range or as close to normal as is safely possible.      Intervention Plan   Intervention  Prescribe, educate and counsel regarding individualized specific dietary modifications aiming towards targeted core components such as weight, hypertension, lipid management, diabetes, heart failure and other comorbidities.    Expected Outcomes  Short Term Goal: Understand basic principles of dietary  content, such as calories, fat, sodium, cholesterol and nutrients.;Long Term Goal: Adherence to prescribed nutrition plan.       Nutrition Assessments: Nutrition Assessments - 11/12/17 0927      MEDFICTS Scores   Pre Score  6       Nutrition Goals Re-Evaluation:   Nutrition Goals Re-Evaluation:   Nutrition Goals Discharge (Final Nutrition Goals Re-Evaluation):   Psychosocial: Target Goals: Acknowledge presence or absence of significant depression and/or stress, maximize coping skills, provide positive support system. Participant is able to verbalize types and ability to use techniques and skills needed for reducing stress and depression.  Initial Review & Psychosocial Screening: Initial Psych Review & Screening - 11/12/17 1132      Initial Review  Current issues with  Current Stress Concerns    Source of Stress Concerns  Occupation      Family Dynamics   Good Support System?  Yes Pt reports her spouse and sisters as her support system.       Barriers   Psychosocial barriers to participate in program  The patient should benefit from training in stress management and relaxation.      Screening Interventions   Interventions  Encouraged to exercise    Expected Outcomes  Long Term Goal: Stressors or current issues are controlled or eliminated. Pt will continue to exhibit a positive outlook utilizing good coping skills.        Quality of Life Scores: Quality of Life - 11/12/17 1134      Quality of Life Scores   Health/Function Pre  28 %    Socioeconomic Pre  26.57 %    Psych/Spiritual Pre  29.14 %    Family Pre  30 %    GLOBAL Pre  28.18 %      Scores of 19 and below usually indicate a poorer quality of life in these areas.  A difference of  2-3 points is a clinically meaningful difference.  A difference of 2-3 points in the total score of the Quality of Life Index has been associated with significant improvement in overall quality of life, self-image, physical  symptoms, and general health in studies assessing change in quality of life.  PHQ-9: Recent Review Flowsheet Data    Depression screen [facility] 2/9 11/18/2017 11/12/2017 10/07/2017 09/12/2017 01/02/2017   Decreased Interest 0 0 0 0 0   Down, Depressed, Hopeless 0 0 0 0 0   PHQ - 2 Score 0 0 0 0 0     Interpretation of Total Score  Total Score Depression Severity:  1-4 = Minimal depression, 5-9 = Mild depression, 10-14 = Moderate depression, 15-19 = Moderately severe depression, 20-27 = Severe depression   Psychosocial Evaluation and Intervention: Psychosocial Evaluation - 11/18/17 1622      Psychosocial Evaluation & Interventions   Interventions  Encouraged to exercise with the program and follow exercise prescription;Stress management education;Relaxation education    Comments  No psychosocial needs identified. No intervention necessary.    Expected Outcomes  Pt will exhibit positive outlook utilizing good coping skills.    Continue Psychosocial Services   No Follow up required       Psychosocial Re-Evaluation: Psychosocial Re-Evaluation    Row Name 11/25/17 1340             Psychosocial Re-Evaluation   Current issues with  None Identified       Comments  No psychosocial needs identified.  No interventions necessary.       Expected Outcomes  Pt will continue to exhibit a positve outlook utilizing good coping skills.       Interventions  Encouraged to attend Cardiac Rehabilitation for the exercise       Continue Psychosocial Services   No Follow up required          Psychosocial Discharge (Final Psychosocial Re-Evaluation): Psychosocial Re-Evaluation - 11/25/17 1340      Psychosocial Re-Evaluation   Current issues with  None Identified    Comments  No psychosocial needs identified.  No interventions necessary.    Expected Outcomes  Pt will continue to exhibit a positve outlook utilizing good coping skills.    Interventions  Encouraged to attend Cardiac Rehabilitation for the  exercise    Continue Psychosocial Services  No Follow up required       Vocational Rehabilitation: Provide vocational rehab assistance to qualifying candidates.   Vocational Rehab Evaluation & Intervention: Vocational Rehab - 11/12/17 1131      Initial Vocational Rehab Evaluation & Intervention   Assessment shows need for Vocational Rehabilitation  No       Education: Education Goals: Education classes will be provided on a weekly basis, covering required topics. Participant will state understanding/return demonstration of topics presented.  Learning Barriers/Preferences: Learning Barriers/Preferences - 11/12/17 0827      Learning Barriers/Preferences   Learning Barriers  Sight;Hearing    Learning Preferences  Skilled Demonstration       Education Topics: Count Your Pulse:  -Group instruction provided by verbal instruction, demonstration, patient participation and written materials to support subject.  Instructors address importance of being able to find your pulse and how to count your pulse when at home without a heart monitor.  Patients get hands on experience counting their pulse with staff help and individually.   Heart Attack, Angina, and Risk Factor Modification:  -Group instruction provided by verbal instruction, video, and written materials to support subject.  Instructors address signs and symptoms of angina and heart attacks.    Also discuss risk factors for heart disease and how to make changes to improve heart health risk factors.   Functional Fitness:  -Group instruction provided by verbal instruction, demonstration, patient participation, and written materials to support subject.  Instructors address safety measures for doing things around the house.  Discuss how to get up and down off the floor, how to pick things up properly, how to safely get out of a chair without assistance, and balance training.   Meditation and Mindfulness:  -Group instruction provided  by verbal instruction, patient participation, and written materials to support subject.  Instructor addresses importance of mindfulness and meditation practice to help reduce stress and improve awareness.  Instructor also leads participants through a meditation exercise.    Stretching for Flexibility and Mobility:  -Group instruction provided by verbal instruction, patient participation, and written materials to support subject.  Instructors lead participants through series of stretches that are designed to increase flexibility thus improving mobility.  These stretches are additional exercise for major muscle groups that are typically performed during regular warm up and cool down.   Hands Only CPR:  -Group verbal, video, and participation provides a basic overview of AHA guidelines for community CPR. Role-play of emergencies allow participants the opportunity to practice calling for help and chest compression technique with discussion of AED use.   Hypertension: -Group verbal and written instruction that provides a basic overview of hypertension including the most recent diagnostic guidelines, risk factor reduction with self-care instructions and medication management.    Nutrition I class: Heart Healthy Eating:  -Group instruction provided by PowerPoint slides, verbal discussion, and written materials to support subject matter. The instructor gives an explanation and review of the Therapeutic Lifestyle Changes diet recommendations, which includes a discussion on lipid goals, dietary fat, sodium, fiber, plant stanol/sterol esters, sugar, and the components of a well-balanced, healthy diet.   Nutrition II class: Lifestyle Skills:  -Group instruction provided by PowerPoint slides, verbal discussion, and written materials to support subject matter. The instructor gives an explanation and review of label reading, grocery shopping for heart health, heart healthy recipe modifications, and ways to  make healthier choices when eating out.   Diabetes Question & Answer:  -Group instruction provided by PowerPoint slides, verbal discussion, and  written materials to support subject matter. The instructor gives an explanation and review of diabetes co-morbidities, pre- and post-prandial blood glucose goals, pre-exercise blood glucose goals, signs, symptoms, and treatment of hypoglycemia and hyperglycemia, and foot care basics.   Diabetes Blitz:  -Group instruction provided by PowerPoint slides, verbal discussion, and written materials to support subject matter. The instructor gives an explanation and review of the physiology behind type 1 and type 2 diabetes, diabetes medications and rational behind using different medications, pre- and post-prandial blood glucose recommendations and Hemoglobin A1c goals, diabetes diet, and exercise including blood glucose guidelines for exercising safely.    Portion Distortion:  -Group instruction provided by PowerPoint slides, verbal discussion, written materials, and food models to support subject matter. The instructor gives an explanation of serving size versus portion size, changes in portions sizes over the last 20 years, and what consists of a serving from each food group.   Stress Management:  -Group instruction provided by verbal instruction, video, and written materials to support subject matter.  Instructors review role of stress in heart disease and how to cope with stress positively.     Exercising on Your Own:  -Group instruction provided by verbal instruction, power point, and written materials to support subject.  Instructors discuss benefits of exercise, components of exercise, frequency and intensity of exercise, and end points for exercise.  Also discuss use of nitroglycerin and activating EMS.  Review options of places to exercise outside of rehab.  Review guidelines for sex with heart disease.   Cardiac Drugs I:  -Group instruction  provided by verbal instruction and written materials to support subject.  Instructor reviews cardiac drug classes: antiplatelets, anticoagulants, beta blockers, and statins.  Instructor discusses reasons, side effects, and lifestyle considerations for each drug class.   Cardiac Drugs II:  -Group instruction provided by verbal instruction and written materials to support subject.  Instructor reviews cardiac drug classes: angiotensin converting enzyme inhibitors (ACE-I), angiotensin II receptor blockers (ARBs), nitrates, and calcium channel blockers.  Instructor discusses reasons, side effects, and lifestyle considerations for each drug class.   Anatomy and Physiology of the Circulatory System:  Group verbal and written instruction and models provide basic cardiac anatomy and physiology, with the coronary electrical and arterial systems. Review of: AMI, Angina, Valve disease, Heart Failure, Peripheral Artery Disease, Cardiac Arrhythmia, Pacemakers, and the ICD.   Other Education:  -Group or individual verbal, written, or video instructions that support the educational goals of the cardiac rehab program.   Holiday Eating Survival Tips:  -Group instruction provided by PowerPoint slides, verbal discussion, and written materials to support subject matter. The instructor gives patients tips, tricks, and techniques to help them not only survive but enjoy the holidays despite the onslaught of food that accompanies the holidays.   Knowledge Questionnaire Score: Knowledge Questionnaire Score - 11/12/17 1107      Knowledge Questionnaire Score   Pre Score  22/24       Core Components/Risk Factors/Patient Goals at Admission: Personal Goals and Risk Factors at Admission - 11/12/17 1139      Core Components/Risk Factors/Patient Goals on Admission    Weight Management  Yes;Weight Maintenance    Intervention  Weight Management: Develop a combined nutrition and exercise program designed to reach desired  caloric intake, while maintaining appropriate intake of nutrient and fiber, sodium and fats, and appropriate energy expenditure required for the weight goal.;Weight Management: Provide education and appropriate resources to help participant work on and attain dietary goals.;Weight Management/Obesity: Establish  reasonable short term and long term weight goals.    Expected Outcomes  Short Term: Continue to assess and modify interventions until short term weight is achieved;Long Term: Adherence to nutrition and physical activity/exercise program aimed toward attainment of established weight goal;Weight Maintenance: Understanding of the daily nutrition guidelines, which includes 25-35% calories from fat, 7% or less cal from saturated fats, less than 200mg  cholesterol, less than 1.5gm of sodium, & 5 or more servings of fruits and vegetables daily;Weight Loss: Understanding of general recommendations for a balanced deficit meal plan, which promotes 1-2 lb weight loss per week and includes a negative energy balance of [phone] kcal/d;Understanding recommendations for meals to include 15-35% energy as protein, 25-35% energy from fat, 35-60% energy from carbohydrates, less than 200mg  of dietary cholesterol, 20-35 gm of total fiber daily;Understanding of distribution of calorie intake throughout the day with the consumption of 4-5 meals/snacks    Diabetes  Yes    Intervention  Provide education about signs/symptoms and action to take for hypo/hyperglycemia.;Provide education about proper nutrition, including hydration, and aerobic/resistive exercise prescription along with prescribed medications to achieve blood glucose in normal ranges: Fasting glucose 65-99 mg/dL    Expected Outcomes  Short Term: Participant verbalizes understanding of the signs/symptoms and immediate care of hyper/hypoglycemia, proper foot care and importance of medication, aerobic/resistive exercise and nutrition plan for blood glucose control.;Long  Term: Attainment of HbA1C < 7%.    Hypertension  Yes    Intervention  Provide education on lifestyle modifcations including regular physical activity/exercise, weight management, moderate sodium restriction and increased consumption of fresh fruit, vegetables, and low fat dairy, alcohol moderation, and smoking cessation.;Monitor prescription use compliance.    Expected Outcomes  Short Term: Continued assessment and intervention until BP is < 140/42mm HG in hypertensive participants. < 130/39mm HG in hypertensive participants with diabetes, heart failure or chronic kidney disease.;Long Term: Maintenance of blood pressure at goal levels.    Lipids  Yes    Intervention  Provide education and support for participant on nutrition & aerobic/resistive exercise along with prescribed medications to achieve LDL 70mg , HDL >40mg .    Expected Outcomes  Short Term: Participant states understanding of desired cholesterol values and is compliant with medications prescribed. Participant is following exercise prescription and nutrition guidelines.;Long Term: Cholesterol controlled with medications as prescribed, with individualized exercise RX and with personalized nutrition plan. Value goals: LDL < 70mg , HDL > 40 mg.    Stress  Yes    Intervention  Offer individual and/or small group education and counseling on adjustment to heart disease, stress management and health-related lifestyle change. Teach and support self-help strategies.;Refer participants experiencing significant psychosocial distress to appropriate mental health specialists for further evaluation and treatment. When possible, include family members and significant others in education/counseling sessions.    Expected Outcomes  Short Term: Participant demonstrates changes in health-related behavior, relaxation and other stress management skills, ability to obtain effective social support, and compliance with psychotropic medications if prescribed.;Long Term:  Emotional wellbeing is indicated by absence of clinically significant psychosocial distress or social isolation.       Core Components/Risk Factors/Patient Goals Review:  Goals and Risk Factor Review    Row Name 11/18/17 1618             Core Components/Risk Factors/Patient Goals Review   Personal Goals Review  Weight Management/Obesity;Diabetes;Lipids;Hypertension;Stress       Review  Pt with multiple CAD RF eager to participate in CR Program.        Expected  Outcomes  Pt with multiple CAD RF will continue to participate in CR exercise, diet, and lifestyle modification to reduce CV RF.           Core Components/Risk Factors/Patient Goals at Discharge (Final Review):  Goals and Risk Factor Review - 11/18/17 1618      Core Components/Risk Factors/Patient Goals Review   Personal Goals Review  Weight Management/Obesity;Diabetes;Lipids;Hypertension;Stress    Review  Pt with multiple CAD RF eager to participate in CR Program.     Expected Outcomes  Pt with multiple CAD RF will continue to participate in CR exercise, diet, and lifestyle modification to reduce CV RF.        ITP Comments: ITP Comments    Row Name 11/12/17 0815 11/25/17 1339         ITP Comments  Dr. Armanda Magic, Medical Director  30 Day ITP Review; Pt recently started cardiac rehab and is off to a great start.  Trivia is tolerating exercise well.         Comments: See ITP Comments.

## 2017-11-27 ENCOUNTER — Encounter (HOSPITAL_COMMUNITY)
Admission: RE | Admit: 2017-11-27 | Discharge: 2017-11-27 | Disposition: A | Discharge status: Home / Self Care | Payer: BLUE CROSS/BLUE SHIELD | Source: Ambulatory Visit | Attending: Cardiology | Admitting: Cardiology

## 2017-11-27 ENCOUNTER — Encounter (HOSPITAL_COMMUNITY): Payer: BLUE CROSS/BLUE SHIELD

## 2017-11-27 ENCOUNTER — Telehealth: Payer: Self-pay | Admitting: Family Medicine

## 2017-11-27 DIAGNOSIS — Z955 Presence of coronary angioplasty implant and graft: Secondary | ICD-10-CM | POA: Diagnosis not present

## 2017-11-27 DIAGNOSIS — I214 Non-ST elevation (NSTEMI) myocardial infarction: Secondary | ICD-10-CM

## 2017-11-27 DIAGNOSIS — Z48812 Encounter for surgical aftercare following surgery on the circulatory system: Secondary | ICD-10-CM | POA: Diagnosis not present

## 2017-11-27 NOTE — Telephone Encounter (Signed)
Mychart message sent to pt about changing her apt on 6/27 with Dr Creta Levin

## 2017-11-30 ENCOUNTER — Encounter (HOSPITAL_COMMUNITY): Payer: BLUE CROSS/BLUE SHIELD

## 2017-12-02 ENCOUNTER — Encounter (HOSPITAL_COMMUNITY)
Admission: RE | Admit: 2017-12-02 | Discharge: 2017-12-02 | Disposition: A | Discharge status: Home / Self Care | Payer: BLUE CROSS/BLUE SHIELD | Source: Ambulatory Visit | Attending: Cardiology | Admitting: Cardiology

## 2017-12-02 ENCOUNTER — Encounter (HOSPITAL_COMMUNITY): Payer: BLUE CROSS/BLUE SHIELD

## 2017-12-02 DIAGNOSIS — Z48812 Encounter for surgical aftercare following surgery on the circulatory system: Secondary | ICD-10-CM | POA: Diagnosis not present

## 2017-12-02 DIAGNOSIS — Z955 Presence of coronary angioplasty implant and graft: Secondary | ICD-10-CM

## 2017-12-02 DIAGNOSIS — I214 Non-ST elevation (NSTEMI) myocardial infarction: Secondary | ICD-10-CM | POA: Diagnosis not present

## 2017-12-04 ENCOUNTER — Other Ambulatory Visit: Payer: Self-pay | Admitting: Internal Medicine

## 2017-12-04 ENCOUNTER — Encounter (HOSPITAL_COMMUNITY)
Admission: RE | Admit: 2017-12-04 | Discharge: 2017-12-04 | Disposition: A | Discharge status: Home / Self Care | Payer: BLUE CROSS/BLUE SHIELD | Source: Ambulatory Visit | Attending: Cardiology | Admitting: Cardiology

## 2017-12-04 ENCOUNTER — Encounter (HOSPITAL_COMMUNITY): Payer: BLUE CROSS/BLUE SHIELD

## 2017-12-04 DIAGNOSIS — I214 Non-ST elevation (NSTEMI) myocardial infarction: Secondary | ICD-10-CM | POA: Diagnosis not present

## 2017-12-04 DIAGNOSIS — Z48812 Encounter for surgical aftercare following surgery on the circulatory system: Secondary | ICD-10-CM | POA: Diagnosis not present

## 2017-12-04 DIAGNOSIS — Z955 Presence of coronary angioplasty implant and graft: Secondary | ICD-10-CM | POA: Diagnosis not present

## 2017-12-09 ENCOUNTER — Encounter (HOSPITAL_COMMUNITY): Payer: BLUE CROSS/BLUE SHIELD

## 2017-12-09 ENCOUNTER — Encounter (HOSPITAL_COMMUNITY)
Admission: RE | Admit: 2017-12-09 | Discharge: 2017-12-09 | Disposition: A | Discharge status: Home / Self Care | Payer: BLUE CROSS/BLUE SHIELD | Source: Ambulatory Visit | Attending: Cardiology | Admitting: Cardiology

## 2017-12-09 DIAGNOSIS — I214 Non-ST elevation (NSTEMI) myocardial infarction: Secondary | ICD-10-CM

## 2017-12-09 DIAGNOSIS — Z955 Presence of coronary angioplasty implant and graft: Secondary | ICD-10-CM

## 2017-12-09 DIAGNOSIS — Z48812 Encounter for surgical aftercare following surgery on the circulatory system: Secondary | ICD-10-CM | POA: Diagnosis not present

## 2017-12-11 ENCOUNTER — Encounter (HOSPITAL_COMMUNITY)
Admission: RE | Admit: 2017-12-11 | Discharge: 2017-12-11 | Disposition: A | Discharge status: Home / Self Care | Payer: BLUE CROSS/BLUE SHIELD | Source: Ambulatory Visit | Attending: Cardiology | Admitting: Cardiology

## 2017-12-11 ENCOUNTER — Encounter (HOSPITAL_COMMUNITY): Payer: BLUE CROSS/BLUE SHIELD

## 2017-12-11 DIAGNOSIS — I214 Non-ST elevation (NSTEMI) myocardial infarction: Secondary | ICD-10-CM | POA: Diagnosis not present

## 2017-12-11 DIAGNOSIS — Z955 Presence of coronary angioplasty implant and graft: Secondary | ICD-10-CM

## 2017-12-11 DIAGNOSIS — Z48812 Encounter for surgical aftercare following surgery on the circulatory system: Secondary | ICD-10-CM | POA: Diagnosis not present

## 2017-12-14 ENCOUNTER — Encounter (HOSPITAL_COMMUNITY): Payer: BLUE CROSS/BLUE SHIELD

## 2017-12-14 ENCOUNTER — Encounter (HOSPITAL_COMMUNITY)
Admission: RE | Admit: 2017-12-14 | Discharge: 2017-12-14 | Disposition: A | Discharge status: Home / Self Care | Payer: BLUE CROSS/BLUE SHIELD | Source: Ambulatory Visit | Attending: Cardiology | Admitting: Cardiology

## 2017-12-14 ENCOUNTER — Ambulatory Visit: Payer: BLUE CROSS/BLUE SHIELD | Admitting: Internal Medicine

## 2017-12-14 ENCOUNTER — Telehealth: Payer: Self-pay

## 2017-12-14 ENCOUNTER — Encounter: Payer: Self-pay | Admitting: Internal Medicine

## 2017-12-14 VITALS — BP 120/70 | HR 61 | Ht 63.5 in | Wt 112.6 lb

## 2017-12-14 DIAGNOSIS — E785 Hyperlipidemia, unspecified: Secondary | ICD-10-CM

## 2017-12-14 DIAGNOSIS — Z955 Presence of coronary angioplasty implant and graft: Secondary | ICD-10-CM | POA: Diagnosis not present

## 2017-12-14 DIAGNOSIS — E1140 Type 2 diabetes mellitus with diabetic neuropathy, unspecified: Secondary | ICD-10-CM

## 2017-12-14 DIAGNOSIS — I214 Non-ST elevation (NSTEMI) myocardial infarction: Secondary | ICD-10-CM

## 2017-12-14 DIAGNOSIS — E1142 Type 2 diabetes mellitus with diabetic polyneuropathy: Secondary | ICD-10-CM

## 2017-12-14 DIAGNOSIS — Z48812 Encounter for surgical aftercare following surgery on the circulatory system: Secondary | ICD-10-CM | POA: Insufficient documentation

## 2017-12-14 LAB — POCT GLYCOSYLATED HEMOGLOBIN (HGB A1C): Hemoglobin A1C: 6.6 % — AB (ref 4.0–5.6)

## 2017-12-14 MED ORDER — METFORMIN HCL ER 500 MG PO TB24: ORAL_TABLET | ORAL | 3 refills | Status: DC

## 2017-12-14 MED ORDER — EMPAGLIFLOZIN 10 MG PO TABS: 10.0000 mg | ORAL_TABLET | Freq: Every day | ORAL | 3 refills | Status: DC

## 2017-12-14 NOTE — Telephone Encounter (Signed)
Pt came in office today requesting a work letter stating she could not be in the heat while working. Routing to Dr. Tresa EndoKelly.

## 2017-12-14 NOTE — Addendum Note (Signed)
Addended by: Yolande JollyLAWSON, Brainard Highfill on: 12/14/2017 04:49 PM   Modules accepted: Orders

## 2017-12-14 NOTE — Patient Instructions (Signed)
Please continue  - Metformin ER 500 mg 2x a day with meals - Jardiance 10 mg before breakfast  Please stop at the lab.  Please return in 4 months with your sugar log.

## 2017-12-14 NOTE — Progress Notes (Signed)
Patient ID: Carmen Anderson, female   DOB: 01/04/1961, 57 y.o.   MRN: [id number]  HPI: Carmen LevyCarla L Anderson is a 57 y.o.-year-old female, initially referred by her PCP, Olean Reeeborah Gessner, NP, for management of DM2, dx in 2001-2002, non-insulin-dependent, now more controlled, with complications (PN). Last visit 4 months ago.  At last visit, she was told that she had more stress at work.  In 08/2017, she had an NSTEMI >> cath: 90 blockage on LAD >> stent.  Afterwards, cardiology suggested to start Jardiance, which we did. She is in cardiac rehab at 6:45 am. Quit smoking. Now low fat, low Na diet. She lost 7 lbs since last visit.  Last hemoglobin A1c was: Lab Results  Component Value Date   HGBA1C 6.9 (H) 09/06/2017   HGBA1C 7 08/14/2017   HGBA1C 6.8 04/13/2017   Pt is on a regimen of: - Metformin ER 500 mg 2x a day, with meals (decreased from 3x a day Spring 2016) -tolerates this well - Januvia 100 mg daily in am >> Jardiance 10 mg before b'fast  Pt checks her sugars twice a day - am:  70-128, 260 x1 >> 86-121, 128 >> 76-98, 127 >> 96-139 - 2h after b'fast: 115-169 >> 132-179 >> 86, 130-174, 192 >> 89-154, 167 - before lunch:77-99 >> 76-125 >> 86-110 >> 79-99, 119 >> 84-129 - 2h after lunch: 125-154, 170, 200 >> 148-174 >> 144-173 >> 101-129, 158 - before dinner: 75-122, 140 >> 87-128, 136 >> 86-120 >> 96-122 - 2h after dinner: 128-172, 180 >> 150-190 >> 149-179 >>107-163, 172 - bedtime: 99-140 >>95-122, 160 >> 98-125, 178 >> 98-124 - nighttime: n/c >> n/c Lowest sugar was 38 ... >> 86 >> 76 >> 84; she has hypoglycemia awareness in the 50s. Highest sugar was 260 x1 >> 190 >> 192 >> 172.  Glucometer: FreeStyle >> [facility]  Pt's meals are: - snack:  1/2 yoghurt, PB + bread - Breakfast: wheat waffle + sugar free syrup or wheat muffin w/ 1 egg  - Lunch: salads or [place] sandwich on wheat bread - Dinner: soups, salad, meat 2x a weekly (chicken, salmon) + veggies - Snacks: pineapple, celery  -no   CKD, last BUN/creatinine:  Lab Results  Component Value Date   BUN 12 09/08/2017   CREATININE 0.66 09/08/2017  On lisinopril. -+ HL; last set of lipids: Lab Results  Component Value Date   CHOL 110 10/20/2017   HDL 43 10/20/2017   LDLCALC 52 10/20/2017   TRIG 74 10/20/2017   CHOLHDL 2.6 10/20/2017  On Lipitor, fish oil. - last eye exam was in 07/2017: No DR; [facility].  - + Numbness and tingling in her feet.  Continues Neurontin 100 mg capsules at bedtime.  She is on B complex daily.  + HTN,  hypothyroidism (on levothyroxine 50 mcg daily).    Last TSH normal: Lab Results  Component Value Date   TSH 1.473 09/05/2017    ROS: Constitutional: + weight gain/no weight loss, no fatigue, no subjective hyperthermia, no subjective hypothermia Eyes: no blurry vision, no xerophthalmia ENT: no sore throat, no nodules palpated in throat, no dysphagia, no odynophagia, no hoarseness Cardiovascular: no CP/no SOB/no palpitations/no leg swelling Respiratory: no cough/no SOB/no wheezing Gastrointestinal: no N/no V/no D/no C/no acid reflux Musculoskeletal: no muscle aches/no joint aches Skin: no rashes, no hair loss Neurological: no tremors/+ numbness/+ tingling/no dizziness  I reviewed pt's medications, allergies, PMH, social hx, family hx, and changes were documented in the history of present illness.  Otherwise, unchanged from my initial visit note.  Past Medical History:  Diagnosis Date  . DM type 2 (diabetes mellitus, type 2) (HCC)   . History of hiatal hernia   . Hyperlipidemia   . Hypertension   . Hypothyroidism    Past Surgical History:  Procedure Laterality Date  . ABDOMINAL HYSTERECTOMY  1995  . CARDIAC CATHETERIZATION    . CORONARY STENT INTERVENTION N/A 09/07/2017   Procedure: CORONARY STENT INTERVENTION;  Surgeon: Runell Gess, MD;  Location: [facility];  Service: Cardiovascular;  Laterality: N/A;  . LEFT HEART CATH AND CORONARY ANGIOGRAPHY N/A 09/07/2017    Procedure: LEFT HEART CATH AND CORONARY ANGIOGRAPHY;  Surgeon: Runell Gess, MD;  Location: [facility] CV [facility];  Service: Cardiovascular;  Laterality: N/A;  . STAPEDECTOMY Right ~ 2002   Social History   Social History  . Marital Status: Married    Spouse Name: N/A  . Number of Children: 0  . Years of Education: 12   Occupational History  . property asst. Production designer, theatre/television/film    Social History Main Topics  . Smoking status: Current Every Day Smoker -- 0.50 packs/day for 10 years    Types: Cigarettes  . Smokeless tobacco: Not on file     Comment: pt states thinking of quitting  . Alcohol Use: No  . Drug Use: No   Social History Narrative   Exercise: walking, 4 times/week for 30 minutes.   Current Outpatient Medications on File Prior to Visit  Medication Sig Dispense Refill  . aspirin EC 81 MG EC tablet Take 1 tablet (81 mg total) by mouth daily.    [name] atorvastatin (LIPITOR) 80 MG tablet Take 1 tablet (80 mg total) by mouth daily at 6 PM. 90 tablet 3  . gabapentin (NEURONTIN) 100 MG capsule Take 1 capsule (100 mg total) by mouth at bedtime. 90 capsule 3  . JARDIANCE 10 MG TABS tablet TAKE 10MG  DAILY BEFORE BREAKFAST, DRINK PLENTY OF WATER. 30 tablet 2  . levothyroxine (SYNTHROID, LEVOTHROID) 50 MCG tablet Take 1 tablet (50 mcg total) by mouth daily. 90 tablet 3  . lisinopril (PRINIVIL,ZESTRIL) 5 MG tablet Take 1 tablet (5 mg total) by mouth daily. 90 tablet 3  . meloxicam (MOBIC) 15 MG tablet TAKE 1 TABLET BY MOUTH EVERY DAY 30 tablet 2  . metFORMIN (GLUCOPHAGE-XR) 500 MG 24 hr tablet TAKE 1 TABLET BY MOUTH TWICE DAILY WITH MEALS AS DIRECTED 180 tablet 3  . nitroGLYCERIN (NITROSTAT) 0.4 MG SL tablet Place 1 tablet (0.4 mg total) under the tongue every 5 (five) minutes x 3 doses as needed for chest pain. 25 tablet 3  . ticagrelor (BRILINTA) 90 MG TABS tablet Take 1 tablet (90 mg total) by mouth 2 (two) times daily. 180 tablet 3   No current facility-administered medications on file prior to  visit.    Allergies  Allergen Reactions  . Aspirin Other (See Comments)    Burns stomach  . Codeine Rash   Family History  Problem Relation Age of Onset  . Diabetes Mother   . Heart disease Mother   . Stroke Father        2011  . Hyperlipidemia Father   . Diabetes Sister   . Heart disease Sister   . Heart disease Brother        congenital   PE: BP 120/70   Pulse 61   Ht 5' 3.5" (1.613 m)   Wt 112 lb 9.6 oz (51.1 kg)   SpO2  99%   BMI 19.63 kg/m  Body mass index is 19.63 kg/m. Wt Readings from Last 3 Encounters:  12/14/17 112 lb 9.6 oz (51.1 kg)  11/16/17 112 lb 12.8 oz (51.2 kg)  11/12/17 111 lb 5.3 oz (50.5 kg)   Constitutional: normal weight, in NAD Eyes: PERRLA, EOMI, no exophthalmos ENT: moist mucous membranes, no thyromegaly, no cervical lymphadenopathy Cardiovascular: RRR, No MRG Respiratory: CTA B Gastrointestinal: abdomen soft, NT, ND, BS+ Musculoskeletal: no deformities, strength intact in all 4 Skin: moist, warm, no rashes Neurological: no tremor with outstretched hands, DTR normal in all 4  ASSESSMENT: 1. DM2, non-insulin-dependent, now more controlled, with complications - PN   2. PN   3. HL  PLAN:  1. Patient with long-standing, mild, type 2 diabetes, more controlled in the last year.  At last visit, since sugars were well controlled, we did not change her regimen however, at that time, she was telling me that she had more stress at work.  She was admitted with NSTEMI in 08/2017 >> stent placed. Her cardiologist suggested Jardiance.  We started this since last visit. No SEs. Stays well hydrated. - At this visit, sugars are well controlled per review of her log >> no change needed in her medications - no need to change her regimen for now - We will check a BMP today since she did not have one after starting Jardiance - I suggested to:  Patient Instructions  Please continue  - Metformin ER 500 mg 2x a day with meals - Jardiance 10 mg before  breakfast  Please return in 4 months with your sugar log.   - today, HbA1c is 6.6% (better!) - continue checking sugars at different times of the day - check 1x a day, rotating checks - advised for yearly eye exams >> she is UTD - Return to clinic in 4 mo with sugar log     2. Diabetic PN -No new complaints, this is stable -She continues on: - B complex 1 tablet daily - Neurontin 100 mg daily  3. HL - Reviewed latest lipid panel from 10/2017: Excellent! - Continues the Lipitor and fish oil without side effects.   Carlus Pavlov, MD PhD [facility]

## 2017-12-15 LAB — BASIC METABOLIC PANEL WITH GFR
BUN/Creatinine Ratio: 19 (calc) (ref 6–22)
BUN: 20 mg/dL (ref 7–25)
CO2: 20 mmol/L (ref 20–32)
Calcium: 9.8 mg/dL (ref 8.6–10.4)
Chloride: 109 mmol/L (ref 98–110)
Creat: 1.07 mg/dL — ABNORMAL HIGH (ref 0.50–1.05)
GFR, Est African American: 67 mL/min/{1.73_m2} (ref >=60)
GFR, Est Non African American: 58 mL/min/{1.73_m2} — ABNORMAL LOW (ref >=60)
Glucose, Bld: 133 mg/dL — ABNORMAL HIGH (ref 65–99)
Potassium: 4.5 mmol/L (ref 3.5–5.3)
Sodium: 143 mmol/L (ref 135–146)

## 2017-12-15 LAB — SPECIMEN COMPROMISED

## 2017-12-16 ENCOUNTER — Encounter (HOSPITAL_COMMUNITY): Payer: BLUE CROSS/BLUE SHIELD

## 2017-12-16 ENCOUNTER — Encounter: Payer: Self-pay | Admitting: Cardiovascular Disease

## 2017-12-16 ENCOUNTER — Encounter (HOSPITAL_COMMUNITY)
Admission: RE | Admit: 2017-12-16 | Discharge: 2017-12-16 | Disposition: A | Discharge status: Home / Self Care | Payer: BLUE CROSS/BLUE SHIELD | Source: Ambulatory Visit | Attending: Cardiology | Admitting: Cardiology

## 2017-12-16 DIAGNOSIS — Z48812 Encounter for surgical aftercare following surgery on the circulatory system: Secondary | ICD-10-CM | POA: Diagnosis not present

## 2017-12-16 DIAGNOSIS — Z955 Presence of coronary angioplasty implant and graft: Secondary | ICD-10-CM | POA: Diagnosis not present

## 2017-12-16 DIAGNOSIS — I214 Non-ST elevation (NSTEMI) myocardial infarction: Secondary | ICD-10-CM

## 2017-12-16 NOTE — Telephone Encounter (Signed)
Follow Up:   Pt called and wanted to know what is the status of her note that she dropped off on Monday(12-14-17)?

## 2017-12-16 NOTE — Telephone Encounter (Signed)
Returned call to the patient. No answer and phone rang busy.

## 2017-12-18 ENCOUNTER — Encounter (HOSPITAL_COMMUNITY): Payer: Self-pay

## 2017-12-18 ENCOUNTER — Telehealth: Payer: Self-pay | Admitting: Cardiovascular Disease

## 2017-12-18 ENCOUNTER — Encounter (HOSPITAL_COMMUNITY): Payer: BLUE CROSS/BLUE SHIELD

## 2017-12-18 ENCOUNTER — Encounter (HOSPITAL_COMMUNITY)
Admission: RE | Admit: 2017-12-18 | Discharge: 2017-12-18 | Disposition: A | Discharge status: Home / Self Care | Payer: BLUE CROSS/BLUE SHIELD | Source: Ambulatory Visit | Attending: Cardiology | Admitting: Cardiology

## 2017-12-18 DIAGNOSIS — Z48812 Encounter for surgical aftercare following surgery on the circulatory system: Secondary | ICD-10-CM | POA: Diagnosis not present

## 2017-12-18 DIAGNOSIS — Z955 Presence of coronary angioplasty implant and graft: Secondary | ICD-10-CM

## 2017-12-18 DIAGNOSIS — I214 Non-ST elevation (NSTEMI) myocardial infarction: Secondary | ICD-10-CM | POA: Diagnosis not present

## 2017-12-18 NOTE — Telephone Encounter (Signed)
New Message   Patient is calling to obtain an update on the request that she made to have Dr. 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 Advising that medically she is unable to work under those conditions. Please call to discuss.

## 2017-12-18 NOTE — Telephone Encounter (Signed)
Spoke to patient, aware that Dr. Tresa EndoKelly is out of office.  She states they are wanting her to work (starting Monday) in a building with no AC or windows.   She does not think she can do this.    Advised I would send message to Dr. Tresa EndoKelly and try to get this done today but if not if will be Monday, she is aware and verbalized understanding.   Request letter faxed to [phone]

## 2017-12-19 NOTE — Telephone Encounter (Signed)
.    I will be in the office on Monday 12/21/2017

## 2017-12-21 ENCOUNTER — Encounter (HOSPITAL_COMMUNITY): Payer: BLUE CROSS/BLUE SHIELD

## 2017-12-21 ENCOUNTER — Encounter (HOSPITAL_COMMUNITY)
Admission: RE | Admit: 2017-12-21 | Discharge: 2017-12-21 | Disposition: A | Discharge status: Home / Self Care | Payer: BLUE CROSS/BLUE SHIELD | Source: Ambulatory Visit | Attending: Cardiology | Admitting: Cardiology

## 2017-12-21 DIAGNOSIS — Z955 Presence of coronary angioplasty implant and graft: Secondary | ICD-10-CM

## 2017-12-21 DIAGNOSIS — I214 Non-ST elevation (NSTEMI) myocardial infarction: Secondary | ICD-10-CM | POA: Diagnosis not present

## 2017-12-21 DIAGNOSIS — Z48812 Encounter for surgical aftercare following surgery on the circulatory system: Secondary | ICD-10-CM | POA: Diagnosis not present

## 2017-12-21 NOTE — Telephone Encounter (Signed)
Letter faxed and patient aware.

## 2017-12-22 NOTE — Telephone Encounter (Signed)
Left message to call back and also advised that letter is available for pick up.

## 2017-12-23 ENCOUNTER — Encounter (HOSPITAL_COMMUNITY)
Admission: RE | Admit: 2017-12-23 | Discharge: 2017-12-23 | Disposition: A | Discharge status: Home / Self Care | Payer: BLUE CROSS/BLUE SHIELD | Source: Ambulatory Visit | Attending: Cardiology | Admitting: Cardiology

## 2017-12-23 ENCOUNTER — Encounter (HOSPITAL_COMMUNITY): Payer: BLUE CROSS/BLUE SHIELD

## 2017-12-23 DIAGNOSIS — Z955 Presence of coronary angioplasty implant and graft: Secondary | ICD-10-CM

## 2017-12-23 DIAGNOSIS — Z48812 Encounter for surgical aftercare following surgery on the circulatory system: Secondary | ICD-10-CM | POA: Diagnosis not present

## 2017-12-23 DIAGNOSIS — I214 Non-ST elevation (NSTEMI) myocardial infarction: Secondary | ICD-10-CM

## 2017-12-23 NOTE — Telephone Encounter (Signed)
Left message too call back

## 2017-12-23 NOTE — Progress Notes (Signed)
Carmen Anderson 57 y.o. female DOB: 03-07-1961 MRN: [id number]      Nutrition Note  1. Status post coronary artery stent placement   2. NSTEMI (non-ST elevated myocardial infarction) ([place])    Meds reviewed. Jardiance, Metformin XR noted  Current tobacco use? No Recently quit tobacco use 09/05/17  Labs:  Lab Results  Component Value Date   HGBA1C 6.6 (A) 12/14/2017   CBG (last 3)  No results for input(s): GLUCAP in the last 72 hours.  Nutrition Note Spoke with pt. Nutrition plan and survey reviewed with pt. Pt is following a heart healthy diet. Pt is diabetic. Last A1c indicates blood glucose well-controlled. Pt checks CBG's 2 times a day. Per discussion, pt is doing well managing tobacco cessation. Ways to manage stress once pt transitions from using Nicotine gum discussed. Pt expressed understanding of the information reviewed. Pt aware of nutrition education classes offered.  Nutrition Diagnosis ? Food-and nutrition-related knowledge deficit related to lack of exposure to information as related to diagnosis of: ? CVD ? DM  Nutrition Intervention ? Pt's individual nutrition plan reviewed with pt. ? Benefits of adopting Heart Healthy diet discussed when Medficts reviewed.  Nutrition Goal(s):  ? CBG concentrations in the normal range or as close to normal as is safely possible.  Plan:  Pt to attend nutrition classes ? Nutrition I - met 12/15/17 ? Nutrition II ? Portion Distortion ? Diabetes Blitz ? Diabetes Q & A Will provide client-centered nutrition education as part of interdisciplinary care.   Monitor and evaluate progress toward nutrition goal with team.  Derek Mound, M.Ed, RD, LDN, CDE 12/23/2017 10:13 AM

## 2017-12-24 NOTE — Progress Notes (Addendum)
Cardiac Individual Treatment Plan  Patient Details  Name: Carmen Anderson MRN: [id number] Date of Birth: 02/17/1961 Referring Provider:     CARDIAC REHAB PHASE II ORIENTATION from 11/12/2017 in [facility] CARDIAC REHAB  Referring Provider  Yates Decamp MD      Initial Encounter Date:    CARDIAC REHAB PHASE II ORIENTATION from 11/12/2017 in [facility] CARDIAC REHAB  Date  11/12/17  Referring Provider  Yates Decamp MD      Visit Diagnosis: Status post coronary artery stent placement  NSTEMI (non-ST elevated myocardial infarction) (HCC)  Patient's Home Medications on Admission:  Current Outpatient Medications:  .  aspirin EC 81 MG EC tablet, Take 1 tablet (81 mg total) by mouth daily., Disp: , Rfl:  .  atorvastatin (LIPITOR) 80 MG tablet, Take 1 tablet (80 mg total) by mouth daily at 6 PM., Disp: 90 tablet, Rfl: 3 .  empagliflozin (JARDIANCE) 10 MG TABS tablet, Take 10 mg by mouth daily., Disp: 90 mg, Rfl: 3 .  gabapentin (NEURONTIN) 100 MG capsule, Take 1 capsule (100 mg total) by mouth at bedtime., Disp: 90 capsule, Rfl: 3 .  levothyroxine (SYNTHROID, LEVOTHROID) 50 MCG tablet, Take 1 tablet (50 mcg total) by mouth daily., Disp: 90 tablet, Rfl: 3 .  lisinopril (PRINIVIL,ZESTRIL) 5 MG tablet, Take 1 tablet (5 mg total) by mouth daily., Disp: 90 tablet, Rfl: 3 .  metFORMIN (GLUCOPHAGE-XR) 500 MG 24 hr tablet, TAKE 1 TABLET BY MOUTH TWICE DAILY WITH MEALS AS DIRECTED, Disp: 180 tablet, Rfl: 3 .  nitroGLYCERIN (NITROSTAT) 0.4 MG SL tablet, Place 1 tablet (0.4 mg total) under the tongue every 5 (five) minutes x 3 doses as needed for chest pain., Disp: 25 tablet, Rfl: 3 .  ticagrelor (BRILINTA) 90 MG TABS tablet, Take 1 tablet (90 mg total) by mouth 2 (two) times daily., Disp: 180 tablet, Rfl: 3  Past Medical History: Past Medical History:  Diagnosis Date  . DM type 2 (diabetes mellitus, type 2) (HCC)   . History of hiatal hernia   . Hyperlipidemia   .  Hypertension   . Hypothyroidism     Tobacco Use: Social History   Tobacco Use  Smoking Status Former Smoker  . Packs/day: 0.50  . Years: 27.00  . Pack years: 13.50  . Types: Cigarettes  . Last attempt to quit: 09/05/2017  . Years since quitting: 0.3  Smokeless Tobacco Never Used    Labs: Recent Review Flowsheet Data    Labs for ITP Cardiac and Pulmonary Rehab Latest Ref Rng & Units 08/14/2017 09/05/2017 09/06/2017 10/20/2017 12/14/2017   Cholestrol 100 - 199 mg/dL - 956(O) - 130 -   LDLCALC 0 - 99 mg/dL - 865(H) - 52 -   HDL >84 mg/dL - 46 - 43 -   Trlycerides 0 - 149 mg/dL - 696 - 74 -   Hemoglobin A1c 4.0 - 5.6 % 7 - 6.9(H) - 6.6(A)      Capillary Blood Glucose: Lab Results  Component Value Date   GLUCAP 101 (H) 11/20/2017   GLUCAP 115 (H) 11/18/2017   GLUCAP 116 (H) 11/18/2017   GLUCAP 196 (H) 11/12/2017   GLUCAP 93 09/08/2017     Exercise Target Goals:    Exercise Program Goal: Individual exercise prescription set using results from initial 6 min walk test and THRR while considering  patient's activity barriers and safety.   Exercise Prescription Goal: Initial exercise prescription builds to 30-45 minutes a day of aerobic activity, 2-3  days per week.  Home exercise guidelines will be given to patient during program as part of exercise prescription that the participant will acknowledge.  Activity Barriers & Risk Stratification: Activity Barriers & Cardiac Risk Stratification - 11/12/17 0817      Activity Barriers & Cardiac Risk Stratification   Activity Barriers  Deconditioning;Shortness of Breath;Muscular Weakness;Other (comment)    Comments  B neuropathy    Cardiac Risk Stratification  High       6 Minute Walk: 6 Minute Walk    Row Name 11/12/17 1107         6 Minute Walk   Phase  Initial     Distance  1633 feet     Walk Time  6 minutes     # of Rest Breaks  0     MPH  3.1     METS  4.5     RPE  11     VO2 Peak  15.9     Symptoms  No     Resting  HR  69 bpm     Resting BP  106/56     Resting Oxygen Saturation   100 %     Exercise Oxygen Saturation  during 6 min walk  100 %     Max Ex. HR  95 bpm     Max Ex. BP  124/60     2 Minute Post BP  112/60        Oxygen Initial Assessment:   Oxygen Re-Evaluation:   Oxygen Discharge (Final Oxygen Re-Evaluation):   Initial Exercise Prescription: Initial Exercise Prescription - 11/12/17 1100      Date of Initial Exercise RX and Referring Provider   Date  11/12/17    Referring Provider  Yates Decamp MD      Treadmill   MPH  2.7    Grade  0    Minutes  10    METs  3.07      Bike   Level  0.7    Minutes  10    METs  3.64      NuStep   Level  3    SPM  80    Minutes  10    METs  3      Prescription Details   Frequency (times per week)  3    Duration  Progress to 30 minutes of continuous aerobic without signs/symptoms of physical distress      Intensity   THRR 40-80% of Max Heartrate  66-132    Ratings of Perceived Exertion  11-13    Perceived Dyspnea  0-4      Progression   Progression  Continue to progress workloads to maintain intensity without signs/symptoms of physical distress.      Resistance Training   Training Prescription  Yes    Weight  3lbs    Reps  10-15       Perform Capillary Blood Glucose checks as needed.  Exercise Prescription Changes: Exercise Prescription Changes    Row Name 11/18/17 1100 11/25/17 1000 12/14/17 0737 12/24/17 0700       Response to Exercise   Blood Pressure (Admit)  98/56  110/60  130/60  102/62    Blood Pressure (Exercise)  134/80  148/80  126/60  130/66    Blood Pressure (Exit)  112/60  112/60  106/58  100/60    Heart Rate (Admit)  72 bpm  85 bpm  68 bpm  63 bpm  Heart Rate (Exercise)  113 bpm  110 bpm  103 bpm  102 bpm    Heart Rate (Exit)  64 bpm  62 bpm  67 bpm  61 bpm    Rating of Perceived Exertion (Exercise)  13  9  14  14     Perceived Dyspnea (Exercise)  0  0  0  0    Symptoms  None  None   None  None     Comments  Pt oriented to exericse equipment  -  -  -    Duration  Progress to 30 minutes of  aerobic without signs/symptoms of physical distress  Continue with 30 min of aerobic exercise without signs/symptoms of physical distress.  Continue with 30 min of aerobic exercise without signs/symptoms of physical distress.  Progress to 45 minutes of aerobic exercise without signs/symptoms of physical distress    Intensity  THRR New  THRR unchanged  THRR unchanged  THRR unchanged      Progression   Progression  Continue to progress workloads to maintain intensity without signs/symptoms of physical distress.  Continue to progress workloads to maintain intensity without signs/symptoms of physical distress.  Continue to progress workloads to maintain intensity without signs/symptoms of physical distress.  Continue to progress workloads to maintain intensity without signs/symptoms of physical distress.    Average METs  2.83  3.71  3.9  4.03      Resistance Training   Training Prescription  No  No  Yes  No    Weight  -  -  3lbs  -    Reps  -  -  10-15  -    Time  -  -  10 Minutes  -      Interval Training   Interval Training  No  No  No  No      Treadmill   MPH  2.5  2.7  3.7  3.8    Grade  0  3  3  3     Minutes  10  10  10  10     METs  2.91  3.81  4  4.3      Bike   Level  0.7  0.7  0.7  0.7    Minutes  10  10  10  10     METs  3.62  3.61  3.55  3.59      NuStep   Level  2  4  4  5     SPM  85  85  85  95    Minutes  10  10  10  10     METs  2.5  3.7  4  4.2      Home Exercise Plan   Plans to continue exercise at  -  Home (comment) [employer]  Home (comment) Home Treadmill   Home (comment) Home Treadmill     Frequency  -  Add 2 additional days to program exercise sessions.  Add 2 additional days to program exercise sessions.  Add 2 additional days to program exercise sessions.    Initial Home Exercises Provided  -  11/25/17  11/25/17  11/25/17       Exercise Comments: Exercise  Comments    Row Name 11/18/17 1119 11/25/17 1027 11/25/17 1029 12/24/17 0719     Exercise Comments  Pt's first day of exercise. Pt off to good start with exercise prescription. Pt oriented to exercise equipment. Will continue to monitor  Reviewed METs,  goals and HEP with pt. Pt is responding well to her exercise prescription.   Reviewed METs, goals and HEP with pt. Pt is responding well to her exercise prescription. Pt is currently walking 2 days at a local school track. Will continue to monitor and follow up with pt.   Reviewed METs and goals with pt. Pt has now purchased a home treadmill. Pt is consistently exercising at home and is excited about new treadmill. Will continue to monitor and progress pt as tolerated.        Exercise Goals and Review: Exercise Goals    Row Name 11/12/17 0827             Exercise Goals   Increase Physical Activity  Yes       Intervention  Provide advice, education, support and counseling about physical activity/exercise needs.;Develop an individualized exercise prescription for aerobic and resistive training based on initial evaluation findings, risk stratification, comorbidities and participant's personal goals.       Expected Outcomes  Short Term: Attend rehab on a regular basis to increase amount of physical activity.;Long Term: Exercising regularly at least 3-5 days a week.;Long Term: Add in home exercise to make exercise part of routine and to increase amount of physical activity.       Increase Strength and Stamina  Yes increase walking tolerance; Increase tone in mid section       Intervention  Provide advice, education, support and counseling about physical activity/exercise needs.;Develop an individualized exercise prescription for aerobic and resistive training based on initial evaluation findings, risk stratification, comorbidities and participant's personal goals.       Expected Outcomes  Long Term: Improve cardiorespiratory fitness, muscular endurance  and strength as measured by increased METs and functional capacity ( );Short Term: Perform resistance training exercises routinely during rehab and add in resistance training at home;Short Term: Increase workloads from initial exercise prescription for resistance, speed, and METs.       Able to understand and use rate of perceived exertion (RPE) scale  Yes       Intervention  Provide education and explanation on how to use RPE scale       Expected Outcomes  Short Term: Able to use RPE daily in rehab to express subjective intensity level;Long Term:  Able to use RPE to guide intensity level when exercising independently       Knowledge and understanding of Target Heart Rate Range (THRR)  Yes       Intervention  Provide education and explanation of THRR including how the numbers were predicted and where they are located for reference       Expected Outcomes  Short Term: Able to state/look up THRR;Long Term: Able to use THRR to govern intensity when exercising independently;Short Term: Able to use daily as guideline for intensity in rehab       Able to check pulse independently  Yes       Intervention  Provide education and demonstration on how to check pulse in carotid and radial arteries.;Review the importance of being able to check your own pulse for safety during independent exercise       Expected Outcomes  Short Term: Able to explain why pulse checking is important during independent exercise;Long Term: Able to check pulse independently and accurately       Understanding of Exercise Prescription  Yes       Intervention  Provide education, explanation, and written materials on patient's individual exercise prescription  Expected Outcomes  Short Term: Able to explain program exercise prescription;Long Term: Able to explain home exercise prescription to exercise independently          Exercise Goals Re-Evaluation : Exercise Goals Re-Evaluation    Row Name 11/25/17 1029 12/24/17 0724            Exercise Goal Re-Evaluation   Exercise Goals Review  Increase Physical Activity;Able to understand and use rate of perceived exertion (RPE) scale;Knowledge and understanding of Target Heart Rate Range (THRR);Understanding of Exercise Prescription;Increase Strength and Stamina;Able to check pulse independently  Understanding of Exercise Prescription;Increase Physical Activity      Comments  Reviewed HEP with pt. Also Reviewed pt RPE Scale, THRR, weather conditions, NTG use, endpoints of exercise, warmup and cool down.   Pt is responding well to workload increases. Pt is very motivated with exercise. Pt states she is feeling stronger and is starting to influence husband to exercise with her.       Expected Outcomes  Pt will continue to walk 2-3 additional days for 30-45 minutes. Pt will continue to increase cardiovascular and muscular strength. Will continue to monitor and progress pt as tolerated.   Pt will continue to walk at home treadmill for 30-45 minutes 2-3 days a week. Pt will continue to increase strength and stamina. Will continue to monitor.           Discharge Exercise Prescription (Final Exercise Prescription Changes): Exercise Prescription Changes - 12/24/17 0700      Response to Exercise   Blood Pressure (Admit)  102/62    Blood Pressure (Exercise)  130/66    Blood Pressure (Exit)  100/60    Heart Rate (Admit)  63 bpm    Heart Rate (Exercise)  102 bpm    Heart Rate (Exit)  61 bpm    Rating of Perceived Exertion (Exercise)  14    Perceived Dyspnea (Exercise)  0    Symptoms  None    Duration  Progress to 45 minutes of aerobic exercise without signs/symptoms of physical distress    Intensity  THRR unchanged      Progression   Progression  Continue to progress workloads to maintain intensity without signs/symptoms of physical distress.    Average METs  4.03      Resistance Training   Training Prescription  No      Interval Training   Interval Training  No       Treadmill   MPH  3.8    Grade  3    Minutes  10    METs  4.3      Bike   Level  0.7    Minutes  10    METs  3.59      NuStep   Level  5    SPM  95    Minutes  10    METs  4.2      Home Exercise Plan   Plans to continue exercise at  Home (comment) Home Treadmill     Frequency  Add 2 additional days to program exercise sessions.    Initial Home Exercises Provided  11/25/17       Nutrition:  Target Goals: Understanding of nutrition guidelines, daily intake of sodium 1500mg , cholesterol 200mg , calories 30% from fat and 7% or less from saturated fats, daily to have 5 or more servings of fruits and vegetables.  Biometrics: Pre Biometrics - 11/12/17 1132      Pre Biometrics   % Body  Fat  18.3 %        Nutrition Therapy Plan and Nutrition Goals: Nutrition Therapy & Goals - 11/12/17 0927      Nutrition Therapy   Diet  Consistent Carb, Heart Healthy      Personal Nutrition Goals   Nutrition Goal  CBG concentrations in the normal range or as close to normal as is safely possible.      Intervention Plan   Intervention  Prescribe, educate and counsel regarding individualized specific dietary modifications aiming towards targeted core components such as weight, hypertension, lipid management, diabetes, heart failure and other comorbidities.    Expected Outcomes  Short Term Goal: Understand basic principles of dietary content, such as calories, fat, sodium, cholesterol and nutrients.;Long Term Goal: Adherence to prescribed nutrition plan.       Nutrition Assessments: Nutrition Assessments - 11/12/17 0927      MEDFICTS Scores   Pre Score  6       Nutrition Goals Re-Evaluation:   Nutrition Goals Re-Evaluation:   Nutrition Goals Discharge (Final Nutrition Goals Re-Evaluation):   Psychosocial: Target Goals: Acknowledge presence or absence of significant depression and/or stress, maximize coping skills, provide positive support system. Participant is able to  verbalize types and ability to use techniques and skills needed for reducing stress and depression.  Initial Review & Psychosocial Screening: Initial Psych Review & Screening - 11/12/17 1132      Initial Review   Current issues with  Current Stress Concerns    Source of Stress Concerns  Occupation      Family Dynamics   Good Support System?  Yes Pt reports her spouse and sisters as her support system.       Barriers   Psychosocial barriers to participate in program  The patient should benefit from training in stress management and relaxation.      Screening Interventions   Interventions  Encouraged to exercise    Expected Outcomes  Long Term Goal: Stressors or current issues are controlled or eliminated. Pt will continue to exhibit a positive outlook utilizing good coping skills.        Quality of Life Scores: Quality of Life - 11/12/17 1134      Quality of Life Scores   Health/Function Pre  28 %    Socioeconomic Pre  26.57 %    Psych/Spiritual Pre  29.14 %    Family Pre  30 %    GLOBAL Pre  28.18 %      Scores of 19 and below usually indicate a poorer quality of life in these areas.  A difference of  2-3 points is a clinically meaningful difference.  A difference of 2-3 points in the total score of the Quality of Life Index has been associated with significant improvement in overall quality of life, self-image, physical symptoms, and general health in studies assessing change in quality of life.  PHQ-9: Recent Review Flowsheet Data    Depression screen [facility] 2/9 11/18/2017 11/12/2017 10/07/2017 09/12/2017 01/02/2017   Decreased Interest 0 0 0 0 0   Down, Depressed, Hopeless 0 0 0 0 0   PHQ - 2 Score 0 0 0 0 0     Interpretation of Total Score  Total Score Depression Severity:  1-4 = Minimal depression, 5-9 = Mild depression, 10-14 = Moderate depression, 15-19 = Moderately severe depression, 20-27 = Severe depression   Psychosocial Evaluation and Intervention: Psychosocial  Evaluation - 11/18/17 1622      Psychosocial Evaluation & Interventions  Interventions  Encouraged to exercise with the program and follow exercise prescription;Stress management education;Relaxation education    Comments  No psychosocial needs identified. No intervention necessary.    Expected Outcomes  Pt will exhibit positive outlook utilizing good coping skills.    Continue Psychosocial Services   No Follow up required       Psychosocial Re-Evaluation: Psychosocial Re-Evaluation    Row Name 11/25/17 1340 12/18/17 1043           Psychosocial Re-Evaluation   Current issues with  None Identified  None Identified      Comments  No psychosocial needs identified.  No interventions necessary.  No psychosocial needs identified.  No interventions necessary.      Expected Outcomes  Pt will continue to exhibit a positve outlook utilizing good coping skills.  Pt will continue to exhibit a positve outlook utilizing good coping skills.      Interventions  Encouraged to attend Cardiac Rehabilitation for the exercise  Encouraged to attend Cardiac Rehabilitation for the exercise      Continue Psychosocial Services   No Follow up required  No Follow up required         Psychosocial Discharge (Final Psychosocial Re-Evaluation): Psychosocial Re-Evaluation - 12/18/17 1043      Psychosocial Re-Evaluation   Current issues with  None Identified    Comments  No psychosocial needs identified.  No interventions necessary.    Expected Outcomes  Pt will continue to exhibit a positve outlook utilizing good coping skills.    Interventions  Encouraged to attend Cardiac Rehabilitation for the exercise    Continue Psychosocial Services   No Follow up required       Vocational Rehabilitation: Provide vocational rehab assistance to qualifying candidates.   Vocational Rehab Evaluation & Intervention: Vocational Rehab - 11/12/17 1131      Initial Vocational Rehab Evaluation & Intervention   Assessment  shows need for Vocational Rehabilitation  No       Education: Education Goals: Education classes will be provided on a weekly basis, covering required topics. Participant will state understanding/return demonstration of topics presented.  Learning Barriers/Preferences: Learning Barriers/Preferences - 11/12/17 0827      Learning Barriers/Preferences   Learning Barriers  Sight;Hearing    Learning Preferences  Skilled Demonstration       Education Topics: Count Your Pulse:  -Group instruction provided by verbal instruction, demonstration, patient participation and written materials to support subject.  Instructors address importance of being able to find your pulse and how to count your pulse when at home without a heart monitor.  Patients get hands on experience counting their pulse with staff help and individually.   Heart Attack, Angina, and Risk Factor Modification:  -Group instruction provided by verbal instruction, video, and written materials to support subject.  Instructors address signs and symptoms of angina and heart attacks.    Also discuss risk factors for heart disease and how to make changes to improve heart health risk factors.   Functional Fitness:  -Group instruction provided by verbal instruction, demonstration, patient participation, and written materials to support subject.  Instructors address safety measures for doing things around the house.  Discuss how to get up and down off the floor, how to pick things up properly, how to safely get out of a chair without assistance, and balance training.   Meditation and Mindfulness:  -Group instruction provided by verbal instruction, patient participation, and written materials to support subject.  Instructor addresses importance of mindfulness and  meditation practice to help reduce stress and improve awareness.  Instructor also leads participants through a meditation exercise.    Stretching for Flexibility and Mobility:   -Group instruction provided by verbal instruction, patient participation, and written materials to support subject.  Instructors lead participants through series of stretches that are designed to increase flexibility thus improving mobility.  These stretches are additional exercise for major muscle groups that are typically performed during regular warm up and cool down.   Hands Only CPR:  -Group verbal, video, and participation provides a basic overview of AHA guidelines for community CPR. Role-play of emergencies allow participants the opportunity to practice calling for help and chest compression technique with discussion of AED use.   Hypertension: -Group verbal and written instruction that provides a basic overview of hypertension including the most recent diagnostic guidelines, risk factor reduction with self-care instructions and medication management.    Nutrition I class: Heart Healthy Eating:  -Group instruction provided by PowerPoint slides, verbal discussion, and written materials to support subject matter. The instructor gives an explanation and review of the Therapeutic Lifestyle Changes diet recommendations, which includes a discussion on lipid goals, dietary fat, sodium, fiber, plant stanol/sterol esters, sugar, and the components of a well-balanced, healthy diet.   CARDIAC REHAB PHASE II EXERCISE from 12/23/2017 in [facility] CARDIAC REHAB  Date  12/15/17  Educator  RD  Instruction Review Code  2- Demonstrated Understanding      Nutrition II class: Lifestyle Skills:  -Group instruction provided by PowerPoint slides, verbal discussion, and written materials to support subject matter. The instructor gives an explanation and review of label reading, grocery shopping for heart health, heart healthy recipe modifications, and ways to make healthier choices when eating out.   CARDIAC REHAB PHASE II EXERCISE from 12/23/2017 in [facility] CARDIAC  REHAB  Date  12/23/17  Educator  RD      Diabetes Question & Answer:  -Group instruction provided by PowerPoint slides, verbal discussion, and written materials to support subject matter. The instructor gives an explanation and review of diabetes co-morbidities, pre- and post-prandial blood glucose goals, pre-exercise blood glucose goals, signs, symptoms, and treatment of hypoglycemia and hyperglycemia, and foot care basics.   Diabetes Blitz:  -Group instruction provided by PowerPoint slides, verbal discussion, and written materials to support subject matter. The instructor gives an explanation and review of the physiology behind type 1 and type 2 diabetes, diabetes medications and rational behind using different medications, pre- and post-prandial blood glucose recommendations and Hemoglobin A1c goals, diabetes diet, and exercise including blood glucose guidelines for exercising safely.    CARDIAC REHAB PHASE II EXERCISE from 12/23/2017 in [facility] CARDIAC REHAB  Date  12/23/17  Educator  RD      Portion Distortion:  -Group instruction provided by PowerPoint slides, verbal discussion, written materials, and food models to support subject matter. The instructor gives an explanation of serving size versus portion size, changes in portions sizes over the last 20 years, and what consists of a serving from each food group.   Stress Management:  -Group instruction provided by verbal instruction, video, and written materials to support subject matter.  Instructors review role of stress in heart disease and how to cope with stress positively.     Exercising on Your Own:  -Group instruction provided by verbal instruction, power point, and written materials to support subject.  Instructors discuss benefits of exercise, components of exercise, frequency and intensity of exercise,  and end points for exercise.  Also discuss use of nitroglycerin and activating EMS.  Review options of  places to exercise outside of rehab.  Review guidelines for sex with heart disease.   Cardiac Drugs I:  -Group instruction provided by verbal instruction and written materials to support subject.  Instructor reviews cardiac drug classes: antiplatelets, anticoagulants, beta blockers, and statins.  Instructor discusses reasons, side effects, and lifestyle considerations for each drug class.   Cardiac Drugs II:  -Group instruction provided by verbal instruction and written materials to support subject.  Instructor reviews cardiac drug classes: angiotensin converting enzyme inhibitors (ACE-I), angiotensin II receptor blockers (ARBs), nitrates, and calcium channel blockers.  Instructor discusses reasons, side effects, and lifestyle considerations for each drug class.   Anatomy and Physiology of the Circulatory System:  Group verbal and written instruction and models provide basic cardiac anatomy and physiology, with the coronary electrical and arterial systems. Review of: AMI, Angina, Valve disease, Heart Failure, Peripheral Artery Disease, Cardiac Arrhythmia, Pacemakers, and the ICD.   Other Education:  -Group or individual verbal, written, or video instructions that support the educational goals of the cardiac rehab program.   Holiday Eating Survival Tips:  -Group instruction provided by PowerPoint slides, verbal discussion, and written materials to support subject matter. The instructor gives patients tips, tricks, and techniques to help them not only survive but enjoy the holidays despite the onslaught of food that accompanies the holidays.   Knowledge Questionnaire Score: Knowledge Questionnaire Score - 11/12/17 1107      Knowledge Questionnaire Score   Pre Score  22/24       Core Components/Risk Factors/Patient Goals at Admission: Personal Goals and Risk Factors at Admission - 11/12/17 1139      Core Components/Risk Factors/Patient Goals on Admission    Weight Management   Yes;Weight Maintenance    Intervention  Weight Management: Develop a combined nutrition and exercise program designed to reach desired caloric intake, while maintaining appropriate intake of nutrient and fiber, sodium and fats, and appropriate energy expenditure required for the weight goal.;Weight Management: Provide education and appropriate resources to help participant work on and attain dietary goals.;Weight Management/Obesity: Establish reasonable short term and long term weight goals.    Expected Outcomes  Short Term: Continue to assess and modify interventions until short term weight is achieved;Long Term: Adherence to nutrition and physical activity/exercise program aimed toward attainment of established weight goal;Weight Maintenance: Understanding of the daily nutrition guidelines, which includes 25-35% calories from fat, 7% or less cal from saturated fats, less than 200mg  cholesterol, less than 1.5gm of sodium, & 5 or more servings of fruits and vegetables daily;Weight Loss: Understanding of general recommendations for a balanced deficit meal plan, which promotes 1-2 lb weight loss per week and includes a negative energy balance of [id number] kcal/d;Understanding recommendations for meals to include 15-35% energy as protein, 25-35% energy from fat, 35-60% energy from carbohydrates, less than 200mg  of dietary cholesterol, 20-35 gm of total fiber daily;Understanding of distribution of calorie intake throughout the day with the consumption of 4-5 meals/snacks    Diabetes  Yes    Intervention  Provide education about signs/symptoms and action to take for hypo/hyperglycemia.;Provide education about proper nutrition, including hydration, and aerobic/resistive exercise prescription along with prescribed medications to achieve blood glucose in normal ranges: Fasting glucose 65-99 mg/dL    Expected Outcomes  Short Term: Participant verbalizes understanding of the signs/symptoms and immediate care of  hyper/hypoglycemia, proper foot care and importance of medication, aerobic/resistive exercise and  nutrition plan for blood glucose control.;Long Term: Attainment of HbA1C < 7%.    Hypertension  Yes    Intervention  Provide education on lifestyle modifcations including regular physical activity/exercise, weight management, moderate sodium restriction and increased consumption of fresh fruit, vegetables, and low fat dairy, alcohol moderation, and smoking cessation.;Monitor prescription use compliance.    Expected Outcomes  Short Term: Continued assessment and intervention until BP is < 140/56mm HG in hypertensive participants. < 130/50mm HG in hypertensive participants with diabetes, heart failure or chronic kidney disease.;Long Term: Maintenance of blood pressure at goal levels.    Lipids  Yes    Intervention  Provide education and support for participant on nutrition & aerobic/resistive exercise along with prescribed medications to achieve LDL 70mg , HDL >40mg .    Expected Outcomes  Short Term: Participant states understanding of desired cholesterol values and is compliant with medications prescribed. Participant is following exercise prescription and nutrition guidelines.;Long Term: Cholesterol controlled with medications as prescribed, with individualized exercise RX and with personalized nutrition plan. Value goals: LDL < 70mg , HDL > 40 mg.    Stress  Yes    Intervention  Offer individual and/or small group education and counseling on adjustment to heart disease, stress management and health-related lifestyle change. Teach and support self-help strategies.;Refer participants experiencing significant psychosocial distress to appropriate mental health specialists for further evaluation and treatment. When possible, include family members and significant others in education/counseling sessions.    Expected Outcomes  Short Term: Participant demonstrates changes in health-related behavior, relaxation and  other stress management skills, ability to obtain effective social support, and compliance with psychotropic medications if prescribed.;Long Term: Emotional wellbeing is indicated by absence of clinically significant psychosocial distress or social isolation.       Core Components/Risk Factors/Patient Goals Review:  Goals and Risk Factor Review    Row Name 11/18/17 1618 12/18/17 1042           Core Components/Risk Factors/Patient Goals Review   Personal Goals Review  Weight Management/Obesity;Diabetes;Lipids;Hypertension;Stress  Weight Management/Obesity;Diabetes;Lipids;Hypertension;Stress      Review  Pt with multiple CAD RF eager to participate in CR Program.   Carmen Anderson is continuing to participate in CR exercise.  She feels that she is increasing the strength in her arms and legs.       Expected Outcomes  Pt with multiple CAD RF will continue to participate in CR exercise, diet, and lifestyle modification to reduce CV RF.   Pt with multiple CAD RF will continue to participate in CR exercise, diet, and lifestyle modification to reduce CV RF.          Core Components/Risk Factors/Patient Goals at Discharge (Final Review):  Goals and Risk Factor Review - 12/18/17 1042      Core Components/Risk Factors/Patient Goals Review   Personal Goals Review  Weight Management/Obesity;Diabetes;Lipids;Hypertension;Stress    Review  Carmen Anderson is continuing to participate in CR exercise.  She feels that she is increasing the strength in her arms and legs.     Expected Outcomes  Pt with multiple CAD RF will continue to participate in CR exercise, diet, and lifestyle modification to reduce CV RF.        ITP Comments: ITP Comments    Row Name 11/12/17 0815 11/25/17 1339 12/18/17 1041       ITP Comments  Dr. Armanda Magic, Medical Director  30 Day ITP Review; Pt recently started cardiac rehab and is off to a great start.  Carmen Anderson is tolerating exercise well.  30 Day  ITP Review.  Albin FellingCarla is tolerating exercise well.   She has great attendance.         Comments: See ITP Comments

## 2017-12-25 ENCOUNTER — Encounter (HOSPITAL_COMMUNITY)
Admission: RE | Admit: 2017-12-25 | Discharge: 2017-12-25 | Disposition: A | Discharge status: Home / Self Care | Payer: BLUE CROSS/BLUE SHIELD | Source: Ambulatory Visit | Attending: Cardiology | Admitting: Cardiology

## 2017-12-25 ENCOUNTER — Encounter (HOSPITAL_COMMUNITY): Payer: BLUE CROSS/BLUE SHIELD

## 2017-12-25 DIAGNOSIS — I214 Non-ST elevation (NSTEMI) myocardial infarction: Secondary | ICD-10-CM

## 2017-12-25 DIAGNOSIS — Z955 Presence of coronary angioplasty implant and graft: Secondary | ICD-10-CM

## 2017-12-25 DIAGNOSIS — Z48812 Encounter for surgical aftercare following surgery on the circulatory system: Secondary | ICD-10-CM | POA: Diagnosis not present

## 2017-12-25 NOTE — Telephone Encounter (Signed)
Left message to call back. x3

## 2017-12-28 ENCOUNTER — Encounter (HOSPITAL_COMMUNITY): Payer: BLUE CROSS/BLUE SHIELD

## 2017-12-28 ENCOUNTER — Encounter (HOSPITAL_COMMUNITY)
Admission: RE | Admit: 2017-12-28 | Discharge: 2017-12-28 | Disposition: A | Discharge status: Home / Self Care | Payer: BLUE CROSS/BLUE SHIELD | Source: Ambulatory Visit | Attending: Cardiology | Admitting: Cardiology

## 2017-12-28 DIAGNOSIS — Z955 Presence of coronary angioplasty implant and graft: Secondary | ICD-10-CM

## 2017-12-28 DIAGNOSIS — I214 Non-ST elevation (NSTEMI) myocardial infarction: Secondary | ICD-10-CM

## 2017-12-30 ENCOUNTER — Encounter (HOSPITAL_COMMUNITY): Payer: BLUE CROSS/BLUE SHIELD

## 2018-01-01 ENCOUNTER — Encounter (HOSPITAL_COMMUNITY): Payer: BLUE CROSS/BLUE SHIELD

## 2018-01-01 ENCOUNTER — Encounter (HOSPITAL_COMMUNITY)
Admission: RE | Admit: 2018-01-01 | Discharge: 2018-01-01 | Disposition: A | Discharge status: Home / Self Care | Payer: BLUE CROSS/BLUE SHIELD | Source: Ambulatory Visit | Attending: Cardiology | Admitting: Cardiology

## 2018-01-01 DIAGNOSIS — Z955 Presence of coronary angioplasty implant and graft: Secondary | ICD-10-CM | POA: Diagnosis not present

## 2018-01-01 DIAGNOSIS — I214 Non-ST elevation (NSTEMI) myocardial infarction: Secondary | ICD-10-CM

## 2018-01-01 DIAGNOSIS — Z48812 Encounter for surgical aftercare following surgery on the circulatory system: Secondary | ICD-10-CM | POA: Diagnosis not present

## 2018-01-03 ENCOUNTER — Other Ambulatory Visit: Payer: Self-pay | Admitting: Family Medicine

## 2018-01-03 DIAGNOSIS — E034 Atrophy of thyroid (acquired): Secondary | ICD-10-CM

## 2018-01-04 ENCOUNTER — Encounter (HOSPITAL_COMMUNITY)
Admission: RE | Admit: 2018-01-04 | Discharge: 2018-01-04 | Disposition: A | Discharge status: Home / Self Care | Payer: BLUE CROSS/BLUE SHIELD | Source: Ambulatory Visit | Attending: Cardiology | Admitting: Cardiology

## 2018-01-04 ENCOUNTER — Encounter (HOSPITAL_COMMUNITY): Payer: BLUE CROSS/BLUE SHIELD

## 2018-01-04 DIAGNOSIS — I214 Non-ST elevation (NSTEMI) myocardial infarction: Secondary | ICD-10-CM

## 2018-01-04 DIAGNOSIS — Z955 Presence of coronary angioplasty implant and graft: Secondary | ICD-10-CM

## 2018-01-04 DIAGNOSIS — Z48812 Encounter for surgical aftercare following surgery on the circulatory system: Secondary | ICD-10-CM | POA: Diagnosis not present

## 2018-01-04 NOTE — Telephone Encounter (Signed)
Patient called, left VM on home and cell phone to call the office and schedule a yearly physical, last physical 01/08/17.  Gabapentin and Levothyroxine refill Last OV:01/02/17 Last refill:01/02/17 90/3 refills for both ZOX:[id number]:[facility]: [id number] [place], [place] - [address] [phone] (Phone) [phone] (Fax)

## 2018-01-05 NOTE — Telephone Encounter (Signed)
Please advise.  Pt has no appt scheduled.  Last ov 10/07/17 for myocardial infarction, hypertension and hyperlipidemia.  Dgaddy, CMA

## 2018-01-06 ENCOUNTER — Encounter (HOSPITAL_COMMUNITY): Payer: BLUE CROSS/BLUE SHIELD

## 2018-01-06 ENCOUNTER — Encounter (HOSPITAL_COMMUNITY)
Admission: RE | Admit: 2018-01-06 | Discharge: 2018-01-06 | Disposition: A | Discharge status: Home / Self Care | Payer: BLUE CROSS/BLUE SHIELD | Source: Ambulatory Visit | Attending: Cardiology | Admitting: Cardiology

## 2018-01-06 DIAGNOSIS — Z955 Presence of coronary angioplasty implant and graft: Secondary | ICD-10-CM

## 2018-01-06 DIAGNOSIS — Z48812 Encounter for surgical aftercare following surgery on the circulatory system: Secondary | ICD-10-CM | POA: Diagnosis not present

## 2018-01-06 DIAGNOSIS — I214 Non-ST elevation (NSTEMI) myocardial infarction: Secondary | ICD-10-CM

## 2018-01-07 ENCOUNTER — Ambulatory Visit: Payer: BLUE CROSS/BLUE SHIELD | Admitting: Family Medicine

## 2018-01-08 ENCOUNTER — Encounter (HOSPITAL_COMMUNITY): Payer: BLUE CROSS/BLUE SHIELD

## 2018-01-08 ENCOUNTER — Encounter (HOSPITAL_COMMUNITY)
Admission: RE | Admit: 2018-01-08 | Discharge: 2018-01-08 | Disposition: A | Discharge status: Home / Self Care | Payer: BLUE CROSS/BLUE SHIELD | Source: Ambulatory Visit | Attending: Cardiology | Admitting: Cardiology

## 2018-01-08 DIAGNOSIS — Z955 Presence of coronary angioplasty implant and graft: Secondary | ICD-10-CM

## 2018-01-08 DIAGNOSIS — I214 Non-ST elevation (NSTEMI) myocardial infarction: Secondary | ICD-10-CM | POA: Diagnosis not present

## 2018-01-08 DIAGNOSIS — Z48812 Encounter for surgical aftercare following surgery on the circulatory system: Secondary | ICD-10-CM | POA: Diagnosis not present

## 2018-01-11 ENCOUNTER — Encounter: Payer: Self-pay | Admitting: Family Medicine

## 2018-01-11 ENCOUNTER — Encounter (HOSPITAL_COMMUNITY): Payer: BLUE CROSS/BLUE SHIELD

## 2018-01-11 ENCOUNTER — Ambulatory Visit (INDEPENDENT_AMBULATORY_CARE_PROVIDER_SITE_OTHER): Payer: BLUE CROSS/BLUE SHIELD | Admitting: Family Medicine

## 2018-01-11 ENCOUNTER — Encounter (HOSPITAL_COMMUNITY)
Admission: RE | Admit: 2018-01-11 | Discharge: 2018-01-11 | Disposition: A | Discharge status: Home / Self Care | Payer: BLUE CROSS/BLUE SHIELD | Source: Ambulatory Visit | Attending: Cardiology | Admitting: Cardiology

## 2018-01-11 ENCOUNTER — Other Ambulatory Visit: Payer: Self-pay

## 2018-01-11 VITALS — BP 110/70 | HR 80 | Temp 98.6°F | Resp 16 | Ht 63.5 in | Wt 110.6 lb

## 2018-01-11 DIAGNOSIS — I214 Non-ST elevation (NSTEMI) myocardial infarction: Secondary | ICD-10-CM | POA: Insufficient documentation

## 2018-01-11 DIAGNOSIS — Z955 Presence of coronary angioplasty implant and graft: Secondary | ICD-10-CM | POA: Diagnosis not present

## 2018-01-11 DIAGNOSIS — Z48812 Encounter for surgical aftercare following surgery on the circulatory system: Secondary | ICD-10-CM | POA: Insufficient documentation

## 2018-01-11 DIAGNOSIS — Z87891 Personal history of nicotine dependence: Secondary | ICD-10-CM

## 2018-01-11 DIAGNOSIS — R059 Cough, unspecified: Secondary | ICD-10-CM

## 2018-01-11 DIAGNOSIS — E1159 Type 2 diabetes mellitus with other circulatory complications: Secondary | ICD-10-CM | POA: Diagnosis not present

## 2018-01-11 DIAGNOSIS — R05 Cough: Secondary | ICD-10-CM | POA: Diagnosis not present

## 2018-01-11 MED ORDER — FLUTICASONE PROPIONATE 50 MCG/ACT NA SUSP: 2.0000 | Freq: Every day | NASAL | 6 refills | Status: DC

## 2018-01-11 NOTE — Patient Instructions (Signed)
     IF you received an x-ray today, you will receive an invoice from [facility]. Please contact [facility] at [phone] with questions or concerns regarding your invoice.   IF you received labwork today, you will receive an invoice from [facility]. Please contact [facility] at [phone] with questions or concerns regarding your invoice.   Our billing staff will not be able to assist you with questions regarding bills from these companies.  You will be contacted with the lab results as soon as they are available. The fastest way to get your results is to activate your My Chart account. Instructions are located on the last page of this paperwork. If you have not heard from us regarding the results in 2 weeks, please contact this office.     

## 2018-01-11 NOTE — Progress Notes (Signed)
Chief Complaint  Patient presents with  . Nicotine Dependence    f/u  . cough and congestion    x yesterday    HPI  CAD and tobacco use She reports that she has continued to abstain from smoking since the day of her heart attack 09/04/2017 She states that she is on the cardiac rehab and reports that she would not go back to smoking  Her husband quit smoking with her. She stopped smoking nicotine gum 3 weeks ago. She chews a minty gum when she gets cravings.  She is taking brilinta, jardiance, aspirin, metformin She has easy bruising from aspirin  Well Controlled Diabetes She is exercising in cardiac rehab She bought a treadmill and a stepper for exercise at home She is sticking to an ADA diet and exercising She denies polyuria, polydipsia, polyphagia, no numbness or tingling Component     Latest Ref Rng & Units 03/04/2016 06/03/2016 11/25/2016 09/06/2017  Hemoglobin A1C     4.0 - 5.6 % 7.0 7.0 6.7 6.9 (H)  Mean Plasma Glucose     mg/dL    161   Component     Latest Ref Rng & Units 12/14/2017  Hemoglobin A1C     4.0 - 5.6 % 6.6 (A)  Mean Plasma Glucose     mg/dL    Cough She started coughing yesterday with scant  Mucus and congestion  She denies fevers, chills or shortness of breath She reports that she always feels cold She gets headaches when she coughs She took 2 tylenol which did not help the headache She reports that she usually has a summer time cold     Past Medical History:  Diagnosis Date  . DM type 2 (diabetes mellitus, type 2) (HCC)   . History of hiatal hernia   . Hyperlipidemia   . Hypertension   . Hypothyroidism     Current Outpatient Medications  Medication Sig Dispense Refill  . aspirin EC 81 MG EC tablet Take 1 tablet (81 mg total) by mouth daily.    [name] atorvastatin (LIPITOR) 80 MG tablet Take 1 tablet (80 mg total) by mouth daily at 6 PM. 90 tablet 3  . empagliflozin (JARDIANCE) 10 MG TABS tablet Take 10 mg by mouth daily. 90 mg 3  . gabapentin  (NEURONTIN) 100 MG capsule TAKE 1 CAPSULE (100 MG TOTAL) BY MOUTH AT BEDTIME. 90 capsule 3  . levothyroxine (SYNTHROID, LEVOTHROID) 50 MCG tablet TAKE 1 TABLET BY MOUTH EVERY DAY 90 tablet 3  . metFORMIN (GLUCOPHAGE-XR) 500 MG 24 hr tablet TAKE 1 TABLET BY MOUTH TWICE DAILY WITH MEALS AS DIRECTED 180 tablet 3  . nitroGLYCERIN (NITROSTAT) 0.4 MG SL tablet Place 1 tablet (0.4 mg total) under the tongue every 5 (five) minutes x 3 doses as needed for chest pain. 25 tablet 3  . ticagrelor (BRILINTA) 90 MG TABS tablet Take 1 tablet (90 mg total) by mouth 2 (two) times daily. 180 tablet 3  . lisinopril (PRINIVIL,ZESTRIL) 5 MG tablet Take 1 tablet (5 mg total) by mouth daily. 90 tablet 3   No current facility-administered medications for this visit.     Allergies:  Allergies  Allergen Reactions  . Aspirin Other (See Comments)    Burns stomach  . Codeine Rash    Past Surgical History:  Procedure Laterality Date  . ABDOMINAL HYSTERECTOMY  1995  . CARDIAC CATHETERIZATION    . CORONARY STENT INTERVENTION N/A 09/07/2017   Procedure: CORONARY STENT INTERVENTION;  Surgeon: Runell Gess,  MD;  Location: [facility];  Service: Cardiovascular;  Laterality: N/A;  . LEFT HEART CATH AND CORONARY ANGIOGRAPHY N/A 09/07/2017   Procedure: LEFT HEART CATH AND CORONARY ANGIOGRAPHY;  Surgeon: Runell Gess, MD;  Location: [facility]VAS[facility] CV [facility];  Service: Cardiovascular;  Laterality: N/A;  . STAPEDECTOMY Right ~ 2002    Social History   Socioeconomic History  . Marital status: Married    Spouse name: Not on file  . Number of children: Not on file  . Years of education: 57  . Highest education level: Not on file  Occupational History  . Occupation: property asst. Event organiser: [employer]  Social Needs  . Financial resource strain: Not on file  . Food insecurity:    Worry: Not on file    Inability: Not on file  . Transportation needs:    Medical: Not on file     Non-medical: Not on file  Tobacco Use  . Smoking status: Former Smoker    Packs/day: 0.50    Years: 27.00    Pack years: 13.50    Types: Cigarettes    Last attempt to quit: 09/05/2017    Years since quitting: 0.3  . Smokeless tobacco: Never Used  Substance and Sexual Activity  . Alcohol use: No  . Drug use: No  . Sexual activity: Yes    Birth control/protection: None  Lifestyle  . Physical activity:    Days per week: Not on file    Minutes per session: Not on file  . Stress: Not on file  Relationships  . Social connections:    Talks on phone: Not on file    Gets together: Not on file    Attends religious service: Not on file    Active member of club or organization: Not on file    Attends meetings of clubs or organizations: Not on file    Relationship status: Not on file  Other Topics Concern  . Not on file  Social History Narrative   Exercise: walking, 4 times/week for 30 minutes.    Family History  Problem Relation Age of Onset  . Diabetes Mother   . Heart disease Mother   . Stroke Father        2011  . Hyperlipidemia Father   . Diabetes Sister   . Heart disease Sister   . Heart disease Brother        congenital     ROS Review of Systems See HPI Constitution: No fevers or chills No malaise No diaphoresis Skin: No rash or itching Eyes: no blurry vision, no double vision GU: no dysuria or hematuria Neuro: no dizziness or headaches all others reviewed and negative   Objective: Vitals:   01/11/18 0933  BP: 110/70  Pulse: 80  Resp: 16  Temp: 98.6 F (37 C)  TempSrc: Oral  SpO2: 99%  Weight: 110 lb 9.6 oz (50.2 kg)  Height: 5' 3.5" (1.613 m)   Diabetic Foot Exam - Simple   Simple Foot Form Diabetic Foot exam was performed with the following findings:  Yes 01/11/2018 10:01 AM  Visual Inspection No deformities, no ulcerations, no other skin breakdown bilaterally:  Yes Sensation Testing Intact to touch and monofilament testing bilaterally:   Yes Pulse Check Posterior Tibialis and Dorsalis pulse intact bilaterally:  Yes Comments      Physical Exam Physical Exam  Constitutional: She is oriented to person, place, and time. She appears well-developed and well-nourished.  HENT:  Head: Normocephalic and atraumatic.  Eyes: Conjunctivae and EOM are normal.  Cardiovascular: Normal rate, regular rhythm and normal heart sounds.   Pulmonary/Chest: Effort normal and breath sounds normal. No respiratory distress. She has no wheezes.  Abdominal: Normal appearance and bowel sounds are normal. There is no tenderness. There is no CVA tenderness. No rebound or guarding.  Neurological: She is alert and oriented to person, place, and time.   Lab Results  Component Value Date   CREATININE 1.07 (H) 12/14/2017    Assessment and Plan Carmen Anderson was seen today for nicotine dependence and cough and congestion.  Diagnoses and all orders for this visit:  Former smoker- discussed ways to prevent smoking and how to handle cravings  Non-ST elevation (NSTEMI) myocardial infarction [facility](HCC)- improving pt tolerating cardiac rehab  Diabetes mellitus with cardiac complication (HCC)- discussed Jardiance  Since her a1c is very well controlled will plan to discontinue to in 3 months Discussed that she should continue hydration and still to ADA diet -     HM Diabetes Foot Exam  Cough- viral most likely, clear lung exam flonase and supportive care advised    Toua Stites A [facility]

## 2018-01-13 ENCOUNTER — Encounter (HOSPITAL_COMMUNITY)
Admission: RE | Admit: 2018-01-13 | Discharge: 2018-01-13 | Disposition: A | Discharge status: Home / Self Care | Payer: BLUE CROSS/BLUE SHIELD | Source: Ambulatory Visit | Attending: Cardiology | Admitting: Cardiology

## 2018-01-13 ENCOUNTER — Encounter (HOSPITAL_COMMUNITY): Payer: BLUE CROSS/BLUE SHIELD

## 2018-01-13 DIAGNOSIS — Z48812 Encounter for surgical aftercare following surgery on the circulatory system: Secondary | ICD-10-CM | POA: Diagnosis not present

## 2018-01-13 DIAGNOSIS — Z955 Presence of coronary angioplasty implant and graft: Secondary | ICD-10-CM

## 2018-01-13 DIAGNOSIS — I214 Non-ST elevation (NSTEMI) myocardial infarction: Secondary | ICD-10-CM

## 2018-01-15 ENCOUNTER — Encounter (HOSPITAL_COMMUNITY)
Admission: RE | Admit: 2018-01-15 | Discharge: 2018-01-15 | Disposition: A | Discharge status: Home / Self Care | Payer: BLUE CROSS/BLUE SHIELD | Source: Ambulatory Visit | Attending: Cardiology | Admitting: Cardiology

## 2018-01-15 ENCOUNTER — Encounter (HOSPITAL_COMMUNITY): Payer: BLUE CROSS/BLUE SHIELD

## 2018-01-15 DIAGNOSIS — I214 Non-ST elevation (NSTEMI) myocardial infarction: Secondary | ICD-10-CM

## 2018-01-15 DIAGNOSIS — Z48812 Encounter for surgical aftercare following surgery on the circulatory system: Secondary | ICD-10-CM | POA: Diagnosis not present

## 2018-01-15 DIAGNOSIS — Z955 Presence of coronary angioplasty implant and graft: Secondary | ICD-10-CM | POA: Diagnosis not present

## 2018-01-18 ENCOUNTER — Encounter (HOSPITAL_COMMUNITY)
Admission: RE | Admit: 2018-01-18 | Discharge: 2018-01-18 | Disposition: A | Discharge status: Home / Self Care | Payer: BLUE CROSS/BLUE SHIELD | Source: Ambulatory Visit | Attending: Cardiology | Admitting: Cardiology

## 2018-01-18 ENCOUNTER — Encounter (HOSPITAL_COMMUNITY): Payer: BLUE CROSS/BLUE SHIELD

## 2018-01-18 DIAGNOSIS — I214 Non-ST elevation (NSTEMI) myocardial infarction: Secondary | ICD-10-CM | POA: Diagnosis not present

## 2018-01-18 DIAGNOSIS — Z955 Presence of coronary angioplasty implant and graft: Secondary | ICD-10-CM

## 2018-01-18 DIAGNOSIS — Z48812 Encounter for surgical aftercare following surgery on the circulatory system: Secondary | ICD-10-CM | POA: Diagnosis not present

## 2018-01-19 ENCOUNTER — Telehealth: Payer: Self-pay | Admitting: Cardiovascular Disease

## 2018-01-19 NOTE — Telephone Encounter (Signed)
Returned call to Dr. Haskel SchroederPuckett's office. This office needs a letter stating whether or not patient will need pre-meds for dental cleaning, etc and if there is a waiting period between her heart cath and when she can resume dental cleanings/work.   Informed office staff that anything concerning extractions would need to be sent to our office in the form of a formal clearance request.   Dentist office made aware MD is out of the office  Letter can be faxed to [phone]

## 2018-01-19 NOTE — Telephone Encounter (Signed)
New Message   Carmen DroughtErica with Dr. Revonda HumphreyPuckett DDS dentistry is calling wanting to know if they can receive a letter stating the pt was released from care  And any recommendations if any. Please call or fax: [phone]

## 2018-01-20 ENCOUNTER — Encounter (HOSPITAL_COMMUNITY): Payer: BLUE CROSS/BLUE SHIELD

## 2018-01-20 ENCOUNTER — Encounter (HOSPITAL_COMMUNITY)
Admission: RE | Admit: 2018-01-20 | Discharge: 2018-01-20 | Disposition: A | Discharge status: Home / Self Care | Payer: BLUE CROSS/BLUE SHIELD | Source: Ambulatory Visit | Attending: Cardiology | Admitting: Cardiology

## 2018-01-20 ENCOUNTER — Encounter (HOSPITAL_COMMUNITY): Payer: Self-pay

## 2018-01-20 DIAGNOSIS — Z955 Presence of coronary angioplasty implant and graft: Secondary | ICD-10-CM | POA: Diagnosis not present

## 2018-01-20 DIAGNOSIS — I214 Non-ST elevation (NSTEMI) myocardial infarction: Secondary | ICD-10-CM | POA: Diagnosis not present

## 2018-01-20 DIAGNOSIS — Z48812 Encounter for surgical aftercare following surgery on the circulatory system: Secondary | ICD-10-CM | POA: Diagnosis not present

## 2018-01-21 NOTE — Progress Notes (Signed)
Cardiac Individual Treatment Plan  Patient Details  Name: BRADLEIGH SONNEN MRN: [id number] Date of Birth: 08/25/1960 Referring Provider:     CARDIAC REHAB PHASE II ORIENTATION from 11/12/2017 in [facility] CARDIAC REHAB  Referring Provider  Yates Decamp MD      Initial Encounter Date:    CARDIAC REHAB PHASE II ORIENTATION from 11/12/2017 in [facility] CARDIAC REHAB  Date  11/12/17      Visit Diagnosis: NSTEMI (non-ST elevated myocardial infarction) [facility])  Status post coronary artery stent placement  Patient's Home Medications on Admission:  Current Outpatient Medications:  .  aspirin EC 81 MG EC tablet, Take 1 tablet (81 mg total) by mouth daily., Disp: , Rfl:  .  atorvastatin (LIPITOR) 80 MG tablet, Take 1 tablet (80 mg total) by mouth daily at 6 PM., Disp: 90 tablet, Rfl: 3 .  empagliflozin (JARDIANCE) 10 MG TABS tablet, Take 10 mg by mouth daily., Disp: 90 mg, Rfl: 3 .  fluticasone (FLONASE) 50 MCG/ACT nasal spray, Place 2 sprays into both nostrils daily., Disp: 16 g, Rfl: 6 .  gabapentin (NEURONTIN) 100 MG capsule, TAKE 1 CAPSULE (100 MG TOTAL) BY MOUTH AT BEDTIME., Disp: 90 capsule, Rfl: 3 .  levothyroxine (SYNTHROID, LEVOTHROID) 50 MCG tablet, TAKE 1 TABLET BY MOUTH EVERY DAY, Disp: 90 tablet, Rfl: 3 .  lisinopril (PRINIVIL,ZESTRIL) 5 MG tablet, Take 1 tablet (5 mg total) by mouth daily., Disp: 90 tablet, Rfl: 3 .  metFORMIN (GLUCOPHAGE-XR) 500 MG 24 hr tablet, TAKE 1 TABLET BY MOUTH TWICE DAILY WITH MEALS AS DIRECTED, Disp: 180 tablet, Rfl: 3 .  nitroGLYCERIN (NITROSTAT) 0.4 MG SL tablet, Place 1 tablet (0.4 mg total) under the tongue every 5 (five) minutes x 3 doses as needed for chest pain., Disp: 25 tablet, Rfl: 3 .  ticagrelor (BRILINTA) 90 MG TABS tablet, Take 1 tablet (90 mg total) by mouth 2 (two) times daily., Disp: 180 tablet, Rfl: 3  Past Medical History: Past Medical History:  Diagnosis Date  . DM type 2 (diabetes mellitus, type 2)  (HCC)   . History of hiatal hernia   . Hyperlipidemia   . Hypertension   . Hypothyroidism     Tobacco Use: Social History   Tobacco Use  Smoking Status Former Smoker  . Packs/day: 0.50  . Years: 27.00  . Pack years: 13.50  . Types: Cigarettes  . Last attempt to quit: 09/05/2017  . Years since quitting: 0.3  Smokeless Tobacco Never Used    Labs: Recent Review Flowsheet Data    Labs for ITP Cardiac and Pulmonary Rehab Latest Ref Rng & Units 08/14/2017 09/05/2017 09/06/2017 10/20/2017 12/14/2017   Cholestrol 100 - 199 mg/dL - 409(W) - 119 -   LDLCALC 0 - 99 mg/dL - 147(W) - 52 -   HDL >29 mg/dL - 46 - 43 -   Trlycerides 0 - 149 mg/dL - 562 - 74 -   Hemoglobin A1c 4.0 - 5.6 % 7 - 6.9(H) - 6.6(A)      Capillary Blood Glucose: Lab Results  Component Value Date   GLUCAP 101 (H) 11/20/2017   GLUCAP 115 (H) 11/18/2017   GLUCAP 116 (H) 11/18/2017   GLUCAP 196 (H) 11/12/2017   GLUCAP 93 09/08/2017     Exercise Target Goals:    Exercise Program Goal: Individual exercise prescription set using results from initial 6 min walk test and THRR while considering  patient's activity barriers and safety.   Exercise Prescription Goal: Initial exercise  prescription builds to 30-45 minutes a day of aerobic activity, 2-3 days per week.  Home exercise guidelines will be given to patient during program as part of exercise prescription that the participant will acknowledge.  Activity Barriers & Risk Stratification: Activity Barriers & Cardiac Risk Stratification - 11/12/17 0817      Activity Barriers & Cardiac Risk Stratification   Activity Barriers  Deconditioning;Shortness of Breath;Muscular Weakness;Other (comment)    Comments  B neuropathy    Cardiac Risk Stratification  High       6 Minute Walk: 6 Minute Walk    Row Name 11/12/17 1107         6 Minute Walk   Phase  Initial     Distance  1633 feet     Walk Time  6 minutes     # of Rest Breaks  0     MPH  3.1     METS  4.5      RPE  11     VO2 Peak  15.9     Symptoms  No     Resting HR  69 bpm     Resting BP  106/56     Resting Oxygen Saturation   100 %     Exercise Oxygen Saturation  during 6 min walk  100 %     Max Ex. HR  95 bpm     Max Ex. BP  124/60     2 Minute Post BP  112/60        Oxygen Initial Assessment:   Oxygen Re-Evaluation:   Oxygen Discharge (Final Oxygen Re-Evaluation):   Initial Exercise Prescription: Initial Exercise Prescription - 11/12/17 1100      Date of Initial Exercise RX and Referring Provider   Date  11/12/17    Referring Provider  Yates Decamp MD      Treadmill   MPH  2.7    Grade  0    Minutes  10    METs  3.07      Bike   Level  0.7    Minutes  10    METs  3.64      NuStep   Level  3    SPM  80    Minutes  10    METs  3      Prescription Details   Frequency (times per week)  3    Duration  Progress to 30 minutes of continuous aerobic without signs/symptoms of physical distress      Intensity   THRR 40-80% of Max Heartrate  66-132    Ratings of Perceived Exertion  11-13    Perceived Dyspnea  0-4      Progression   Progression  Continue to progress workloads to maintain intensity without signs/symptoms of physical distress.      Resistance Training   Training Prescription  Yes    Weight  3lbs    Reps  10-15       Perform Capillary Blood Glucose checks as needed.  Exercise Prescription Changes: Exercise Prescription Changes    Row Name 11/18/17 1100 11/25/17 1000 12/14/17 0737 12/24/17 0700 01/04/18 1600     Response to Exercise   Blood Pressure (Admit)  98/56  110/60  130/60  102/62  124/64   Blood Pressure (Exercise)  134/80  148/80  126/60  130/66  138/72   Blood Pressure (Exit)  112/60  112/60  106/58  100/60  102/60   Heart Rate (Admit)  72 bpm  85 bpm  68 bpm  63 bpm  73 bpm   Heart Rate (Exercise)  113 bpm  110 bpm  103 bpm  102 bpm  110 bpm   Heart Rate (Exit)  64 bpm  62 bpm  67 bpm  61 bpm  71 bpm   Rating of Perceived  Exertion (Exercise)  13  9  14  14  13    Perceived Dyspnea (Exercise)  0  0  0  0  0   Symptoms  None  None   None  None  None   Comments  Pt oriented to exericse equipment  -  -  -  -   Duration  Progress to 30 minutes of  aerobic without signs/symptoms of physical distress  Continue with 30 min of aerobic exercise without signs/symptoms of physical distress.  Continue with 30 min of aerobic exercise without signs/symptoms of physical distress.  Progress to 45 minutes of aerobic exercise without signs/symptoms of physical distress  Progress to 45 minutes of aerobic exercise without signs/symptoms of physical distress   Intensity  THRR New  THRR unchanged  THRR unchanged  THRR unchanged  THRR unchanged     Progression   Progression  Continue to progress workloads to maintain intensity without signs/symptoms of physical distress.  Continue to progress workloads to maintain intensity without signs/symptoms of physical distress.  Continue to progress workloads to maintain intensity without signs/symptoms of physical distress.  Continue to progress workloads to maintain intensity without signs/symptoms of physical distress.  Continue to progress workloads to maintain intensity without signs/symptoms of physical distress.   Average METs  2.83  3.71  3.9  4.03  4.06     Resistance Training   Training Prescription  No  No  Yes  No  Yes   Weight  -  -  3lbs  -  4lbs   Reps  -  -  10-15  -  10-15   Time  -  -  10 Minutes  -  10 Minutes     Interval Training   Interval Training  No  No  No  No  No     Treadmill   MPH  2.5  2.7  3.7  3.8  2.8   Grade  0  3  3  3  3    Minutes  10  10  10  10  10    METs  2.91  3.81  4  4.3  4.3     Bike   Level  0.7  0.7  0.7  0.7  0.7   Minutes  10  10  10  10  10    METs  3.62  3.61  3.55  3.59  3.57     NuStep   Level  2  4  4  5  5    SPM  85  85  85  95  105   Minutes  10  10  10  10  10    METs  2.5  3.7  4  4.2  4.3     Home Exercise Plan   Plans to  continue exercise at  -  Home (comment) [employer]  Home (comment) Home Treadmill   Home (comment) Home Treadmill   Home (comment) Home Treadmill    Frequency  -  Add 2 additional days to program exercise sessions.  Add 2 additional days to program exercise sessions.  Add 2 additional days to  program exercise sessions.  Add 2 additional days to program exercise sessions.   Initial Home Exercises Provided  -  11/25/17  11/25/17  11/25/17  11/25/17   Row Name 01/20/18 1400             Response to Exercise   Blood Pressure (Admit)  108/64       Blood Pressure (Exercise)  164/62       Blood Pressure (Exit)  112/62       Heart Rate (Admit)  73 bpm       Heart Rate (Exercise)  101 bpm       Heart Rate (Exit)  73 bpm       Rating of Perceived Exertion (Exercise)  13       Perceived Dyspnea (Exercise)  0       Symptoms  None       Duration  Continue with 45 min of aerobic exercise without signs/symptoms of physical distress.       Intensity  THRR unchanged         Progression   Progression  Continue to progress workloads to maintain intensity without signs/symptoms of physical distress.       Average METs  4.11         Resistance Training   Training Prescription  No         Interval Training   Interval Training  No         Treadmill   MPH  2.9       Grade  3       Minutes  10       METs  4.3         Bike   Level  0.7       Minutes  10       METs  3.64         NuStep   Level  5       SPM  105       Minutes  10       METs  4.4         Home Exercise Plan   Plans to continue exercise at  Home (comment) Home Treadmill        Frequency  Add 2 additional days to program exercise sessions.       Initial Home Exercises Provided  11/25/17          Exercise Comments: Exercise Comments    Row Name 11/18/17 1119 11/25/17 1027 11/25/17 1029 12/24/17 0719 01/20/18 1414   Exercise Comments  Pt's first day of exercise. Pt off to good start with exercise prescription. Pt  oriented to exercise equipment. Will continue to monitor  Reviewed METs, goals and HEP with pt. Pt is responding well to her exercise prescription.   Reviewed METs, goals and HEP with pt. Pt is responding well to her exercise prescription. Pt is currently walking 2 days at a local school track. Will continue to monitor and follow up with pt.   Reviewed METs and goals with pt. Pt has now purchased a home treadmill. Pt is consistently exercising at home and is excited about new treadmill. Will continue to monitor and progress pt as tolerated.   Reviewed METs and goals with pt. Pt is continuing to exercise on her own in addition to exercise in cardiac rehab. Pt is also responding well to workload increases.      Exercise Goals and Review: Exercise Goals  Row Name 11/12/17 0827             Exercise Goals   Increase Physical Activity  Yes       Intervention  Provide advice, education, support and counseling about physical activity/exercise needs.;Develop an individualized exercise prescription for aerobic and resistive training based on initial evaluation findings, risk stratification, comorbidities and participant's personal goals.       Expected Outcomes  Short Term: Attend rehab on a regular basis to increase amount of physical activity.;Long Term: Exercising regularly at least 3-5 days a week.;Long Term: Add in home exercise to make exercise part of routine and to increase amount of physical activity.       Increase Strength and Stamina  Yes increase walking tolerance; Increase tone in mid section       Intervention  Provide advice, education, support and counseling about physical activity/exercise needs.;Develop an individualized exercise prescription for aerobic and resistive training based on initial evaluation findings, risk stratification, comorbidities and participant's personal goals.       Expected Outcomes  Long Term: Improve cardiorespiratory fitness, muscular endurance and strength as  measured by increased METs and functional capacity ( );Short Term: Perform resistance training exercises routinely during rehab and add in resistance training at home;Short Term: Increase workloads from initial exercise prescription for resistance, speed, and METs.       Able to understand and use rate of perceived exertion (RPE) scale  Yes       Intervention  Provide education and explanation on how to use RPE scale       Expected Outcomes  Short Term: Able to use RPE daily in rehab to express subjective intensity level;Long Term:  Able to use RPE to guide intensity level when exercising independently       Knowledge and understanding of Target Heart Rate Range (THRR)  Yes       Intervention  Provide education and explanation of THRR including how the numbers were predicted and where they are located for reference       Expected Outcomes  Short Term: Able to state/look up THRR;Long Term: Able to use THRR to govern intensity when exercising independently;Short Term: Able to use daily as guideline for intensity in rehab       Able to check pulse independently  Yes       Intervention  Provide education and demonstration on how to check pulse in carotid and radial arteries.;Review the importance of being able to check your own pulse for safety during independent exercise       Expected Outcomes  Short Term: Able to explain why pulse checking is important during independent exercise;Long Term: Able to check pulse independently and accurately       Understanding of Exercise Prescription  Yes       Intervention  Provide education, explanation, and written materials on patient's individual exercise prescription       Expected Outcomes  Short Term: Able to explain program exercise prescription;Long Term: Able to explain home exercise prescription to exercise independently          Exercise Goals Re-Evaluation : Exercise Goals Re-Evaluation    Row Name 11/25/17 1029 12/24/17 0724 01/20/18 1419          Exercise Goal Re-Evaluation   Exercise Goals Review  Increase Physical Activity;Able to understand and use rate of perceived exertion (RPE) scale;Knowledge and understanding of Target Heart Rate Range (THRR);Understanding of Exercise Prescription;Increase Strength and Stamina;Able to check pulse independently  Understanding of  Exercise Prescription;Increase Physical Activity  Understanding of Exercise Prescription;Increase Physical Activity     Comments  Reviewed HEP with pt. Also Reviewed pt RPE Scale, THRR, weather conditions, NTG use, endpoints of exercise, warmup and cool down.   Pt is responding well to workload increases. Pt is very motivated with exercise. Pt states she is feeling stronger and is starting to influence husband to exercise with her.   Reviewed home exercise with pt. Pt is walking on treadmill at home 3 days a week for 10-15 minutes. Pt is building endurance to gradully increase walking time. Will continue to increase workloads as pt tolerates it.      Expected Outcomes  Pt will continue to walk 2-3 additional days for 30-45 minutes. Pt will continue to increase cardiovascular and muscular strength. Will continue to monitor and progress pt as tolerated.   Pt will continue to walk at home treadmill for 30-45 minutes 2-3 days a week. Pt will continue to increase strength and stamina. Will continue to monitor.   Pt will continue to exericse at home on treadmill for 15 minutes. Pt will work to slowly increase exercise time. Pt will continue to increase cardiovascular strength. Will continue to monitor and progress pt.          Discharge Exercise Prescription (Final Exercise Prescription Changes): Exercise Prescription Changes - 01/20/18 1400      Response to Exercise   Blood Pressure (Admit)  108/64    Blood Pressure (Exercise)  164/62    Blood Pressure (Exit)  112/62    Heart Rate (Admit)  73 bpm    Heart Rate (Exercise)  101 bpm    Heart Rate (Exit)  73 bpm    Rating of  Perceived Exertion (Exercise)  13    Perceived Dyspnea (Exercise)  0    Symptoms  None    Duration  Continue with 45 min of aerobic exercise without signs/symptoms of physical distress.    Intensity  THRR unchanged      Progression   Progression  Continue to progress workloads to maintain intensity without signs/symptoms of physical distress.    Average METs  4.11      Resistance Training   Training Prescription  No      Interval Training   Interval Training  No      Treadmill   MPH  2.9    Grade  3    Minutes  10    METs  4.3      Bike   Level  0.7    Minutes  10    METs  3.64      NuStep   Level  5    SPM  105    Minutes  10    METs  4.4      Home Exercise Plan   Plans to continue exercise at  Home (comment) Home Treadmill     Frequency  Add 2 additional days to program exercise sessions.    Initial Home Exercises Provided  11/25/17       Nutrition:  Target Goals: Understanding of nutrition guidelines, daily intake of sodium 1500mg , cholesterol 200mg , calories 30% from fat and 7% or less from saturated fats, daily to have 5 or more servings of fruits and vegetables.  Biometrics: Pre Biometrics - 11/12/17 1132      Pre Biometrics   % Body Fat  18.3 %        Nutrition Therapy Plan and Nutrition Goals: Nutrition Therapy & Goals -  11/12/17 0927      Nutrition Therapy   Diet  Consistent Carb, Heart Healthy      Personal Nutrition Goals   Nutrition Goal  CBG concentrations in the normal range or as close to normal as is safely possible.      Intervention Plan   Intervention  Prescribe, educate and counsel regarding individualized specific dietary modifications aiming towards targeted core components such as weight, hypertension, lipid management, diabetes, heart failure and other comorbidities.    Expected Outcomes  Short Term Goal: Understand basic principles of dietary content, such as calories, fat, sodium, cholesterol and nutrients.;Long Term Goal:  Adherence to prescribed nutrition plan.       Nutrition Assessments: Nutrition Assessments - 11/12/17 0927      MEDFICTS Scores   Pre Score  6       Nutrition Goals Re-Evaluation:   Nutrition Goals Re-Evaluation:   Nutrition Goals Discharge (Final Nutrition Goals Re-Evaluation):   Psychosocial: Target Goals: Acknowledge presence or absence of significant depression and/or stress, maximize coping skills, provide positive support system. Participant is able to verbalize types and ability to use techniques and skills needed for reducing stress and depression.  Initial Review & Psychosocial Screening: Initial Psych Review & Screening - 11/12/17 1132      Initial Review   Current issues with  Current Stress Concerns    Source of Stress Concerns  Occupation      Family Dynamics   Good Support System?  Yes Pt reports her spouse and sisters as her support system.       Barriers   Psychosocial barriers to participate in program  The patient should benefit from training in stress management and relaxation.      Screening Interventions   Interventions  Encouraged to exercise    Expected Outcomes  Long Term Goal: Stressors or current issues are controlled or eliminated. Pt will continue to exhibit a positive outlook utilizing good coping skills.        Quality of Life Scores: Quality of Life - 11/12/17 1134      Quality of Life Scores   Health/Function Pre  28 %    Socioeconomic Pre  26.57 %    Psych/Spiritual Pre  29.14 %    Family Pre  30 %    GLOBAL Pre  28.18 %      Scores of 19 and below usually indicate a poorer quality of life in these areas.  A difference of  2-3 points is a clinically meaningful difference.  A difference of 2-3 points in the total score of the Quality of Life Index has been associated with significant improvement in overall quality of life, self-image, physical symptoms, and general health in studies assessing change in quality of  life.  PHQ-9: Recent Review Flowsheet Data    Depression screen [facility] 2/9 01/11/2018 11/18/2017 11/12/2017 10/07/2017 09/12/2017   Decreased Interest 0 0 0 0 0   Down, Depressed, Hopeless 0 0 0 0 0   PHQ - 2 Score 0 0 0 0 0     Interpretation of Total Score  Total Score Depression Severity:  1-4 = Minimal depression, 5-9 = Mild depression, 10-14 = Moderate depression, 15-19 = Moderately severe depression, 20-27 = Severe depression   Psychosocial Evaluation and Intervention: Psychosocial Evaluation - 11/18/17 1622      Psychosocial Evaluation & Interventions   Interventions  Encouraged to exercise with the program and follow exercise prescription;Stress management education;Relaxation education    Comments  No  psychosocial needs identified. No intervention necessary.    Expected Outcomes  Pt will exhibit positive outlook utilizing good coping skills.    Continue Psychosocial Services   No Follow up required       Psychosocial Re-Evaluation: Psychosocial Re-Evaluation    Row Name 11/25/17 1340 12/18/17 1043 01/20/18 1610         Psychosocial Re-Evaluation   Current issues with  None Identified  None Identified  None Identified     Comments  No psychosocial needs identified.  No interventions necessary.  No psychosocial needs identified.  No interventions necessary.  No psychosocial needs identified.  No interventions necessary.     Expected Outcomes  Pt will continue to exhibit a positve outlook utilizing good coping skills.  Pt will continue to exhibit a positve outlook utilizing good coping skills.  Pt will continue to exhibit a positve outlook utilizing good coping skills.     Interventions  Encouraged to attend Cardiac Rehabilitation for the exercise  Encouraged to attend Cardiac Rehabilitation for the exercise  Encouraged to attend Cardiac Rehabilitation for the exercise     Continue Psychosocial Services   No Follow up required  No Follow up required  No Follow up required         Psychosocial Discharge (Final Psychosocial Re-Evaluation): Psychosocial Re-Evaluation - 01/20/18 9604      Psychosocial Re-Evaluation   Current issues with  None Identified    Comments  No psychosocial needs identified.  No interventions necessary.    Expected Outcomes  Pt will continue to exhibit a positve outlook utilizing good coping skills.    Interventions  Encouraged to attend Cardiac Rehabilitation for the exercise    Continue Psychosocial Services   No Follow up required       Vocational Rehabilitation: Provide vocational rehab assistance to qualifying candidates.   Vocational Rehab Evaluation & Intervention: Vocational Rehab - 11/12/17 1131      Initial Vocational Rehab Evaluation & Intervention   Assessment shows need for Vocational Rehabilitation  No       Education: Education Goals: Education classes will be provided on a weekly basis, covering required topics. Participant will state understanding/return demonstration of topics presented.  Learning Barriers/Preferences: Learning Barriers/Preferences - 11/12/17 0827      Learning Barriers/Preferences   Learning Barriers  Sight;Hearing    Learning Preferences  Skilled Demonstration       Education Topics: Count Your Pulse:  -Group instruction provided by verbal instruction, demonstration, patient participation and written materials to support subject.  Instructors address importance of being able to find your pulse and how to count your pulse when at home without a heart monitor.  Patients get hands on experience counting their pulse with staff help and individually.   CARDIAC REHAB PHASE II EXERCISE from 01/08/2018 in [facility] CARDIAC REHAB  Date  01/08/18  Instruction Review Code  2- Demonstrated Understanding      Heart Attack, Angina, and Risk Factor Modification:  -Group instruction provided by verbal instruction, video, and written materials to support subject.  Instructors address  signs and symptoms of angina and heart attacks.    Also discuss risk factors for heart disease and how to make changes to improve heart health risk factors.   Functional Fitness:  -Group instruction provided by verbal instruction, demonstration, patient participation, and written materials to support subject.  Instructors address safety measures for doing things around the house.  Discuss how to get up and down off the floor, how  to pick things up properly, how to safely get out of a chair without assistance, and balance training.   Meditation and Mindfulness:  -Group instruction provided by verbal instruction, patient participation, and written materials to support subject.  Instructor addresses importance of mindfulness and meditation practice to help reduce stress and improve awareness.  Instructor also leads participants through a meditation exercise.    Stretching for Flexibility and Mobility:  -Group instruction provided by verbal instruction, patient participation, and written materials to support subject.  Instructors lead participants through series of stretches that are designed to increase flexibility thus improving mobility.  These stretches are additional exercise for major muscle groups that are typically performed during regular warm up and cool down.   Hands Only CPR:  -Group verbal, video, and participation provides a basic overview of AHA guidelines for community CPR. Role-play of emergencies allow participants the opportunity to practice calling for help and chest compression technique with discussion of AED use.   Hypertension: -Group verbal and written instruction that provides a basic overview of hypertension including the most recent diagnostic guidelines, risk factor reduction with self-care instructions and medication management.    Nutrition I class: Heart Healthy Eating:  -Group instruction provided by PowerPoint slides, verbal discussion, and written materials to  support subject matter. The instructor gives an explanation and review of the Therapeutic Lifestyle Changes diet recommendations, which includes a discussion on lipid goals, dietary fat, sodium, fiber, plant stanol/sterol esters, sugar, and the components of a well-balanced, healthy diet.   CARDIAC REHAB PHASE II EXERCISE from 01/08/2018 in [facility] CARDIAC REHAB  Date  12/15/17  Educator  RD  Instruction Review Code  2- Demonstrated Understanding      Nutrition II class: Lifestyle Skills:  -Group instruction provided by PowerPoint slides, verbal discussion, and written materials to support subject matter. The instructor gives an explanation and review of label reading, grocery shopping for heart health, heart healthy recipe modifications, and ways to make healthier choices when eating out.   CARDIAC REHAB PHASE II EXERCISE from 01/08/2018 in [facility] CARDIAC REHAB  Date  12/23/17  Educator  RD      Diabetes Question & Answer:  -Group instruction provided by PowerPoint slides, verbal discussion, and written materials to support subject matter. The instructor gives an explanation and review of diabetes co-morbidities, pre- and post-prandial blood glucose goals, pre-exercise blood glucose goals, signs, symptoms, and treatment of hypoglycemia and hyperglycemia, and foot care basics.   Diabetes Blitz:  -Group instruction provided by PowerPoint slides, verbal discussion, and written materials to support subject matter. The instructor gives an explanation and review of the physiology behind type 1 and type 2 diabetes, diabetes medications and rational behind using different medications, pre- and post-prandial blood glucose recommendations and Hemoglobin A1c goals, diabetes diet, and exercise including blood glucose guidelines for exercising safely.    CARDIAC REHAB PHASE II EXERCISE from 01/08/2018 in [facility] CARDIAC REHAB  Date  12/23/17   Educator  RD      Portion Distortion:  -Group instruction provided by PowerPoint slides, verbal discussion, written materials, and food models to support subject matter. The instructor gives an explanation of serving size versus portion size, changes in portions sizes over the last 20 years, and what consists of a serving from each food group.   Stress Management:  -Group instruction provided by verbal instruction, video, and written materials to support subject matter.  Instructors review role of stress in heart  disease and how to cope with stress positively.     Exercising on Your Own:  -Group instruction provided by verbal instruction, power point, and written materials to support subject.  Instructors discuss benefits of exercise, components of exercise, frequency and intensity of exercise, and end points for exercise.  Also discuss use of nitroglycerin and activating EMS.  Review options of places to exercise outside of rehab.  Review guidelines for sex with heart disease.   Cardiac Drugs I:  -Group instruction provided by verbal instruction and written materials to support subject.  Instructor reviews cardiac drug classes: antiplatelets, anticoagulants, beta blockers, and statins.  Instructor discusses reasons, side effects, and lifestyle considerations for each drug class.   Cardiac Drugs II:  -Group instruction provided by verbal instruction and written materials to support subject.  Instructor reviews cardiac drug classes: angiotensin converting enzyme inhibitors (ACE-I), angiotensin II receptor blockers (ARBs), nitrates, and calcium channel blockers.  Instructor discusses reasons, side effects, and lifestyle considerations for each drug class.   Anatomy and Physiology of the Circulatory System:  Group verbal and written instruction and models provide basic cardiac anatomy and physiology, with the coronary electrical and arterial systems. Review of: AMI, Angina, Valve disease,  Heart Failure, Peripheral Artery Disease, Cardiac Arrhythmia, Pacemakers, and the ICD.   Other Education:  -Group or individual verbal, written, or video instructions that support the educational goals of the cardiac rehab program.   Holiday Eating Survival Tips:  -Group instruction provided by PowerPoint slides, verbal discussion, and written materials to support subject matter. The instructor gives patients tips, tricks, and techniques to help them not only survive but enjoy the holidays despite the onslaught of food that accompanies the holidays.   Knowledge Questionnaire Score: Knowledge Questionnaire Score - 11/12/17 1107      Knowledge Questionnaire Score   Pre Score  22/24       Core Components/Risk Factors/Patient Goals at Admission: Personal Goals and Risk Factors at Admission - 11/12/17 1139      Core Components/Risk Factors/Patient Goals on Admission    Weight Management  Yes;Weight Maintenance    Intervention  Weight Management: Develop a combined nutrition and exercise program designed to reach desired caloric intake, while maintaining appropriate intake of nutrient and fiber, sodium and fats, and appropriate energy expenditure required for the weight goal.;Weight Management: Provide education and appropriate resources to help participant work on and attain dietary goals.;Weight Management/Obesity: Establish reasonable short term and long term weight goals.    Expected Outcomes  Short Term: Continue to assess and modify interventions until short term weight is achieved;Long Term: Adherence to nutrition and physical activity/exercise program aimed toward attainment of established weight goal;Weight Maintenance: Understanding of the daily nutrition guidelines, which includes 25-35% calories from fat, 7% or less cal from saturated fats, less than 200mg  cholesterol, less than 1.5gm of sodium, & 5 or more servings of fruits and vegetables daily;Weight Loss: Understanding of general  recommendations for a balanced deficit meal plan, which promotes 1-2 lb weight loss per week and includes a negative energy balance of [phone] kcal/d;Understanding recommendations for meals to include 15-35% energy as protein, 25-35% energy from fat, 35-60% energy from carbohydrates, less than 200mg  of dietary cholesterol, 20-35 gm of total fiber daily;Understanding of distribution of calorie intake throughout the day with the consumption of 4-5 meals/snacks    Diabetes  Yes    Intervention  Provide education about signs/symptoms and action to take for hypo/hyperglycemia.;Provide education about proper nutrition, including hydration, and aerobic/resistive exercise prescription  along with prescribed medications to achieve blood glucose in normal ranges: Fasting glucose 65-99 mg/dL    Expected Outcomes  Short Term: Participant verbalizes understanding of the signs/symptoms and immediate care of hyper/hypoglycemia, proper foot care and importance of medication, aerobic/resistive exercise and nutrition plan for blood glucose control.;Long Term: Attainment of HbA1C < 7%.    Hypertension  Yes    Intervention  Provide education on lifestyle modifcations including regular physical activity/exercise, weight management, moderate sodium restriction and increased consumption of fresh fruit, vegetables, and low fat dairy, alcohol moderation, and smoking cessation.;Monitor prescription use compliance.    Expected Outcomes  Short Term: Continued assessment and intervention until BP is < 140/34mm HG in hypertensive participants. < 130/28mm HG in hypertensive participants with diabetes, heart failure or chronic kidney disease.;Long Term: Maintenance of blood pressure at goal levels.    Lipids  Yes    Intervention  Provide education and support for participant on nutrition & aerobic/resistive exercise along with prescribed medications to achieve LDL 70mg , HDL >40mg .    Expected Outcomes  Short Term: Participant states  understanding of desired cholesterol values and is compliant with medications prescribed. Participant is following exercise prescription and nutrition guidelines.;Long Term: Cholesterol controlled with medications as prescribed, with individualized exercise RX and with personalized nutrition plan. Value goals: LDL < 70mg , HDL > 40 mg.    Stress  Yes    Intervention  Offer individual and/or small group education and counseling on adjustment to heart disease, stress management and health-related lifestyle change. Teach and support self-help strategies.;Refer participants experiencing significant psychosocial distress to appropriate mental health specialists for further evaluation and treatment. When possible, include family members and significant others in education/counseling sessions.    Expected Outcomes  Short Term: Participant demonstrates changes in health-related behavior, relaxation and other stress management skills, ability to obtain effective social support, and compliance with psychotropic medications if prescribed.;Long Term: Emotional wellbeing is indicated by absence of clinically significant psychosocial distress or social isolation.       Core Components/Risk Factors/Patient Goals Review:  Goals and Risk Factor Review    Row Name 11/18/17 1618 12/18/17 1042 01/20/18 0826         Core Components/Risk Factors/Patient Goals Review   Personal Goals Review  Weight Management/Obesity;Diabetes;Lipids;Hypertension;Stress  Weight Management/Obesity;Diabetes;Lipids;Hypertension;Stress  Weight Management/Obesity;Diabetes;Lipids;Hypertension;Stress     Review  Pt with multiple CAD RF eager to participate in CR Program.   Caprice is continuing to participate in CR exercise.  She feels that she is increasing the strength in her arms and legs.   Sherly is continuing to participate in CR exercise.  She is continuing to increase her strength.      Expected Outcomes  Pt with multiple CAD RF will continue  to participate in CR exercise, diet, and lifestyle modification to reduce CV RF.   Pt with multiple CAD RF will continue to participate in CR exercise, diet, and lifestyle modification to reduce CV RF.   Christian continue to participate in CR exercise, diet, and lifestyle modification.        Core Components/Risk Factors/Patient Goals at Discharge (Final Review):  Goals and Risk Factor Review - 01/20/18 0826      Core Components/Risk Factors/Patient Goals Review   Personal Goals Review  Weight Management/Obesity;Diabetes;Lipids;Hypertension;Stress    Review  Jaretzy is continuing to participate in CR exercise.  She is continuing to increase her strength.     Expected Outcomes  Teyona continue to participate in CR exercise, diet, and lifestyle modification.  ITP Comments: ITP Comments    Row Name 11/12/17 0815 11/25/17 1339 12/18/17 1041 01/20/18 0820     ITP Comments  Dr. Armanda Magic, Medical Director  30 Day ITP Review; Pt recently started cardiac rehab and is off to a great start.  Kalli is tolerating exercise well.  30 Day ITP Review.  Kymora is tolerating exercise well.  She has great attendance.   30 Day ITP Review.  Arcelia is tolerating exercise well.  She continues to have good attendance and feels that she is increasing her strength.        Comments: See ITP Comments

## 2018-01-22 ENCOUNTER — Encounter (HOSPITAL_COMMUNITY): Payer: BLUE CROSS/BLUE SHIELD

## 2018-01-22 ENCOUNTER — Encounter (HOSPITAL_COMMUNITY)
Admission: RE | Admit: 2018-01-22 | Discharge: 2018-01-22 | Disposition: A | Discharge status: Home / Self Care | Payer: BLUE CROSS/BLUE SHIELD | Source: Ambulatory Visit | Attending: Cardiology | Admitting: Cardiology

## 2018-01-22 DIAGNOSIS — I214 Non-ST elevation (NSTEMI) myocardial infarction: Secondary | ICD-10-CM

## 2018-01-22 DIAGNOSIS — Z955 Presence of coronary angioplasty implant and graft: Secondary | ICD-10-CM | POA: Diagnosis not present

## 2018-01-22 DIAGNOSIS — Z48812 Encounter for surgical aftercare following surgery on the circulatory system: Secondary | ICD-10-CM | POA: Diagnosis not present

## 2018-01-25 ENCOUNTER — Encounter (HOSPITAL_COMMUNITY)
Admission: RE | Admit: 2018-01-25 | Discharge: 2018-01-25 | Disposition: A | Discharge status: Home / Self Care | Payer: BLUE CROSS/BLUE SHIELD | Source: Ambulatory Visit | Attending: Cardiology | Admitting: Cardiology

## 2018-01-25 ENCOUNTER — Encounter (HOSPITAL_COMMUNITY): Payer: BLUE CROSS/BLUE SHIELD

## 2018-01-25 DIAGNOSIS — I214 Non-ST elevation (NSTEMI) myocardial infarction: Secondary | ICD-10-CM

## 2018-01-25 DIAGNOSIS — Z955 Presence of coronary angioplasty implant and graft: Secondary | ICD-10-CM

## 2018-01-25 DIAGNOSIS — Z48812 Encounter for surgical aftercare following surgery on the circulatory system: Secondary | ICD-10-CM | POA: Diagnosis not present

## 2018-01-25 NOTE — Telephone Encounter (Signed)
No  premedication is necessary for her dental cleaning.

## 2018-01-26 NOTE — Telephone Encounter (Signed)
Telephone note printed and faxed to # requested

## 2018-01-27 ENCOUNTER — Encounter (HOSPITAL_COMMUNITY)
Admission: RE | Admit: 2018-01-27 | Discharge: 2018-01-27 | Disposition: A | Discharge status: Home / Self Care | Payer: BLUE CROSS/BLUE SHIELD | Source: Ambulatory Visit | Attending: Cardiology | Admitting: Cardiology

## 2018-01-27 ENCOUNTER — Encounter (HOSPITAL_COMMUNITY): Payer: BLUE CROSS/BLUE SHIELD

## 2018-01-27 DIAGNOSIS — I214 Non-ST elevation (NSTEMI) myocardial infarction: Secondary | ICD-10-CM

## 2018-01-27 DIAGNOSIS — Z955 Presence of coronary angioplasty implant and graft: Secondary | ICD-10-CM | POA: Diagnosis not present

## 2018-01-27 DIAGNOSIS — Z48812 Encounter for surgical aftercare following surgery on the circulatory system: Secondary | ICD-10-CM | POA: Diagnosis not present

## 2018-01-29 ENCOUNTER — Encounter (HOSPITAL_COMMUNITY): Payer: BLUE CROSS/BLUE SHIELD

## 2018-01-29 ENCOUNTER — Encounter (HOSPITAL_COMMUNITY)
Admission: RE | Admit: 2018-01-29 | Discharge: 2018-01-29 | Disposition: A | Discharge status: Home / Self Care | Payer: BLUE CROSS/BLUE SHIELD | Source: Ambulatory Visit | Attending: Cardiology | Admitting: Cardiology

## 2018-01-29 DIAGNOSIS — I214 Non-ST elevation (NSTEMI) myocardial infarction: Secondary | ICD-10-CM | POA: Diagnosis not present

## 2018-01-29 DIAGNOSIS — Z955 Presence of coronary angioplasty implant and graft: Secondary | ICD-10-CM

## 2018-01-29 DIAGNOSIS — Z48812 Encounter for surgical aftercare following surgery on the circulatory system: Secondary | ICD-10-CM | POA: Diagnosis not present

## 2018-02-01 ENCOUNTER — Encounter (HOSPITAL_COMMUNITY)
Admission: RE | Admit: 2018-02-01 | Discharge: 2018-02-01 | Disposition: A | Discharge status: Home / Self Care | Payer: BLUE CROSS/BLUE SHIELD | Source: Ambulatory Visit | Attending: Cardiology | Admitting: Cardiology

## 2018-02-01 ENCOUNTER — Encounter (HOSPITAL_COMMUNITY): Payer: BLUE CROSS/BLUE SHIELD

## 2018-02-01 VITALS — Ht 64.25 in | Wt 111.8 lb

## 2018-02-01 DIAGNOSIS — Z955 Presence of coronary angioplasty implant and graft: Secondary | ICD-10-CM

## 2018-02-01 DIAGNOSIS — I214 Non-ST elevation (NSTEMI) myocardial infarction: Secondary | ICD-10-CM | POA: Diagnosis not present

## 2018-02-01 DIAGNOSIS — Z48812 Encounter for surgical aftercare following surgery on the circulatory system: Secondary | ICD-10-CM | POA: Diagnosis not present

## 2018-02-03 ENCOUNTER — Encounter (HOSPITAL_COMMUNITY): Payer: BLUE CROSS/BLUE SHIELD

## 2018-02-03 ENCOUNTER — Encounter (HOSPITAL_COMMUNITY)
Admission: RE | Admit: 2018-02-03 | Discharge: 2018-02-03 | Disposition: A | Discharge status: Home / Self Care | Payer: BLUE CROSS/BLUE SHIELD | Source: Ambulatory Visit | Attending: Cardiology | Admitting: Cardiology

## 2018-02-03 DIAGNOSIS — Z48812 Encounter for surgical aftercare following surgery on the circulatory system: Secondary | ICD-10-CM | POA: Diagnosis not present

## 2018-02-03 DIAGNOSIS — Z955 Presence of coronary angioplasty implant and graft: Secondary | ICD-10-CM

## 2018-02-03 DIAGNOSIS — I214 Non-ST elevation (NSTEMI) myocardial infarction: Secondary | ICD-10-CM | POA: Diagnosis not present

## 2018-02-05 ENCOUNTER — Encounter (HOSPITAL_COMMUNITY): Payer: BLUE CROSS/BLUE SHIELD

## 2018-02-05 ENCOUNTER — Encounter (HOSPITAL_COMMUNITY)
Admission: RE | Admit: 2018-02-05 | Discharge: 2018-02-05 | Disposition: A | Discharge status: Home / Self Care | Payer: BLUE CROSS/BLUE SHIELD | Source: Ambulatory Visit | Attending: Cardiology | Admitting: Cardiology

## 2018-02-05 DIAGNOSIS — Z955 Presence of coronary angioplasty implant and graft: Secondary | ICD-10-CM

## 2018-02-05 DIAGNOSIS — Z48812 Encounter for surgical aftercare following surgery on the circulatory system: Secondary | ICD-10-CM | POA: Diagnosis not present

## 2018-02-05 DIAGNOSIS — I214 Non-ST elevation (NSTEMI) myocardial infarction: Secondary | ICD-10-CM | POA: Diagnosis not present

## 2018-02-08 ENCOUNTER — Encounter (HOSPITAL_COMMUNITY): Payer: BLUE CROSS/BLUE SHIELD

## 2018-02-08 ENCOUNTER — Encounter (HOSPITAL_COMMUNITY)
Admission: RE | Admit: 2018-02-08 | Discharge: 2018-02-08 | Disposition: A | Discharge status: Home / Self Care | Payer: BLUE CROSS/BLUE SHIELD | Source: Ambulatory Visit | Attending: Cardiology | Admitting: Cardiology

## 2018-02-08 DIAGNOSIS — Z955 Presence of coronary angioplasty implant and graft: Secondary | ICD-10-CM

## 2018-02-08 DIAGNOSIS — I214 Non-ST elevation (NSTEMI) myocardial infarction: Secondary | ICD-10-CM

## 2018-02-08 DIAGNOSIS — Z48812 Encounter for surgical aftercare following surgery on the circulatory system: Secondary | ICD-10-CM | POA: Diagnosis not present

## 2018-02-10 ENCOUNTER — Encounter (HOSPITAL_COMMUNITY)
Admission: RE | Admit: 2018-02-10 | Discharge: 2018-02-10 | Disposition: A | Discharge status: Home / Self Care | Payer: BLUE CROSS/BLUE SHIELD | Source: Ambulatory Visit | Attending: Cardiology | Admitting: Cardiology

## 2018-02-10 ENCOUNTER — Encounter (HOSPITAL_COMMUNITY): Payer: BLUE CROSS/BLUE SHIELD

## 2018-02-10 DIAGNOSIS — Z48812 Encounter for surgical aftercare following surgery on the circulatory system: Secondary | ICD-10-CM | POA: Diagnosis not present

## 2018-02-10 DIAGNOSIS — Z955 Presence of coronary angioplasty implant and graft: Secondary | ICD-10-CM | POA: Diagnosis not present

## 2018-02-10 DIAGNOSIS — I214 Non-ST elevation (NSTEMI) myocardial infarction: Secondary | ICD-10-CM

## 2018-02-12 ENCOUNTER — Encounter (HOSPITAL_COMMUNITY)
Admission: RE | Admit: 2018-02-12 | Discharge: 2018-02-12 | Disposition: A | Discharge status: Home / Self Care | Payer: BLUE CROSS/BLUE SHIELD | Source: Ambulatory Visit | Attending: Cardiology | Admitting: Cardiology

## 2018-02-12 ENCOUNTER — Encounter (HOSPITAL_COMMUNITY): Payer: BLUE CROSS/BLUE SHIELD

## 2018-02-12 DIAGNOSIS — I214 Non-ST elevation (NSTEMI) myocardial infarction: Secondary | ICD-10-CM | POA: Diagnosis not present

## 2018-02-12 DIAGNOSIS — Z48812 Encounter for surgical aftercare following surgery on the circulatory system: Secondary | ICD-10-CM | POA: Insufficient documentation

## 2018-02-12 DIAGNOSIS — Z955 Presence of coronary angioplasty implant and graft: Secondary | ICD-10-CM | POA: Insufficient documentation

## 2018-02-15 ENCOUNTER — Encounter (HOSPITAL_COMMUNITY)
Admission: RE | Admit: 2018-02-15 | Discharge: 2018-02-15 | Disposition: A | Discharge status: Home / Self Care | Payer: BLUE CROSS/BLUE SHIELD | Source: Ambulatory Visit | Attending: Cardiology | Admitting: Cardiology

## 2018-02-15 ENCOUNTER — Encounter (HOSPITAL_COMMUNITY): Payer: Self-pay

## 2018-02-15 ENCOUNTER — Encounter (HOSPITAL_COMMUNITY): Payer: BLUE CROSS/BLUE SHIELD

## 2018-02-15 DIAGNOSIS — Z955 Presence of coronary angioplasty implant and graft: Secondary | ICD-10-CM

## 2018-02-15 DIAGNOSIS — I214 Non-ST elevation (NSTEMI) myocardial infarction: Secondary | ICD-10-CM | POA: Diagnosis not present

## 2018-02-15 DIAGNOSIS — Z48812 Encounter for surgical aftercare following surgery on the circulatory system: Secondary | ICD-10-CM | POA: Diagnosis not present

## 2018-02-17 ENCOUNTER — Encounter (HOSPITAL_COMMUNITY): Payer: BLUE CROSS/BLUE SHIELD

## 2018-02-19 ENCOUNTER — Encounter (HOSPITAL_COMMUNITY): Payer: BLUE CROSS/BLUE SHIELD

## 2018-03-04 NOTE — Progress Notes (Signed)
Discharge Progress Report  Patient Details  Name: Carmen Anderson MRN: [id number] Date of Birth: 18-Jan-1961 Referring Provider:     [place] from 11/12/2017 in [place]  Referring Provider  Adrian Prows MD       Number of Visits: 36  Reason for Discharge:  Patient reached a stable level of exercise. Patient independent in their exercise. Patient has met program and personal goals.  Smoking History:  Social History   Tobacco Use  Smoking Status Former Smoker  . Packs/day: 0.50  . Years: 27.00  . Pack years: 13.50  . Types: Cigarettes  . Last attempt to quit: 09/05/2017  . Years since quitting: 0.4  Smokeless Tobacco Never Used    Diagnosis:  Status post coronary artery stent placement  NSTEMI (non-ST elevated myocardial infarction) ([place])  ADL [facility]:   Initial Exercise Prescription: Initial Exercise Prescription - 11/12/17 1100      Date of Initial Exercise RX and Referring Provider   Date  11/12/17    Referring Provider  Adrian Prows MD      Treadmill   MPH  2.7    Grade  0    Minutes  10    METs  3.07      Bike   Level  0.7    Minutes  10    METs  3.64      NuStep   Level  3    SPM  80    Minutes  10    METs  3      Prescription Details   Frequency (times per week)  3    Duration  Progress to 30 minutes of continuous aerobic without signs/symptoms of physical distress      Intensity   THRR 40-80% of Max Heartrate  66-132    Ratings of Perceived Exertion  11-13    Perceived Dyspnea  0-4      Progression   Progression  Continue to progress workloads to maintain intensity without signs/symptoms of physical distress.      Resistance Training   Training Prescription  Yes    Weight  3lbs    Reps  10-15       Discharge Exercise Prescription (Final Exercise Prescription Changes): Exercise Prescription Changes - 02/15/18 0900      Response to Exercise   Blood Pressure (Admit)  122/70     Blood Pressure (Exercise)  138/68    Blood Pressure (Exit)  110/60    Heart Rate (Admit)  63 bpm    Heart Rate (Exercise)  117 bpm    Heart Rate (Exit)  73 bpm    Rating of Perceived Exertion (Exercise)  12    Perceived Dyspnea (Exercise)  0    Symptoms  None    Duration  Continue with 45 min of aerobic exercise without signs/symptoms of physical distress.    Intensity  THRR unchanged      Progression   Progression  Continue to progress workloads to maintain intensity without signs/symptoms of physical distress.    Average METs  5.2      Resistance Training   Training Prescription  Yes    Weight  4lbs    Reps  10-15    Time  10 Minutes      Interval Training   Interval Training  No      Treadmill   MPH  3    Grade  3    Minutes  10    METs  4.54      NuStep   Level  5    SPM  110    Minutes  10    METs  4.4      Rower   Level  2    Watts  36    Minutes  10    METs  5.5      Home Exercise Plan   Plans to continue exercise at  Home (comment)   Home Treadmill    Frequency  Add 3 additional days to program exercise sessions.    Initial Home Exercises Provided  11/25/17       Functional Capacity: 6 Minute Walk    Row Name 11/12/17 1107 02/01/18 1437       6 Minute Walk   Phase  Initial  Discharge    Distance  1633 feet  1800 feet    Distance % Change  -  10.23 %    Distance Feet Change  -  167 ft    Walk Time  6 minutes  6 minutes    # of Rest Breaks  0  0    MPH  3.1  3.41    METS  4.5  5.2    RPE  11  11    Perceived Dyspnea   -  0    VO2 Peak  15.9  18.25    Symptoms  No  No    Resting HR  69 bpm  62 bpm    Resting BP  106/56  102/62    Resting Oxygen Saturation   100 %  -    Exercise Oxygen Saturation  during 6 min walk  100 %  -    Max Ex. HR  95 bpm  94 bpm    Max Ex. BP  124/60  120/62    2 Minute Post BP  112/60  104/70       Psychological, QOL, Others - Outcomes: PHQ 2/9: Depression screen [facility] 2/9 02/15/2018 01/11/2018 11/18/2017 11/12/2017  10/07/2017  Decreased Interest 0 0 0 0 0  Down, Depressed, Hopeless 0 0 0 0 0  PHQ - 2 Score 0 0 0 0 0    Quality of Life: Quality of Life - 02/15/18 0948      Quality of Life   Select  Quality of Life      Quality of Life Scores   Health/Function Pre  28 %    Health/Function Post  29.2 %    Health/Function % Change  4.29 %    Socioeconomic Pre  26.57 %    Socioeconomic Post  26.79 %    Socioeconomic % Change   0.83 %    Psych/Spiritual Pre  29.14 %    Psych/Spiritual Post  30 %    Psych/Spiritual % Change  2.95 %    Family Pre  30 %    Family Post  30 %    Family % Change  0 %    GLOBAL Pre  28.18 %    GLOBAL Post  29.95 %    GLOBAL % Change  6.28 %       Personal Goals: Goals established at orientation with interventions provided to work toward goal. Personal Goals and Risk Factors at Admission - 11/12/17 1139      Core Components/Risk Factors/Patient Goals on Admission    Weight Management  Yes;Weight Maintenance    Intervention  Weight Management: Develop a combined  nutrition and exercise program designed to reach desired caloric intake, while maintaining appropriate intake of nutrient and fiber, sodium and fats, and appropriate energy expenditure required for the weight goal.;Weight Management: Provide education and appropriate resources to help participant work on and attain dietary goals.;Weight Management/Obesity: Establish reasonable short term and long term weight goals.    Expected Outcomes  Short Term: Continue to assess and modify interventions until short term weight is achieved;Long Term: Adherence to nutrition and physical activity/exercise program aimed toward attainment of established weight goal;Weight Maintenance: Understanding of the daily nutrition guidelines, which includes 25-35% calories from fat, 7% or less cal from saturated fats, less than 24m cholesterol, less than 1.5gm of sodium, & 5 or more servings of fruits and vegetables daily;Weight Loss:  Understanding of general recommendations for a balanced deficit meal plan, which promotes 1-2 lb weight loss per week and includes a negative energy balance of [phone] kcal/d;Understanding recommendations for meals to include 15-35% energy as protein, 25-35% energy from fat, 35-60% energy from carbohydrates, less than 205mof dietary cholesterol, 20-35 gm of total fiber daily;Understanding of distribution of calorie intake throughout the day with the consumption of 4-5 meals/snacks    Diabetes  Yes    Intervention  Provide education about signs/symptoms and action to take for hypo/hyperglycemia.;Provide education about proper nutrition, including hydration, and aerobic/resistive exercise prescription along with prescribed medications to achieve blood glucose in normal ranges: Fasting glucose 65-99 mg/dL    Expected Outcomes  Short Term: Participant verbalizes understanding of the signs/symptoms and immediate care of hyper/hypoglycemia, proper foot care and importance of medication, aerobic/resistive exercise and nutrition plan for blood glucose control.;Long Term: Attainment of HbA1C < 7%.    Hypertension  Yes    Intervention  Provide education on lifestyle modifcations including regular physical activity/exercise, weight management, moderate sodium restriction and increased consumption of fresh fruit, vegetables, and low fat dairy, alcohol moderation, and smoking cessation.;Monitor prescription use compliance.    Expected Outcomes  Short Term: Continued assessment and intervention until BP is < 140/9042mG in hypertensive participants. < 130/48m60m in hypertensive participants with diabetes, heart failure or chronic kidney disease.;Long Term: Maintenance of blood pressure at goal levels.    Lipids  Yes    Intervention  Provide education and support for participant on nutrition & aerobic/resistive exercise along with prescribed medications to achieve LDL <70mg6mL >40mg.22mExpected Outcomes  Short  Term: Participant states understanding of desired cholesterol values and is compliant with medications prescribed. Participant is following exercise prescription and nutrition guidelines.;Long Term: Cholesterol controlled with medications as prescribed, with individualized exercise RX and with personalized nutrition plan. Value goals: LDL < 70mg, 57m> 40 mg.    Stress  Yes    Intervention  Offer individual and/or small group education and counseling on adjustment to heart disease, stress management and health-related lifestyle change. Teach and support self-help strategies.;Refer participants experiencing significant psychosocial distress to appropriate mental health specialists for further evaluation and treatment. When possible, include family members and significant others in education/counseling sessions.    Expected Outcomes  Short Term: Participant demonstrates changes in health-related behavior, relaxation and other stress management skills, ability to obtain effective social support, and compliance with psychotropic medications if prescribed.;Long Term: Emotional wellbeing is indicated by absence of clinically significant psychosocial distress or social isolation.        Personal Goals Discharge: Goals and Risk Factor Review    Row Name 11/18/17 1618 [id number] 01/20/18 0826 [id number]  Core Components/Risk Factors/Patient Goals Review   Personal Goals Review  Weight Management/Obesity;Diabetes;Lipids;Hypertension;Stress  Weight Management/Obesity;Diabetes;Lipids;Hypertension;Stress  Weight Management/Obesity;Diabetes;Lipids;Hypertension;Stress  Weight Management/Obesity;Diabetes;Lipids;Hypertension;Stress    Review  Pt with multiple CAD RF eager to participate in [place] is continuing to participate in CR exercise.  She feels that she is increasing the strength in her arms and legs.   Melaine is continuing to participate in CR exercise.  She is continuing to increase her  strength.   Rebeccah completed the CR Program with 36 sessions. She felt that she increased her strength and knowledged during the program.    Expected Outcomes  Pt with multiple CAD RF will continue to participate in CR exercise, diet, and lifestyle modification to reduce CV RF.   Pt with multiple CAD RF will continue to participate in CR exercise, diet, and lifestyle modification to reduce CV RF.   [place] continue to participate in CR exercise, diet, and lifestyle modification.  Brylea will continue to participate in CR exercise, diet, and lifestyle modification.  She has a treadmill and weights at home and plans to exercise with her husband.        Exercise Goals and Review: Exercise Goals    Row Name 11/12/17 0827             Exercise Goals   Increase Physical Activity  Yes       Intervention  Provide advice, education, support and counseling about physical activity/exercise needs.;Develop an individualized exercise prescription for aerobic and resistive training based on initial evaluation findings, risk stratification, comorbidities and participant's personal goals.       Expected Outcomes  Short Term: Attend rehab on a regular basis to increase amount of physical activity.;Long Term: Exercising regularly at least 3-5 days a week.;Long Term: Add in home exercise to make exercise part of routine and to increase amount of physical activity.       Increase Strength and Stamina  Yes increase walking tolerance; Increase tone in mid section       Intervention  Provide advice, education, support and counseling about physical activity/exercise needs.;Develop an individualized exercise prescription for aerobic and resistive training based on initial evaluation findings, risk stratification, comorbidities and participant's personal goals.       Expected Outcomes  Long Term: Improve cardiorespiratory fitness, muscular endurance and strength as measured by increased METs and functional capacity (6MWT);Short  Term: Perform resistance training exercises routinely during rehab and add in resistance training at home;Short Term: Increase workloads from initial exercise prescription for resistance, speed, and METs.       Able to understand and use rate of perceived exertion (RPE) scale  Yes       Intervention  Provide education and explanation on how to use RPE scale       Expected Outcomes  Short Term: Able to use RPE daily in rehab to express subjective intensity level;Long Term:  Able to use RPE to guide intensity level when exercising independently       Knowledge and understanding of Target Heart Rate Range (THRR)  Yes       Intervention  Provide education and explanation of THRR including how the numbers were predicted and where they are located for reference       Expected Outcomes  Short Term: Able to state/look up THRR;Long Term: Able to use THRR to govern intensity when exercising independently;Short Term: Able to use daily as guideline for intensity in rehab       Able to  check pulse independently  Yes       Intervention  Provide education and demonstration on how to check pulse in carotid and radial arteries.;Review the importance of being able to check your own pulse for safety during independent exercise       Expected Outcomes  Short Term: Able to explain why pulse checking is important during independent exercise;Long Term: Able to check pulse independently and accurately       Understanding of Exercise Prescription  Yes       Intervention  Provide education, explanation, and written materials on patient's individual exercise prescription       Expected Outcomes  Short Term: Able to explain program exercise prescription;Long Term: Able to explain home exercise prescription to exercise independently          Nutrition & Weight - Outcomes: Pre Biometrics - 11/12/17 1132      Pre Biometrics   % Body Fat  18.3 %      Post Biometrics - 02/01/18 1439       Post  Biometrics   Height  5'  4.25" (1.632 m)    Weight  50.7 kg    Waist Circumference  30 inches    Hip Circumference  34 inches    Waist to Hip Ratio  0.88 %    BMI (Calculated)  19.04    Triceps Skinfold  12 mm    % Body Fat  27.5 %    Grip Strength  28 kg    Flexibility  10 in    Single Leg Stand  25.53 seconds       Nutrition: Nutrition Therapy & Goals - 11/12/17 0927      Nutrition Therapy   Diet  Consistent Carb, Heart Healthy      Personal Nutrition Goals   Nutrition Goal  CBG concentrations in the normal range or as close to normal as is safely possible.      Intervention Plan   Intervention  Prescribe, educate and counsel regarding individualized specific dietary modifications aiming towards targeted core components such as weight, hypertension, lipid management, diabetes, heart failure and other comorbidities.    Expected Outcomes  Short Term Goal: Understand basic principles of dietary content, such as calories, fat, sodium, cholesterol and nutrients.;Long Term Goal: Adherence to prescribed nutrition plan.       Nutrition Discharge: Nutrition Assessments - 02/19/18 1037      MEDFICTS Scores   Pre Score  6    Post Score  --   pt did not return medficts survey      Education Questionnaire Score: Knowledge Questionnaire Score - 02/15/18 0946      Knowledge Questionnaire Score   Pre Score  22/24    Post Score  22/24       Goals reviewed with patient; copy given to patient.

## 2018-04-02 ENCOUNTER — Other Ambulatory Visit: Payer: Self-pay | Admitting: Family Medicine

## 2018-04-02 DIAGNOSIS — E7800 Pure hypercholesterolemia, unspecified: Secondary | ICD-10-CM

## 2018-04-02 NOTE — Telephone Encounter (Signed)
Lipitor 40mg  refill Last Refill: 09/08/17  # 90 Last OV: 01/11/18 PCP: Creta LevinStallings Pharmacy:[phone]

## 2018-04-08 DIAGNOSIS — Z23 Encounter for immunization: Secondary | ICD-10-CM | POA: Diagnosis not present

## 2018-04-13 ENCOUNTER — Ambulatory Visit: Payer: BLUE CROSS/BLUE SHIELD | Admitting: Internal Medicine

## 2018-04-13 ENCOUNTER — Encounter: Payer: Self-pay | Admitting: Internal Medicine

## 2018-04-13 VITALS — BP 98/60 | HR 61 | Ht 63.5 in | Wt 110.0 lb

## 2018-04-13 DIAGNOSIS — E1140 Type 2 diabetes mellitus with diabetic neuropathy, unspecified: Secondary | ICD-10-CM | POA: Diagnosis not present

## 2018-04-13 DIAGNOSIS — E1142 Type 2 diabetes mellitus with diabetic polyneuropathy: Secondary | ICD-10-CM

## 2018-04-13 DIAGNOSIS — E785 Hyperlipidemia, unspecified: Secondary | ICD-10-CM

## 2018-04-13 LAB — POCT GLYCOSYLATED HEMOGLOBIN (HGB A1C): Hemoglobin A1C: 6.9 % — AB (ref 4.0–5.6)

## 2018-04-13 NOTE — Patient Instructions (Signed)
Please continue: - Metformin ER 500 mg 2x a day with meals - Jardiance 10 mg before breakfast  Please return in 4 months with your sugar log. 

## 2018-04-13 NOTE — Progress Notes (Signed)
Patient ID: Carmen Anderson, female   DOB: 11-Aug-1960, 57 y.o.   MRN: [id number]  HPI: Carmen Anderson is a 57 y.o.-year-old female, initially referred by her PCP, Olean Ree, NP, for management of DM2, dx in 2001-2002, non-insulin-dependent, now more controlled, with complications (PN). Last visit 4 months ago.  She continues cardiac rehab after her NSTEMI in 08/2017.  At that time, she had a catheterization and had 90% blockage on LAD.  She had a stent placed.  Afterwards, cardiology suggested to start Jardiance, which we did.  She is tolerating it well.  Last hemoglobin A1c was: Lab Results  Component Value Date   HGBA1C 6.6 (A) 12/14/2017   HGBA1C 6.9 (H) 09/06/2017   HGBA1C 7 08/14/2017   Pt is on a regimen of: - Metformin ER 500 mg 2x a day, with meals (decreased from 3x a day Spring 2016) -tolerates this well - Januvia 100 mg daily in am >> Jardiance 10 mg before breakfast  Pt checks her sugars twice a day - reviewed her excellent log: - am:  76-98, 127 >> 96-139 >> 94-121 - 2h after b'fast:  89-154, 167 >> 111-159, 168 - before lunch:79-99, 119 >> 84-129 >> 89-122 - 2h after lunch: 144-173 >> 101-129, 158 >> 96-160 - before dinner:  86-120 >> 96-122 >> 92-120 - 2h after dinner:  149-179 >>107-163, 172 >> 119-164 - bedtime:  98-125, 178 >> 98-124 >> 96-126 - nighttime: n/c >> n/c Lowest sugar was 38 ... >>89; she has hypoglycemia awareness in the 50s. Highest sugar was 172 >> 168.  Glucometer: FreeStyle >> [facility]  Pt's meals are: - snack:  1/2 yoghurt, PB + bread - Breakfast: wheat waffle + sugar free syrup or wheat muffin w/ 1 egg  - Lunch: salads or [place] sandwich on wheat bread - Dinner: soups, salad, meat 2x a weekly (chicken, salmon) + veggies - Snacks: pineapple, celery  -No history of CKD, last BUN/creatinine:  Lab Results  Component Value Date   BUN 20 12/14/2017   CREATININE 1.07 (H) 12/14/2017  On lisinopril. -+ HL; last set of lipids: Lab Results   Component Value Date   CHOL 110 10/20/2017   HDL 43 10/20/2017   LDLCALC 52 10/20/2017   TRIG 74 10/20/2017   CHOLHDL 2.6 10/20/2017  On Lipitor, fish oil. - last eye exam was in 07/2017: No DR; [facility].  -+ Numbness and tingling in her feet.  Continues Neurontin and B complex.  She also has HTN, controlled hypothyroidism.  Last TSH normal: Lab Results  Component Value Date   TSH 1.473 09/05/2017   ROS: Constitutional: no weight gain/no weight loss, no fatigue, no subjective hyperthermia, no subjective hypothermia Eyes: no blurry vision, no xerophthalmia ENT: no sore throat, no nodules palpated in throat, no dysphagia, no odynophagia, no hoarseness Cardiovascular: no CP/no SOB/no palpitations/no leg swelling Respiratory: no cough/no SOB/no wheezing Gastrointestinal: no N/no V/no D/no C/no acid reflux Musculoskeletal: no muscle aches/no joint aches Skin: no rashes, no hair loss Neurological: no tremors/+ numbness/+ tingling/no dizziness  I reviewed pt's medications, allergies, PMH, social hx, family hx, and changes were documented in the history of present illness. Otherwise, unchanged from my initial visit note.  Past Medical History:  Diagnosis Date  . DM type 2 (diabetes mellitus, type 2) (HCC)   . History of hiatal hernia   . Hyperlipidemia   . Hypertension   . Hypothyroidism    Past Surgical History:  Procedure Laterality Date  . ABDOMINAL HYSTERECTOMY  1995  . CARDIAC CATHETERIZATION    . CORONARY STENT INTERVENTION N/A 09/07/2017   Procedure: CORONARY STENT INTERVENTION;  Surgeon: Runell Gess, MD;  Location: [facility];  Service: Cardiovascular;  Laterality: N/A;  . LEFT HEART CATH AND CORONARY ANGIOGRAPHY N/A 09/07/2017   Procedure: LEFT HEART CATH AND CORONARY ANGIOGRAPHY;  Surgeon: Runell Gess, MD;  Location: [facility];  Service: Cardiovascular;  Laterality: N/A;  . STAPEDECTOMY Right ~ 2002   Social History   Social History   . Marital Status: Married    Spouse Name: N/A  . Number of Children: 0  . Years of Education: 12   Occupational History  . property asst. Production designer, theatre/television/film    Social History Main Topics  . Smoking status: Current Every Day Smoker -- 0.50 packs/day for 10 years    Types: Cigarettes  . Smokeless tobacco: Not on file     Comment: pt states thinking of quitting  . Alcohol Use: No  . Drug Use: No   Social History Narrative   Exercise: walking, 4 times/week for 30 minutes.   Current Outpatient Medications on File Prior to Visit  Medication Sig Dispense Refill  . aspirin EC 81 MG EC tablet Take 1 tablet (81 mg total) by mouth daily.    [name] atorvastatin (LIPITOR) 40 MG tablet TAKE 1 TABLET BY MOUTH EVERY DAY 90 tablet 3  . atorvastatin (LIPITOR) 80 MG tablet Take 1 tablet (80 mg total) by mouth daily at 6 PM. 90 tablet 3  . empagliflozin (JARDIANCE) 10 MG TABS tablet Take 10 mg by mouth daily. 90 mg 3  . fluticasone (FLONASE) 50 MCG/ACT nasal spray Place 2 sprays into both nostrils daily. 16 g 6  . gabapentin (NEURONTIN) 100 MG capsule TAKE 1 CAPSULE (100 MG TOTAL) BY MOUTH AT BEDTIME. 90 capsule 3  . levothyroxine (SYNTHROID, LEVOTHROID) 50 MCG tablet TAKE 1 TABLET BY MOUTH EVERY DAY 90 tablet 3  . metFORMIN (GLUCOPHAGE-XR) 500 MG 24 hr tablet TAKE 1 TABLET BY MOUTH TWICE DAILY WITH MEALS AS DIRECTED 180 tablet 3  . nitroGLYCERIN (NITROSTAT) 0.4 MG SL tablet Place 1 tablet (0.4 mg total) under the tongue every 5 (five) minutes x 3 doses as needed for chest pain. 25 tablet 3  . ticagrelor (BRILINTA) 90 MG TABS tablet Take 1 tablet (90 mg total) by mouth 2 (two) times daily. 180 tablet 3  . lisinopril (PRINIVIL,ZESTRIL) 5 MG tablet Take 1 tablet (5 mg total) by mouth daily. 90 tablet 3   No current facility-administered medications on file prior to visit.    Allergies  Allergen Reactions  . Aspirin Other (See Comments)    Burns stomach  . Codeine Rash   Family History  Problem Relation Age of  Onset  . Diabetes Mother   . Heart disease Mother   . Stroke Father        2011  . Hyperlipidemia Father   . Diabetes Sister   . Heart disease Sister   . Heart disease Brother        congenital   PE: BP 98/60   Pulse 61   Ht 5' 3.5" (1.613 m) Comment: measured  Wt 110 lb (49.9 kg)   SpO2 98%   BMI 19.18 kg/m  Body mass index is 19.18 kg/m. Wt Readings from Last 3 Encounters:  04/13/18 110 lb (49.9 kg)  02/01/18 111 lb 12.4 oz (50.7 kg)  01/11/18 110 lb 9.6 oz (50.2 kg)   Constitutional: Normal  weight, in NAD Eyes: PERRLA, EOMI, no exophthalmos ENT: moist mucous membranes, no thyromegaly, no cervical lymphadenopathy Cardiovascular: RRR, No MRG Respiratory: CTA B Gastrointestinal: abdomen soft, NT, ND, BS+ Musculoskeletal: no deformities, strength intact in all 4 Skin: moist, warm, no rashes Neurological: no tremor with outstretched hands, DTR normal in all 4  ASSESSMENT: 1. DM2, non-insulin-dependent, now more controlled, with complications - PN   2. PN   3. HL  PLAN:  1. Patient with long-standing, uncontrolled, type 2 diabetes, with improved control in the last year.  She had an NSTEMI in 08/2017 and had a stent placed.  We started Jardiance after this.  No side effects.  She stays well hydrated.  She also continues to exercise even after finishing cardiac rehab: Either walking outside or on treadmill/using a stepper. - At this visit, per review of her excellent log, her sugars are almost entirely at goal, with few exceptions. - No changes are needed in her regimen for now. - I suggested to:  Patient Instructions  Please continue: - Metformin ER 500 mg 2x a day with meals - Jardiance 10 mg before breakfast  Please return in 4 months with your sugar log.   - today, HbA1c is 6.9% (slightly higher) - continue checking sugars at different times of the day - check 1x a day, rotating checks - advised for yearly eye exams >> she is UTD - Up-to-date with flu shot -  Return to clinic in 4 mo with sugar log      2. Diabetic PN -No new complaints, stable -She continues on: -B complex 1 tablet daily -Neurontin 100 mg daily  3. HL - Reviewed latest lipid panel from 10/2017: Excellent Lab Results  Component Value Date   CHOL 110 10/20/2017   HDL 43 10/20/2017   LDLCALC 52 10/20/2017   TRIG 74 10/20/2017   CHOLHDL 2.6 10/20/2017  - Continues statin and fish oil without side effects.  Carlus Pavlov, MD PhD [facility] Endocrinology

## 2018-05-20 ENCOUNTER — Ambulatory Visit: Payer: BLUE CROSS/BLUE SHIELD | Admitting: Family Medicine

## 2018-06-15 ENCOUNTER — Encounter: Payer: Self-pay | Admitting: Cardiovascular Disease

## 2018-06-15 ENCOUNTER — Ambulatory Visit: Payer: BLUE CROSS/BLUE SHIELD | Admitting: Cardiovascular Disease

## 2018-06-15 VITALS — BP 92/61 | HR 60 | Ht 63.5 in | Wt 108.6 lb

## 2018-06-15 DIAGNOSIS — I2510 Atherosclerotic heart disease of native coronary artery without angina pectoris: Secondary | ICD-10-CM

## 2018-06-15 DIAGNOSIS — E119 Type 2 diabetes mellitus without complications: Secondary | ICD-10-CM | POA: Diagnosis not present

## 2018-06-15 DIAGNOSIS — E039 Hypothyroidism, unspecified: Secondary | ICD-10-CM | POA: Diagnosis not present

## 2018-06-15 DIAGNOSIS — Z87891 Personal history of nicotine dependence: Secondary | ICD-10-CM

## 2018-06-15 DIAGNOSIS — E785 Hyperlipidemia, unspecified: Secondary | ICD-10-CM | POA: Diagnosis not present

## 2018-06-15 MED ORDER — ATORVASTATIN CALCIUM 80 MG PO TABS: 80.0000 mg | ORAL_TABLET | Freq: Two times a day (BID) | ORAL | 3 refills | Status: DC

## 2018-06-15 MED ORDER — LISINOPRIL 2.5 MG PO TABS: 2.5000 mg | ORAL_TABLET | Freq: Every day | ORAL | 3 refills | Status: DC

## 2018-06-15 NOTE — Patient Instructions (Signed)
Medication Instructions:  DECREASE lisinopril to 2.5 mg daily  If you need a refill on your cardiac medications before your next appointment, please call your pharmacy.   Follow-Up: At [facility], you and your health needs are our priority.  As part of our continuing mission to provide you with exceptional heart care, we have created designated Provider Care Teams.  These Care Teams include your primary Cardiologist (physician) and Advanced Practice Providers (APPs -  Physician Assistants and Nurse Practitioners) who all work together to provide you with the care you need, when you need it. You will need a follow up appointment in 6 months.  Please call our office 2 months in advance to schedule this appointment.  You may see Nicki Guadalajarahomas Kelly, MD or one of the following Advanced Practice Providers on your designated Care Team: ClaxtonHao Meng, [place] JerseyPA-C . Micah FlesherAngela Duke, PA-C

## 2018-06-15 NOTE — Progress Notes (Signed)
Cardiology Office Note    Date:  06/17/2018   ID:  Carmen Anderson, DOB [birth year], MRN [id number]  PCP:  Forrest Moron, MD  Cardiologist:  Shelva Majestic, MD   Chief Complaint  Patient presents with  . Follow-up    History of Present Illness:  Carmen Anderson is a 57 y.o. female who has a history of hyperlipidemia, diabetes mellitus, hypothyroidism from a non-ST segment elevation MI.  She presents for a 70-monthfollow-up cardiology evaluation/.  [name] hospitalized on September 04, 2017 after developing chest pain with left arm radiation associated with shortness of breath.  I had seen her for cardiology consultation during her evaluation.  She had smoked for approximately 10 years, quit for 5 years, and then had been smoking for another 10 years.  She ultimately ruled in for non-ST segment MI.  A nuclear study showed a medium defect of moderate severity in the mid anteroseptal apical anterior apical septal location.  An echo Doppler study showed an EF of 60 to 65%.  She underwent cardiac catheterization of fiber 25 2019 which showed a 90% proximal to mid LAD stenosis successfully treated with DES stenting by Dr. BGwenlyn Foundand a 2.25 x 16 mm Synergy stent was inserted.  Subsequently, she stabilized.  Initial LDL cholesterol was notable for 191.  She was on atorvastatin 80 mg daily for hyperlipidemia and 3 weeks ago repeat laboratory showed an LDL cholesterol improved to 52.  She is diabetic on Jardiance 10 mg in addition to metformin.  She has been on aspirin and Brilinta for dual antiplatelet therapy.  She is on lisinopril 5 mg daily.  She has hypothyroidism on levothyroxine at 50 mcg.  She is on Jardiance in addition to metformin for diabetes mellitus.  When I saw her in May 2019 she was doing well and was about to start cardiac rehab.  She denies any recurrent episodes of chest pain.  Her previous exertional dyspnea has improved.  She continues to work at [employer]  At times she  has noticed some mild lightheadedness.  She denies palpitations.  She presents for evaluation.   Past Medical History:  Diagnosis Date  . DM type 2 (diabetes mellitus, type 2) [place]   . History of hiatal hernia   . Hyperlipidemia   . Hypertension   . Hypothyroidism     Past Surgical History:  Procedure Laterality Date  . ABDOMINAL HYSTERECTOMY  1995  . CARDIAC CATHETERIZATION    . CORONARY STENT INTERVENTION N/A 09/07/2017   Procedure: CORONARY STENT INTERVENTION;  Surgeon: BLorretta Harp MD;  [facility];  Service: Cardiovascular;  Laterality: N/A;  . LEFT HEART CATH AND CORONARY ANGIOGRAPHY N/A 09/07/2017   Procedure: LEFT HEART CATH AND CORONARY ANGIOGRAPHY;  Surgeon: BLorretta Harp MD;  [facility];  Service: Cardiovascular;  Laterality: N/A;  . STAPEDECTOMY Right ~ 2002    Current Medications: Outpatient Medications Prior to Visit  Medication Sig Dispense Refill  . aspirin EC 81 MG EC tablet Take 1 tablet (81 mg total) by mouth daily.    . empagliflozin (JARDIANCE) 10 MG TABS tablet Take 10 mg by mouth daily. 90 mg 3  . fluticasone (FLONASE) 50 MCG/ACT nasal spray Place 2 sprays into both nostrils daily. 16 g 6  . gabapentin (NEURONTIN) 100 MG capsule TAKE 1 CAPSULE (100 MG TOTAL) BY MOUTH AT BEDTIME. 90 capsule 3  . levothyroxine (SYNTHROID, LEVOTHROID) 50 MCG tablet TAKE 1  TABLET BY MOUTH EVERY DAY 90 tablet 3  . metFORMIN (GLUCOPHAGE-XR) 500 MG 24 hr tablet TAKE 1 TABLET BY MOUTH TWICE DAILY WITH MEALS AS DIRECTED 180 tablet 3  . nitroGLYCERIN (NITROSTAT) 0.4 MG SL tablet Place 1 tablet (0.4 mg total) under the tongue every 5 (five) minutes x 3 doses as needed for chest pain. 25 tablet 3  . ticagrelor (BRILINTA) 90 MG TABS tablet Take 1 tablet (90 mg total) by mouth 2 (two) times daily. 180 tablet 3  . atorvastatin (LIPITOR) 40 MG tablet TAKE 1 TABLET BY MOUTH EVERY DAY 90 tablet 3  . atorvastatin (LIPITOR) 40 MG tablet Take 40 mg by  mouth 2 (two) times daily.    [name] atorvastatin (LIPITOR) 80 MG tablet Take 1 tablet (80 mg total) by mouth daily at 6 PM. 90 tablet 3  . lisinopril (PRINIVIL,ZESTRIL) 5 MG tablet Take 1 tablet (5 mg total) by mouth daily. 90 tablet 3   No facility-administered medications prior to visit.      Allergies:   Aspirin and Codeine   Social History   Socioeconomic History  . Marital status: Married    Spouse name: Not on file  . Number of children: Not on file  . Years of education: 29  . Highest education level: Not on file  Occupational History  . 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 Financial resource strain: Not on file  . Food insecurity:    Worry: Not on file    Inability: Not on file  . Transportation needs:    Medical: Not on file    Non-medical: Not on file  Tobacco Use  . Smoking status: Former Smoker    Packs/day: 0.50    Years: 27.00    Pack years: 13.50    Types: Cigarettes    Last attempt to quit: 09/05/2017    Years since quitting: 0.7  . Smokeless tobacco: Never Used  Substance and Sexual Activity  . Alcohol use: No  . Drug use: No  . Sexual activity: Yes    Birth control/protection: None  Lifestyle  . Physical activity:    Days per week: Not on file    Minutes per session: Not on file  . Stress: Not on file  Relationships  . Social connections:    Talks on phone: Not on file    Gets together: Not on file    Attends religious service: Not on file    Active member of club or organization: Not on file    Attends meetings of clubs or organizations: Not on file    Relationship status: Not on file  Other Topics Concern  . Not on file  Social History Narrative   Exercise: walking, 4 times/week for 30 minutes.    Quit tobacco on September 04, 2017  Family History:  The patient's family history includes Diabetes in her mother and sister; Heart disease in her brother, mother, and sister; Hyperlipidemia  in her father; Stroke in her father.   ROS General: Negative; No fevers, chills, or night sweats;  HEENT: Negative; No changes in vision or hearing, sinus congestion, difficulty swallowing Pulmonary: Negative; No cough, wheezing, shortness of breath, hemoptysis Cardiovascular: See HPI GI: Negative; No nausea, vomiting, diarrhea, or abdominal pain GU: Negative; No dysuria, hematuria, or difficulty voiding Musculoskeletal: Negative; no myalgias, joint pain, or weakness Hematologic/Oncology: Negative; no easy bruising, bleeding Endocrine: Negative; no heat/cold intolerance; no diabetes Neuro: Negative; no changes  in balance, headaches Skin: Negative; No rashes or skin lesions Psychiatric: Negative; No behavioral problems, depression Sleep: Negative; No snoring, daytime sleepiness, hypersomnolence, bruxism, restless legs, hypnogognic hallucinations, no cataplexy Other comprehensive 14 point system review is negative.   PHYSICAL EXAM:   VS:  BP 92/61   Pulse 60   Ht 5' 3.5" (1.613 m)   Wt 108 lb 9.6 oz (49.3 kg)   BMI 18.94 kg/m     Repeat blood pressure by me was 114/64 supine and 96/60 standing. Wt Readings from Last 3 Encounters:  06/15/18 108 lb 9.6 oz (49.3 kg)  04/13/18 110 lb (49.9 kg)  02/01/18 111 lb 12.4 oz (50.7 kg)    General: Alert, oriented, no distress.  Skin: normal turgor, no rashes, warm and dry HEENT: Normocephalic, atraumatic. Pupils equal round and reactive to light; sclera anicteric; extraocular muscles intact;  Nose without nasal septal hypertrophy Mouth/Parynx benign; Mallinpatti scale 3 Neck: No JVD, no carotid bruits; normal carotid upstroke Lungs: clear to ausculatation and percussion; no wheezing or rales Chest wall: without tenderness to palpitation Heart: PMI not displaced, RRR, s1 s2 normal, 1/6 systolic murmur, no diastolic murmur, no rubs, gallops, thrills, or heaves Abdomen: soft, nontender; no hepatosplenomehaly, BS+; abdominal aorta nontender  and not dilated by palpation. Back: no CVA tenderness Pulses 2+ Musculoskeletal: full range of motion, normal strength, no joint deformities Extremities: no clubbing cyanosis or edema, Homan's sign negative  Neurologic: grossly nonfocal; Cranial nerves grossly wnl Psychologic: Normal mood and affect   Studies/Labs Reviewed:   EKG:  EKG is ordered today.  ECG (independently read by me): Sinus rhythm at 60 bpm.  No ectopy.  Normal intervals.  Nov 16, 2017 ECG (independently read by me): Sinus bradycardia 55 bpm.  No ST segment changes.  No ectopy.  Recent Labs: BMP Latest Ref Rng & Units 12/14/2017 09/08/2017 09/05/2017  Glucose 65 - 99 mg/dL 133(H) 139(H) 212(H)  BUN 7 - 25 mg/dL '20 12 16  '$ Creatinine 0.50 - 1.05 mg/dL 1.07(H) 0.66 0.82  BUN/Creat Ratio 6 - 22 (calc) 19 - -  Sodium 135 - 146 mmol/L 143 139 136  Potassium 3.5 - 5.3 mmol/L 4.5 4.4 5.2(H)  Chloride 98 - 110 mmol/L 109 110 106  CO2 20 - 32 mmol/L 20 22 21(L)  Calcium 8.6 - 10.4 mg/dL 9.8 9.2 9.2     Hepatic Function Latest Ref Rng & Units 10/20/2017 01/02/2017 07/10/2016  Total Protein 6.0 - 8.5 g/dL 7.1 7.1 7.1  Albumin 3.5 - 5.5 g/dL 5.0 4.6 4.8  AST 0 - 40 IU/L '19 15 12  '$ ALT 0 - 32 IU/L '26 12 14  '$ Alk Phosphatase 39 - 117 IU/L 69 58 57  Total Bilirubin 0.0 - 1.2 mg/dL 0.6 0.3 0.3  Bilirubin, Direct 0.00 - 0.40 mg/dL 0.14 - -    CBC Latest Ref Rng & Units 09/08/2017 09/07/2017 09/06/2017  WBC 4.0 - 10.5 K/uL 11.9(H) 11.8(H) 12.6(H)  Hemoglobin 12.0 - 15.0 g/dL 13.6 13.5 13.0  Hematocrit 36.0 - 46.0 % 41.2 41.8 39.6  Platelets 150 - 400 K/uL 264 302 278   Lab Results  Component Value Date   MCV 92.2 09/08/2017   MCV 92.9 09/07/2017   MCV 92.1 09/06/2017   Lab Results  Component Value Date   TSH 1.473 09/05/2017   Lab Results  Component Value Date   HGBA1C 6.9 (A) 04/13/2018     BNP No results found for: BNP  ProBNP No results found for: PROBNP   Lipid  Panel     Component Value Date/Time   CHOL  110 10/20/2017 0818   TRIG 74 10/20/2017 0818   HDL 43 10/20/2017 0818   CHOLHDL 2.6 10/20/2017 0818   CHOLHDL 5.6 09/05/2017 0259   VLDL 22 09/05/2017 0259   LDLCALC 52 10/20/2017 0818     RADIOLOGY: No results found.   Additional studies/ records that were reviewed today include:  I reviewed her [facility], cardiac catheterization report, echo Doppler study, subsequent office visits and laboratory.   ASSESSMENT:    1. Coronary artery disease involving native coronary artery of native heart without angina pectoris   2. Hyperlipidemia with target LDL less than 70   3. Controlled type 2 diabetes mellitus without complication, without long-term current use of insulin ([place])   4. Hypothyroidism, unspecified type   5. Former tobacco use      PLAN:  Carmen Anderson is a 57 year old female who suffered a non-ST segment elevation myocardial infarction in February 2019 and ultimately was found to have high-grade proximal to mid 90% LAD stenosis which was successfully stented with a synergy DES stent.  She has a long-standing history of prior diabetes mellitus, hypertension, hyperlipidemia, and tobacco use.  Fortunately she quit smoking on her day of admission.  She has been without recurrent anginal symptomatology since intervention.  On exam today she has mild orthostatic hypotension with blood pressure dropping to 96/60 while standing.  She has been on lisinopril 5 mg daily and I have suggested she reduce this down to 2.5 mg.  She has been taking Lipitor 40 mg twice a day.  I am changing her back to the 80 mg prescription to take once daily.  Target LDL is less than 70.  Lipid studies in April 2019 had dramatically improved from 1 year previously when her LDL cholesterol was 201 down to 52.  She is tolerating atorvastatin.  She continues to be on aspirin and Brilinta.  There is no bleeding.  She is on levothyroxine at 50 mcg.  Most recent TSH was 1.47 in February 2019.  She is  diabetic on metformin.  She will monitor her blood pressure at home.  As long as she remains stable I will see her in 6 months for reevaluation.  Time spent: 25 minutes Medication Adjustments/Labs and Tests Ordered: Current medicines are reviewed at length with the patient today.  Concerns regarding medicines are outlined above.  Medication changes, Labs and Tests ordered today are listed in the Patient Instructions below. Patient Instructions  Medication Instructions:  DECREASE lisinopril to 2.5 mg daily  If you need a refill on your cardiac medications before your next appointment, please call your pharmacy.   Follow-Up: At [facility], you and your health needs are our priority.  As part of our continuing mission to provide you with exceptional heart care, we have created designated Provider Care Teams.  These Care Teams include your primary Cardiologist (physician) and Advanced Practice Providers (APPs -  Physician Assistants and Nurse Practitioners) who all work together to provide you with the care you need, when you need it. You will need a follow up appointment in 6 months.  Please call our office 2 months in advance to schedule this appointment.  You may see Shelva Majestic, MD or one of the following Advanced Practice Providers on your designated Care Team: [place], [place] . Fabian Sharp, PA-C     Signed, Shelva Majestic, MD  06/17/2018 5:52 PM    [address], [place], [place], [place]  [address] Phone: [phone]

## 2018-06-17 ENCOUNTER — Encounter: Payer: Self-pay | Admitting: Cardiovascular Disease

## 2018-06-19 ENCOUNTER — Ambulatory Visit: Payer: BLUE CROSS/BLUE SHIELD | Admitting: Family Medicine

## 2018-06-19 ENCOUNTER — Encounter: Payer: Self-pay | Admitting: Family Medicine

## 2018-06-19 ENCOUNTER — Other Ambulatory Visit: Payer: Self-pay

## 2018-06-19 VITALS — BP 98/61 | HR 56 | Temp 98.0°F | Resp 12 | Ht 63.0 in | Wt 109.6 lb

## 2018-06-19 DIAGNOSIS — Z5181 Encounter for therapeutic drug level monitoring: Secondary | ICD-10-CM | POA: Diagnosis not present

## 2018-06-19 DIAGNOSIS — E034 Atrophy of thyroid (acquired): Secondary | ICD-10-CM

## 2018-06-19 DIAGNOSIS — E1159 Type 2 diabetes mellitus with other circulatory complications: Secondary | ICD-10-CM

## 2018-06-19 DIAGNOSIS — Z87891 Personal history of nicotine dependence: Secondary | ICD-10-CM

## 2018-06-19 DIAGNOSIS — I9589 Other hypotension: Secondary | ICD-10-CM

## 2018-06-19 NOTE — Progress Notes (Signed)
Established Patient Office Visit  Subjective:  Patient ID: Carmen Anderson, female    DOB: [birth year]  Age: 57 y.o. MRN: [id number]  CC:  Chief Complaint  Patient presents with  . Follow-up    Cardiology cut lisinopril form 5 to 2.70m due to low bp     HPI Carmen Anderson for  Hypotension She saw Cardiology on 06/15/2018 and was noted to have iatrogenic hypotension  She states that she was instructed cut her lisinopril 5656min half She states that her dose was adjusted to lisinopril 2.56m1mhe states that she does not smoke, she sticks to a diabetic exercise and she has been doing very well BP Readings from Last 3 Encounters:  06/19/18 98/61  06/15/18 92/61  04/13/18 98/60   Diabetes Mellitus: Patient presents for follow up of diabetes. Symptoms: none. Symptoms have stabilized. Patient denies foot ulcerations, hyperglycemia, hypoglycemia , increase appetite, nausea and paresthesia of the feet.  Evaluation to date has been included: hemoglobin A1C.  Home sugars: patient does not check sugars. Treatment to date: Continued metformin which has been effective.  She reports that she gets some hypoglycemia once in a while if she does not eat in the morning. For instance she was fasting for labs and her sugars got really low.  Lab Results  Component Value Date   HGBA1C 6.9 (A) 04/13/2018    WEIGHT LOSS and GRIEVING Body mass index is 19.41 kg/m. She reports some weight loss because of her diet and grieving She states that her father died in Oct11-07-2022 she has not had much appetite and was mostly snacking on the foods friends and family brought her Her family is here to support her and trying to make sure she does not go back to cigarettes. Wt Readings from Last 3 Encounters:  06/19/18 109 lb 9.6 oz (49.7 kg)  06/15/18 108 lb 9.6 oz (49.3 kg)  04/13/18 110 lb (49.9 kg)     Past Medical History:  Diagnosis Date  . DM type 2 (diabetes mellitus, type 2) ([facility] . History of  hiatal hernia   . Hyperlipidemia   . Hypertension   . Hypothyroidism     Past Surgical History:  Procedure Laterality Date  . ABDOMINAL HYSTERECTOMY  1995  . CARDIAC CATHETERIZATION    . CORONARY STENT INTERVENTION N/A 09/07/2017   Procedure: CORONARY STENT INTERVENTION;  Surgeon: BerLorretta HarpD;  Location: [facility];  Service: Cardiovascular;  Laterality: N/A;  . LEFT HEART CATH AND CORONARY ANGIOGRAPHY N/A 09/07/2017   Procedure: LEFT HEART CATH AND CORONARY ANGIOGRAPHY;  Surgeon: BerLorretta HarpD;  Location: [facility];  Service: Cardiovascular;  Laterality: N/A;  . STAPEDECTOMY Right ~ 2002    Family History  Problem Relation Age of Onset  . Diabetes Mother   . Heart disease Mother   . Stroke Father        2011  . Hyperlipidemia Father   . Diabetes Sister   . Heart disease Sister   . Heart disease Brother        congenital    Social History   Socioeconomic History  . Marital status: Married    Spouse name: Not on file  . Number of children: Not on file  . Years of education: 12 76 Highest education level: Not on file  Occupational History  . 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 Financial  resource strain: Not on file  . Food insecurity:    Worry: Not on file    Inability: Not on file  . Transportation needs:    Medical: Not on file    Non-medical: Not on file  Tobacco Use  . Smoking status: Former Smoker    Packs/day: 0.50    Years: 27.00    Pack years: 13.50    Types: Cigarettes    Last attempt to quit: 09/05/2017    Years since quitting: 0.7  . Smokeless tobacco: Never Used  Substance and Sexual Activity  . Alcohol use: No  . Drug use: No  . Sexual activity: Yes    Birth control/protection: None  Lifestyle  . Physical activity:    Days per week: Not on file    Minutes per session: Not on file  . Stress: Not on file  Relationships  . Social connections:    Talks  on phone: Not on file    Gets together: Not on file    Attends religious service: Not on file    Active member of club or organization: Not on file    Attends meetings of clubs or organizations: Not on file    Relationship status: Not on file  . Intimate partner violence:    Fear of current or ex partner: Not on file    Emotionally abused: Not on file    Physically abused: Not on file    Forced sexual activity: Not on file  Other Topics Concern  . Not on file  Social History Narrative   Exercise: walking, 4 times/week for 30 minutes.    Outpatient Medications Prior to Visit  Medication Sig Dispense Refill  . aspirin EC 81 MG EC tablet Take 1 tablet (81 mg total) by mouth daily.    [name] atorvastatin (LIPITOR) 80 MG tablet Take 1 tablet (80 mg total) by mouth 2 (two) times daily. 90 tablet 3  . empagliflozin (JARDIANCE) 10 MG TABS tablet Take 10 mg by mouth daily. 90 mg 3  . fluticasone (FLONASE) 50 MCG/ACT nasal spray Place 2 sprays into both nostrils daily. 16 g 6  . gabapentin (NEURONTIN) 100 MG capsule TAKE 1 CAPSULE (100 MG TOTAL) BY MOUTH AT BEDTIME. 90 capsule 3  . levothyroxine (SYNTHROID, LEVOTHROID) 50 MCG tablet TAKE 1 TABLET BY MOUTH EVERY DAY 90 tablet 3  . lisinopril (PRINIVIL,ZESTRIL) 2.5 MG tablet Take 1 tablet (2.5 mg total) by mouth daily. 90 tablet 3  . metFORMIN (GLUCOPHAGE-XR) 500 MG 24 hr tablet TAKE 1 TABLET BY MOUTH TWICE DAILY WITH MEALS AS DIRECTED 180 tablet 3  . nitroGLYCERIN (NITROSTAT) 0.4 MG SL tablet Place 1 tablet (0.4 mg total) under the tongue every 5 (five) minutes x 3 doses as needed for chest pain. 25 tablet 3  . ticagrelor (BRILINTA) 90 MG TABS tablet Take 1 tablet (90 mg total) by mouth 2 (two) times daily. 180 tablet 3   No facility-administered medications prior to visit.     Allergies  Allergen Reactions  . Aspirin Other (See Comments)    Burns stomach  . Codeine Rash    ROS Review of Systems Review of Systems  Constitutional: Negative  for activity change, appetite change, chills and fever.  HENT: Negative for congestion, nosebleeds, trouble swallowing and voice change.   Respiratory: Negative for cough, shortness of breath and wheezing.   Gastrointestinal: Negative for diarrhea, nausea and vomiting.  Genitourinary: Negative for difficulty urinating, dysuria, flank pain and hematuria.  Musculoskeletal: Negative for  back pain, joint swelling and neck pain.  Neurological: Negative for dizziness, speech difficulty, light-headedness and numbness.  See HPI. All other review of systems negative.     Objective:    Physical Exam  BP 98/61 (BP Location: Right Arm, Patient Position: Sitting, Cuff Size: Normal)   Pulse (!) 56   Temp 98 F (36.7 C) (Oral)   Resp 12   Ht 5' 3" (1.6 m)   Wt 109 lb 9.6 oz (49.7 kg)   SpO2 100%   BMI 19.41 kg/m  Wt Readings from Last 3 Encounters:  06/19/18 109 lb 9.6 oz (49.7 kg)  06/15/18 108 lb 9.6 oz (49.3 kg)  04/13/18 110 lb (49.9 kg)   Physical Exam  Constitutional: Oriented to person, place, and time. Appears well-developed and well-nourished.  HENT:  Head: Normocephalic and atraumatic.  Eyes: Conjunctivae and EOM are normal.  Cardiovascular: Normal rate, regular rhythm, normal heart sounds and intact distal pulses.  No murmur heard. Pulmonary/Chest: Effort normal and breath sounds normal. No stridor. No respiratory distress. Has no wheezes.  Neurological: Is alert and oriented to person, place, and time.  Skin: Skin is warm. Capillary refill takes less than 2 seconds.  Psychiatric: Has a normal mood and affect. Behavior is normal. Judgment and thought content normal.   There are no preventive care reminders to display for this patient.  There are no preventive care reminders to display for this patient.  Lab Results  Component Value Date   TSH 1.473 09/05/2017   Lab Results  Component Value Date   WBC 11.9 (H) 09/08/2017   HGB 13.6 09/08/2017   HCT 41.2 09/08/2017    MCV 92.2 09/08/2017   PLT 264 09/08/2017   Lab Results  Component Value Date   NA 143 12/14/2017   K 4.5 12/14/2017   CO2 20 12/14/2017   GLUCOSE 133 (H) 12/14/2017   BUN 20 12/14/2017   CREATININE 1.07 (H) 12/14/2017   BILITOT 0.6 10/20/2017   ALKPHOS 69 10/20/2017   AST 19 10/20/2017   ALT 26 10/20/2017   PROT 7.1 10/20/2017   ALBUMIN 5.0 10/20/2017   CALCIUM 9.8 12/14/2017   ANIONGAP 7 09/08/2017   Lab Results  Component Value Date   CHOL 110 10/20/2017   Lab Results  Component Value Date   HDL 43 10/20/2017   Lab Results  Component Value Date   LDLCALC 52 10/20/2017   Lab Results  Component Value Date   TRIG 74 10/20/2017   Lab Results  Component Value Date   CHOLHDL 2.6 10/20/2017   Lab Results  Component Value Date   HGBA1C 6.9 (A) 04/13/2018      Assessment & Plan:   Problem List Items Addressed This Visit      Cardiovascular and Mediastinum   Diabetes mellitus with cardiac complication ([place])    Diabetes is at goal, last a1c 6.9%. Will check in February to March time period. Patient is on metformin and ADA diet.  She is on lisinopril for renal protection She takes asa 77m       Relevant Orders   Hemoglobin A1c   CMP14+EGFR   Lipid panel     Endocrine   Hypothyroid   Relevant Orders   TSH     Other   Iatrogenic hypotension - Primary    If her bp remains low on the lisinopril 2.557mwill discontinue the medication since her blood pressure is likely able to be maintained on its own  Former smoker    Other Visit Diagnoses    Medication monitoring encounter           No orders of the defined types were placed in this encounter.   Follow-up: Return in about 6 months (around 12/19/2018).    Forrest Moron, MD

## 2018-06-19 NOTE — Patient Instructions (Addendum)
If you have lab work done today you will be contacted with your lab results within the next 2 weeks.  If you have not heard from [place] then please contact us. The fastest way to get your results is to register for My Chart.   IF you received an x-ray today, you will receive an invoice from [facility]. Please contact [facility] at [phone] with questions or concerns regarding your invoice.   IF you received labwork today, you will receive an invoice from [place]. Please contact [facility] at [phone] with questions or concerns regarding your invoice.   Our billing staff will not be able to assist you with questions regarding bills from these companies.  You will be contacted with the lab results as soon as they are available. The fastest way to get your results is to activate your My Chart account. Instructions are located on the last page of this paperwork. If you have not heard from [place] regarding the results in 2 weeks, please contact this office.      Hypotension Hypotension, commonly called low blood pressure, is when the force of blood pumping through your arteries is too weak. Arteries are blood vessels that carry blood from the heart throughout the body. When blood pressure is too low, you may not get enough blood to your brain or to the rest of your organs. This can cause weakness, light-headedness, rapid heartbeat, and fainting. Depending on the cause and severity, hypotension may be harmless (benign) or cause serious problems (critical). What are the causes? Possible causes of hypotension include:  Blood loss.  Loss of body fluids (dehydration).  Heart problems.  Hormone (endocrine) problems.  Pregnancy.  Severe infection.  Lack of certain nutrients.  Severe allergic reactions (anaphylaxis).  Certain medicines, such as blood pressure medicine or medicines that make the body lose excess fluids (diuretics). Sometimes, hypotension can be caused  by not taking medicine as directed, such as taking too much of a certain medicine.  What increases the risk? Certain factors can make you more likely to develop hypotension, including:  Age. Risk increases as you get older.  Conditions that affect the heart or the central nervous system.  Taking certain medicines, such as blood pressure medicine or diuretics.  Being pregnant.  What are the signs or symptoms? Symptoms of this condition may include:  Weakness.  Light-headedness.  Dizziness.  Blurred vision.  Fatigue.  Rapid heartbeat.  Fainting, in severe cases.  How is this diagnosed? This condition is diagnosed based on:  Your medical history.  Your symptoms.  Your blood pressure measurement. Your health care provider will check your blood pressure when you are: ? Lying down. ? Sitting. ? Standing.  A blood pressure reading is recorded as two numbers, such as "120 over 80" (or 120/80). The first ("top") number is called the systolic pressure. It is a measure of the pressure in your arteries as your heart beats. The second ("bottom") number is called the diastolic pressure. It is a measure of the pressure in your arteries when your heart relaxes between beats. Blood pressure is measured in a unit called mm Hg. Healthy blood pressure for adults is 120/80. If your blood pressure is below 90/60, you may be diagnosed with hypotension. Other information or tests that may be used to diagnose hypotension include:  Your other vital signs, such as your heart rate and temperature.  Blood tests.  Tilt table test. For this test, you will be safely secured to  a table that moves you from a lying position to an upright position. Your heart rhythm and blood pressure will be monitored during the test.  How is this treated? Treatment for this condition may include:  Changing your diet. This may involve eating more salt (sodium) or drinking more water.  Taking medicines to raise  your blood pressure.  Changing the dosage of certain medicines you are taking that might be lowering your blood pressure.  Wearing compression stockings. These stockings help to prevent blood clots and reduce swelling in your legs.  In some cases, you may need to go to the hospital for:  Fluid replacement. This means you will receive fluids through an IV tube.  Blood replacement. This means you will receive donated blood through an IV tube (transfusion).  Treating an infection or heart problems, if this applies.  Monitoring. You may need to be monitored while medicines that you are taking wear off.  Follow these instructions at home: Eating and drinking   Drink enough fluid to keep your urine clear or pale yellow.  Eat a healthy diet and follow instructions from your health care provider about eating or drinking restrictions. A healthy diet includes: ? Fresh fruits and vegetables. ? Whole grains. ? Lean meats. ? Low-fat dairy products.  Eat extra salt only as directed. Do not add extra salt to your diet unless your health care provider told you to do that.  Eat frequent, small meals.  Avoid standing up suddenly after eating. Medicines  Take over-the-counter and prescription medicines only as told by your health care provider. ? Follow instructions from your health care provider about changing the dosage of your current medicines, if this applies. ? Do not stop or adjust any of your medicines on your own. General instructions  Wear compression stockings as told by your health care provider.  Get up slowly from lying down or sitting positions. This gives your blood pressure a chance to adjust.  Avoid hot showers and excessive heat as directed by your health care provider.  Return to your normal activities as told by your health care provider. Ask your health care provider what activities are safe for you.  Do not use any products that contain nicotine or tobacco, such as  cigarettes and e-cigarettes. If you need help quitting, ask your health care provider.  Keep all follow-up visits as told by your health care provider. This is important. Contact a health care provider if:  You vomit.  You have diarrhea.  You have a fever for more than 2-3 days.  You feel more thirsty than usual.  You feel weak and tired. Get help right away if:  You have chest pain.  You have a fast or irregular heartbeat.  You develop numbness in any part of your body.  You cannot move your arms or your legs.  You have trouble speaking.  You become sweaty or feel light-headed.  You faint.  You feel short of breath.  You have trouble staying awake.  You feel confused. This information is not intended to replace advice given to you by your health care provider. Make sure you discuss any questions you have with your health care provider. Document Released: 06/30/2005 Document Revised: 01/18/2016 Document Reviewed: 12/20/2015 Elsevier Interactive Patient Education  2018 [employer]

## 2018-06-19 NOTE — Assessment & Plan Note (Signed)
If her bp remains low on the lisinopril 2.5mg  will discontinue the medication since her blood pressure is likely able to be maintained on its own

## 2018-06-19 NOTE — Assessment & Plan Note (Signed)
Diabetes is at goal, last a1c 6.9%. Will check in February to March time period. Patient is on metformin and ADA diet.  She is on lisinopril for renal protection She takes asa 81mg 

## 2018-08-16 ENCOUNTER — Ambulatory Visit: Payer: BLUE CROSS/BLUE SHIELD | Admitting: Internal Medicine

## 2018-08-16 ENCOUNTER — Encounter: Payer: Self-pay | Admitting: Internal Medicine

## 2018-08-16 VITALS — BP 98/60 | HR 62 | Ht 63.5 in | Wt 108.0 lb

## 2018-08-16 DIAGNOSIS — E1140 Type 2 diabetes mellitus with diabetic neuropathy, unspecified: Secondary | ICD-10-CM | POA: Diagnosis not present

## 2018-08-16 DIAGNOSIS — E1142 Type 2 diabetes mellitus with diabetic polyneuropathy: Secondary | ICD-10-CM | POA: Diagnosis not present

## 2018-08-16 DIAGNOSIS — E785 Hyperlipidemia, unspecified: Secondary | ICD-10-CM | POA: Diagnosis not present

## 2018-08-16 LAB — POCT GLYCOSYLATED HEMOGLOBIN (HGB A1C): Hemoglobin A1C: 6.7 % — AB (ref 4.0–5.6)

## 2018-08-16 NOTE — Progress Notes (Signed)
Patient ID: Carmen Anderson, female   DOB: 08-06-1960, 58 y.o.   MRN: [id number]  HPI: Carmen Anderson is a 58 y.o.-year-old female, initially referred by her PCP, Olean Ree, NP, for management of DM2, dx in 2001-2002, non-insulin-dependent, now more controlled, with complications (PN). Last visit 4 months ago.  Father passed away 2018-05-19. Sugars higher then.  However, they are now close to her baseline.  Last hemoglobin A1c was: Lab Results  Component Value Date   HGBA1C 6.9 (A) 04/13/2018   HGBA1C 6.6 (A) 12/14/2017   HGBA1C 6.9 (H) 09/06/2017   HGBA1C 7 08/14/2017   HGBA1C 6.8 04/13/2017   HGBA1C 6.7 11/25/2016   HGBA1C 7.0 06/03/2016   HGBA1C 7.0 03/04/2016   HGBA1C 7.0 12/03/2015   HGBA1C 7.9 (H) 09/05/2015   HGBA1C 7.7 (H) 05/09/2015   HGBA1C 7.0 10/27/2014   HGBA1C 6.7 04/28/2014   HGBA1C 7.0 12/14/2013   HGBA1C 6.8 08/03/2013   HGBA1C 6.7 01/26/2013   HGBA1C 6.6 10/20/2012   HGBA1C 6.5 04/21/2012   HGBA1C 6.8 01/21/2012   HGBA1C 6.7 10/20/2011    Pt is on a regimen of: - Metformin ER 500 mg 2x a day, with meals (decreased from 3x a day Spring 2016) -tolerates this well - Januvia 100 mg daily in am >> Jardiance 10 mg before breakfast  Pt checks her sugars twice a day per her excellent log: - am:  96-139 >> 94-121 >> 77, 80-128, 132 - 2h after b'fast:  89-154, 167 >> 111-159, 168 >> 139-175 - before lunch: 84-129 >> 89-122 >> 86-119 - 2h after lunch: 101-129, 158 >> 96-160 >> 122-173 - before dinner: 96-122 >> 92-120 >> 89-122 - 2h after dinner: 107-163, 172 >> 119-164 >> 147-172 - bedtime:   98-124 >> 96-126 >> 89-128 - nighttime: n/c >> n/c Lowest sugar was 38 ... >> 89 >> 77; she has hypoglycemia awareness in the 50s. Highestin the 50s 172 >> 168 >> 240.  Glucometer: FreeStyle >> [facility]  Pt's meals are: - snack:  1/2 yoghurt, PB + bread - Breakfast: wheat waffle + sugar free syrup or wheat muffin w/ 1 egg  - Lunch: salads or [place] sandwich on wheat  bread - Dinner: soups, salad, meat 2x a weekly (chicken, salmon) + veggies - Snacks: pineapple, celery  -No history of CKD, last BUN/creatinine:  Lab Results  Component Value Date   BUN 20 12/14/2017   CREATININE 1.07 (H) 12/14/2017  On lisinopril 2.5. -+ HL; last set of lipids: Lab Results  Component Value Date   CHOL 110 10/20/2017   HDL 43 10/20/2017   LDLCALC 52 10/20/2017   TRIG 74 10/20/2017   CHOLHDL 2.6 10/20/2017  On Lipitor, fish oil. - last eye exam was in 07/2017: No DR; [facility].  - she has Numbness and tingling in her feet.  Continues on Neurontin and B complex  She had a NSTEMI in 08/2017.  At that time, she had a catheterization and had 90% blockage on LAD.  She had a stent placed.  She also has hypertension.  Also has controlled hypothyroidism.  Last TSH normal: Lab Results  Component Value Date   TSH 1.473 09/05/2017   ROS: Constitutional: no weight gain/no weight loss, no fatigue, no subjective hyperthermia, no subjective hypothermia Eyes: no blurry vision, no xerophthalmia ENT: no sore throat, no nodules palpated in neck, no dysphagia, no odynophagia, no hoarseness Cardiovascular: no CP/no SOB/no palpitations/no leg swelling Respiratory: no cough/no SOB/no wheezing Gastrointestinal: no N/no V/no  D/no C/no acid reflux Musculoskeletal: no muscle aches/no joint aches Skin: no rashes, no hair loss Neurological: no tremors/+ numbness/+ tingling/no dizziness  I reviewed pt's medications, allergies, PMH, social hx, family hx, and changes were documented in the history of present illness. Otherwise, unchanged from my initial visit note.  Past Medical History:  Diagnosis Date  . DM type 2 (diabetes mellitus, type 2) (HCC)   . History of hiatal hernia   . Hyperlipidemia   . Hypertension   . Hypothyroidism    Past Surgical History:  Procedure Laterality Date  . ABDOMINAL HYSTERECTOMY  1995  . CARDIAC CATHETERIZATION    . CORONARY STENT INTERVENTION N/A  09/07/2017   Procedure: CORONARY STENT INTERVENTION;  Surgeon: Runell Gess, MD;  Location: [facility];  Service: Cardiovascular;  Laterality: N/A;  . LEFT HEART CATH AND CORONARY ANGIOGRAPHY N/A 09/07/2017   Procedure: LEFT HEART CATH AND CORONARY ANGIOGRAPHY;  Surgeon: Runell Gess, MD;  Location: [facility];  Service: Cardiovascular;  Laterality: N/A;  . STAPEDECTOMY Right ~ 2002   Social History   Social History  . Marital Status: Married    Spouse Name: N/A  . Number of Children: 0  . Years of Education: 12   Occupational History  . property asst. Production designer, theatre/television/film    Social History Main Topics  . Smoking status: Current Every Day Smoker -- 0.50 packs/day for 10 years    Types: Cigarettes  . Smokeless tobacco: Not on file     Comment: pt states thinking of quitting  . Alcohol Use: No  . Drug Use: No   Social History Narrative   Exercise: walking, 4 times/week for 30 minutes.   Current Outpatient Medications on File Prior to Visit  Medication Sig Dispense Refill  . aspirin EC 81 MG EC tablet Take 1 tablet (81 mg total) by mouth daily.    [name] atorvastatin (LIPITOR) 80 MG tablet Take 1 tablet (80 mg total) by mouth 2 (two) times daily. 90 tablet 3  . empagliflozin (JARDIANCE) 10 MG TABS tablet Take 10 mg by mouth daily. 90 mg 3  . fluticasone (FLONASE) 50 MCG/ACT nasal spray Place 2 sprays into both nostrils daily. 16 g 6  . gabapentin (NEURONTIN) 100 MG capsule TAKE 1 CAPSULE (100 MG TOTAL) BY MOUTH AT BEDTIME. 90 capsule 3  . levothyroxine (SYNTHROID, LEVOTHROID) 50 MCG tablet TAKE 1 TABLET BY MOUTH EVERY DAY 90 tablet 3  . lisinopril (PRINIVIL,ZESTRIL) 2.5 MG tablet Take 1 tablet (2.5 mg total) by mouth daily. 90 tablet 3  . metFORMIN (GLUCOPHAGE-XR) 500 MG 24 hr tablet TAKE 1 TABLET BY MOUTH TWICE DAILY WITH MEALS AS DIRECTED 180 tablet 3  . nitroGLYCERIN (NITROSTAT) 0.4 MG SL tablet Place 1 tablet (0.4 mg total) under the tongue every 5 (five) minutes x 3  doses as needed for chest pain. 25 tablet 3  . ticagrelor (BRILINTA) 90 MG TABS tablet Take 1 tablet (90 mg total) by mouth 2 (two) times daily. 180 tablet 3   No current facility-administered medications on file prior to visit.    Allergies  Allergen Reactions  . Aspirin Other (See Comments)    Burns stomach  . Codeine Rash   Family History  Problem Relation Age of Onset  . Diabetes Mother   . Heart disease Mother   . Stroke Father        2011  . Hyperlipidemia Father   . Diabetes Sister   . Heart disease Sister   .  Heart disease Brother        congenital   PE: BP 98/60   Pulse 62   Ht 5' 3.5" (1.613 m)   Wt 108 lb (49 kg)   SpO2 96%   BMI 18.83 kg/m  Body mass index is 18.83 kg/m. Wt Readings from Last 3 Encounters:  08/16/18 108 lb (49 kg)  06/19/18 109 lb 9.6 oz (49.7 kg)  06/15/18 108 lb 9.6 oz (49.3 kg)   Constitutional: normal weight, in NAD Eyes: PERRLA, EOMI, no exophthalmos ENT: moist mucous membranes, no thyromegaly, no cervical lymphadenopathy Cardiovascular: RRR, No MRG Respiratory: CTA B Gastrointestinal: abdomen soft, NT, ND, BS+ Musculoskeletal: no deformities, strength intact in all 4 Skin: moist, warm, no rashes Neurological: no tremor with outstretched hands, DTR normal in all 4  ASSESSMENT: 1. DM2, non-insulin-dependent, now more controlled, with complications - PN   2. PN   3. HL  PLAN:  1. Patient with longstanding, controlled, type 2 diabetes.  She had an NSTEMI in 08/2017 and had a stent placed.  We started Jardiance after this.  No side effects from the medication.  She does stay well-hydrated.  She also continues to exercise even after finishing cardiac rehab.  She is walking outside or on the treadmill/stable. -At last visit, sugars were almost entirely at goal, with few exceptions, per review of her excellent log.  We did not change the regimen then. -At this visit, they more a little higher, due to the holidays and also due to her  father passing away.  However, she continues to play attention to her diet and also continues to exercise.  Since sugars are at or very close to goal, no changes are needed in her medication regimen today. - I suggested to:  Patient Instructions  Please continue: - Metformin ER 500 mg 2x a day with meals - Jardiance 10 mg before breakfast  Please return in 4 months with your sugar log.   - today, HbA1c is 6.7% (improved) - continue checking sugars at different times of the day - check 1x a day, rotating checks - advised for yearly eye exams >> she is not UTD - Return to clinic in 4 mo with sugar log      2. Diabetic PN -No new complaints, stable -She continues on: -B complex 1 tablet daily -Neurontin 100 mg daily  3. HL - Reviewed latest lipid panel from 10/2017: Excellent Lab Results  Component Value Date   CHOL 110 10/20/2017   HDL 43 10/20/2017   LDLCALC 52 10/20/2017   TRIG 74 10/20/2017   CHOLHDL 2.6 10/20/2017  - Continues statin and fish oil without side effects.  Carlus Pavlovristina Saarah Dewing, MD PhD [facility]

## 2018-08-16 NOTE — Patient Instructions (Signed)
Please continue: - Metformin ER 500 mg 2x a day with meals - Jardiance 10 mg before breakfast  Please return in 4 months with your sugar log. 

## 2018-08-16 NOTE — Addendum Note (Signed)
Addended by: Darliss Ridgel I on: 08/16/2018 05:10 PM   Modules accepted: Orders

## 2018-09-07 ENCOUNTER — Other Ambulatory Visit: Payer: Self-pay | Admitting: Cardiovascular Disease

## 2018-09-07 MED ORDER — LISINOPRIL 2.5 MG PO TABS: 2.5000 mg | ORAL_TABLET | Freq: Every day | ORAL | 3 refills | Status: DC

## 2018-09-07 NOTE — Telephone Encounter (Signed)
Rx(s) sent to pharmacy electronically.  

## 2018-09-07 NOTE — Telephone Encounter (Signed)
°*  STAT* If patient is at the pharmacy, call can be transferred to refill team.   1. Which medications need to be refilled? (please list name of each medication and dose if known) lisinopril 2.5 mg   2. Which pharmacy/location (including street and city if local pharmacy) is medication to be sent to? [facility] [address]  3. Do they need a 30 day or 90 day supply? 90

## 2018-09-21 ENCOUNTER — Other Ambulatory Visit: Payer: Self-pay

## 2018-09-21 ENCOUNTER — Encounter: Payer: Self-pay | Admitting: Family Medicine

## 2018-09-21 ENCOUNTER — Ambulatory Visit: Payer: BLUE CROSS/BLUE SHIELD | Admitting: Family Medicine

## 2018-09-21 VITALS — BP 113/65 | HR 56 | Temp 98.3°F | Ht 63.5 in | Wt 108.2 lb

## 2018-09-21 DIAGNOSIS — E1159 Type 2 diabetes mellitus with other circulatory complications: Secondary | ICD-10-CM

## 2018-09-21 DIAGNOSIS — R6889 Other general symptoms and signs: Secondary | ICD-10-CM | POA: Diagnosis not present

## 2018-09-21 DIAGNOSIS — E782 Mixed hyperlipidemia: Secondary | ICD-10-CM | POA: Diagnosis not present

## 2018-09-21 DIAGNOSIS — Z1239 Encounter for other screening for malignant neoplasm of breast: Secondary | ICD-10-CM

## 2018-09-21 DIAGNOSIS — E034 Atrophy of thyroid (acquired): Secondary | ICD-10-CM

## 2018-09-21 NOTE — Progress Notes (Signed)
Established Patient Office Visit  Subjective:  Patient ID: Carmen Anderson, female    DOB: Jun 04, 1961  Age: 58 y.o. MRN: [id number]  CC:  Chief Complaint  Patient presents with  . chronic condtion    4 m f/u     HPI Carmen Anderson presents for   Diabetes Mellitus: Patient presents for follow up of diabetes. Symptoms: none. Symptoms have stabilized. Patient denies foot ulcerations, hyperglycemia, increase appetite, nausea, polydipsia and visual disturbances.  Evaluation to date has been included: hemoglobin A1C.  Home sugars: BGs consistently in an acceptable range. Treatment to date: no recent interventions.  Lab Results  Component Value Date   HGBA1C 6.7 (A) 08/16/2018   Wt Readings from Last 3 Encounters:  09/21/18 108 lb 3.2 oz (49.1 kg)  08/16/18 108 lb (49 kg)  06/19/18 109 lb 9.6 oz (49.7 kg)   Body mass index is 18.87 kg/m.  Dyslipidemia: Patient presents for evaluation of lipids.  Compliance with treatment thus far has been excellent.  A repeat fasting lipid profile was done.  The patient does not use medications that may worsen dyslipidemias (corticosteroids, progestins, anabolic steroids, diuretics, beta-blockers, amiodarone, cyclosporine, olanzapine). The patient exercises daily.  The patient is known to have coexisting coronary artery disease.    Hypertension: Patient here for follow-up of elevated blood pressure. She is exercising and is adherent to low salt diet.  Blood pressure is well controlled at home. Cardiac symptoms none. Patient denies chest pain, chest pressure/discomfort, claudication, exertional chest pressure/discomfort, fatigue, irregular heart beat and near-syncope.  Cardiovascular risk factors: diabetes mellitus, dyslipidemia and hypertension. Use of agents associated with hypertension: none. History of target organ damage: angina/ prior myocardial infarction. BP Readings from Last 3 Encounters:  09/21/18 113/65  08/16/18 98/60  06/19/18 98/61      Past Medical History:  Diagnosis Date  . DM type 2 (diabetes mellitus, type 2) ([place])   . History of hiatal hernia   . Hyperlipidemia   . Hypertension   . Hypothyroidism     Past Surgical History:  Procedure Laterality Date  . ABDOMINAL HYSTERECTOMY  1995  . CARDIAC CATHETERIZATION    . CORONARY STENT INTERVENTION N/A 09/07/2017   Procedure: CORONARY STENT INTERVENTION;  Surgeon: Lorretta Harp, MD;  Location: [facility];  Service: Cardiovascular;  Laterality: N/A;  . LEFT HEART CATH AND CORONARY ANGIOGRAPHY N/A 09/07/2017   Procedure: LEFT HEART CATH AND CORONARY ANGIOGRAPHY;  Surgeon: Lorretta Harp, MD;  Location: [facility];  Service: Cardiovascular;  Laterality: N/A;  . STAPEDECTOMY Right ~ 2002    Family History  Problem Relation Age of Onset  . Diabetes Mother   . Heart disease Mother   . Stroke Father        2011  . Hyperlipidemia Father   . Diabetes Sister   . Heart disease Sister   . Heart disease Brother        congenital    Social History   Socioeconomic History  . Marital status: Married    Spouse name: Not on file  . Number of children: Not on file  . Years of education: 47  . Highest education level: Not on file  Occupational History  . 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 Financial resource strain: Not on file  . Food insecurity:    Worry: Not on file    Inability: Not on file  . Transportation needs:  Medical: Not on file    Non-medical: Not on file  Tobacco Use  . Smoking status: Former Smoker    Packs/day: 0.50    Years: 27.00    Pack years: 13.50    Types: Cigarettes    Last attempt to quit: 09/05/2017    Years since quitting: 1.0  . Smokeless tobacco: Never Used  Substance and Sexual Activity  . Alcohol use: No  . Drug use: No  . Sexual activity: Yes    Birth control/protection: None  Lifestyle  . Physical activity:    Days per week: Not on file     Minutes per session: Not on file  . Stress: Not on file  Relationships  . Social connections:    Talks on phone: Not on file    Gets together: Not on file    Attends religious service: Not on file    Active member of club or organization: Not on file    Attends meetings of clubs or organizations: Not on file    Relationship status: Not on file  . Intimate partner violence:    Fear of current or ex partner: Not on file    Emotionally abused: Not on file    Physically abused: Not on file    Forced sexual activity: Not on file  Other Topics Concern  . Not on file  Social History Narrative   Exercise: walking, 4 times/week for 30 minutes.    Outpatient Medications Prior to Visit  Medication Sig Dispense Refill  . aspirin EC 81 MG EC tablet Take 1 tablet (81 mg total) by mouth daily.    [name] atorvastatin (LIPITOR) 80 MG tablet Take 1 tablet (80 mg total) by mouth 2 (two) times daily. 90 tablet 3  . empagliflozin (JARDIANCE) 10 MG TABS tablet Take 10 mg by mouth daily. 90 mg 3  . fluticasone (FLONASE) 50 MCG/ACT nasal spray Place 2 sprays into both nostrils daily. 16 g 6  . gabapentin (NEURONTIN) 100 MG capsule TAKE 1 CAPSULE (100 MG TOTAL) BY MOUTH AT BEDTIME. 90 capsule 3  . levothyroxine (SYNTHROID, LEVOTHROID) 50 MCG tablet TAKE 1 TABLET BY MOUTH EVERY DAY 90 tablet 3  . lisinopril (PRINIVIL,ZESTRIL) 2.5 MG tablet Take 1 tablet (2.5 mg total) by mouth daily. 90 tablet 3  . metFORMIN (GLUCOPHAGE-XR) 500 MG 24 hr tablet TAKE 1 TABLET BY MOUTH TWICE DAILY WITH MEALS AS DIRECTED 180 tablet 3  . nitroGLYCERIN (NITROSTAT) 0.4 MG SL tablet Place 1 tablet (0.4 mg total) under the tongue every 5 (five) minutes x 3 doses as needed for chest pain. 25 tablet 3  . ticagrelor (BRILINTA) 90 MG TABS tablet Take 1 tablet (90 mg total) by mouth 2 (two) times daily. 180 tablet 3   No facility-administered medications prior to visit.     Allergies  Allergen Reactions  . Aspirin Other (See Comments)     Burns stomach  . Codeine Rash    ROS Review of Systems Review of Systems  Constitutional: Negative for activity change, appetite change, chills and fever.  HENT: Negative for congestion, nosebleeds, trouble swallowing and voice change.   Respiratory: Negative for cough, shortness of breath and wheezing.   Gastrointestinal: Negative for diarrhea, nausea and vomiting.  Genitourinary: Negative for difficulty urinating, dysuria, flank pain and hematuria.  Musculoskeletal: Negative for back pain, joint swelling and neck pain.  Neurological: Negative for dizziness, speech difficulty, light-headedness and numbness.  See HPI. All other review of systems negative.  Objective:    Physical Exam  BP 113/65 (BP Location: Right Arm, Patient Position: Sitting, Cuff Size: Normal)   Pulse (!) 56   Temp 98.3 F (36.8 C) (Oral)   Ht 5' 3.5" (1.613 m)   Wt 108 lb 3.2 oz (49.1 kg)   SpO2 99%   BMI 18.87 kg/m  Wt Readings from Last 3 Encounters:  09/21/18 108 lb 3.2 oz (49.1 kg)  08/16/18 108 lb (49 kg)  06/19/18 109 lb 9.6 oz (49.7 kg)   Physical Exam  Constitutional: Oriented to person, place, and time. Appears well-developed and well-nourished.  HENT:  Head: Normocephalic and atraumatic.  Eyes: Conjunctivae and EOM are normal.  Cardiovascular: Normal rate, regular rhythm, normal heart sounds and intact distal pulses.  No murmur heard. Pulmonary/Chest: Effort normal and breath sounds normal. No stridor. No respiratory distress. Has no wheezes.  Neurological: Is alert and oriented to person, place, and time.  Skin: Skin is warm. Capillary refill takes less than 2 seconds.  Psychiatric: Has a normal mood and affect. Behavior is normal. Judgment and thought content normal.    Health Maintenance Due  Topic Date Due  . OPHTHALMOLOGY EXAM  07/17/2018  . MAMMOGRAM  07/29/2018    There are no preventive care reminders to display for this patient.  Lab Results  Component Value Date    TSH 1.770 09/21/2018   Lab Results  Component Value Date   WBC 12.3 (H) 09/21/2018   HGB 13.7 09/21/2018   HCT 41.1 09/21/2018   MCV 93 09/21/2018   PLT 315 09/21/2018   Lab Results  Component Value Date   NA 138 09/21/2018   K 4.6 09/21/2018   CO2 16 (L) 09/21/2018   GLUCOSE 130 (H) 09/21/2018   BUN 13 09/21/2018   CREATININE 0.85 09/21/2018   BILITOT 0.4 09/21/2018   ALKPHOS 63 09/21/2018   AST 20 09/21/2018   ALT 25 09/21/2018   PROT 6.7 09/21/2018   ALBUMIN 4.9 09/21/2018   CALCIUM 9.7 09/21/2018   ANIONGAP 7 09/08/2017   Lab Results  Component Value Date   CHOL 129 09/21/2018   Lab Results  Component Value Date   HDL 56 09/21/2018   Lab Results  Component Value Date   LDLCALC 60 09/21/2018   Lab Results  Component Value Date   TRIG 65 09/21/2018   Lab Results  Component Value Date   CHOLHDL 2.3 09/21/2018   Lab Results  Component Value Date   HGBA1C 6.7 (A) 08/16/2018      Assessment & Plan:   Problem List Items Addressed This Visit      Cardiovascular and Mediastinum   Diabetes mellitus with cardiac complication (HCC)- well controlled hemoglobin a1c is at goal Continue exercise Lipids monitored and renal function in range On metformin On ace or arb On asa 81mg Reviewed diabetic foot care Emphasized importance of eye and dental exam      Relevant Orders   CBC (Completed)     Endocrine   Hypothyroid-  Last levels were good Will check    Relevant Orders   TSH (Completed)     Other   Hyperlipidemia - Primary Discussed medications that affect lipids Reminded patient to avoid grapefruits Reviewed last 3 lipids Discussed current meds: statin, aspirin Advised dietary fiber and fish oil and ways to keep HDL high Patient is a former smoker      Relevant Orders   TSH (Completed)   CMP14+EGFR (Completed)   Lipid panel (Completed)      Other Visit Diagnoses    Cold intolerance    -  Will check thyroid Weight loss has slowed    Relevant Orders   TSH (Completed)   Screening for breast cancer       Relevant Orders   MM Digital Screening      No orders of the defined types were placed in this encounter.   Follow-up: Return in about 3 months (around 12/22/2018) for medication monitoring.    Forrest Moron, MD

## 2018-09-21 NOTE — Patient Instructions (Addendum)
  Take Metformin once a day  If your fasting glucose goes above 140 then go back to twice a day and let Dr. Elvera Lennox know   Continue Jardiance at the same dose Continue the other medications  You make have to decrease your lisinopril to every other day if your blood pressure gets any lower (<100/60)     If you have lab work done today you will be contacted with your lab results within the next 2 weeks.  If you have not heard from [place] then please contact us. The fastest way to get your results is to register for My Chart.   IF you received an x-ray today, you will receive an invoice from [facility]. Please contact [facility] at [phone] with questions or concerns regarding your invoice.   IF you received labwork today, you will receive an invoice from [facility]. Please contact [facility] at [phone] with questions or concerns regarding your invoice.   Our billing staff will not be able to assist you with questions regarding bills from these companies.  You will be contacted with the lab results as soon as they are available. The fastest way to get your results is to activate your My Chart account. Instructions are located on the last page of this paperwork. If you have not heard from [place] regarding the results in 2 weeks, please contact this office.

## 2018-09-22 LAB — CMP14+EGFR
ALT: 25 IU/L (ref 0–32)
AST: 20 IU/L (ref 0–40)
Albumin/Globulin Ratio: 2.7 — ABNORMAL HIGH (ref 1.2–2.2)
Albumin: 4.9 g/dL (ref 3.8–4.9)
Alkaline Phosphatase: 63 IU/L (ref 39–117)
BUN/Creatinine Ratio: 15 (ref 9–23)
BUN: 13 mg/dL (ref 6–24)
Bilirubin Total: 0.4 mg/dL (ref 0.0–1.2)
CO2: 16 mmol/L — ABNORMAL LOW (ref 20–29)
Calcium: 9.7 mg/dL (ref 8.7–10.2)
Chloride: 101 mmol/L (ref 96–106)
Creatinine, Ser: 0.85 mg/dL (ref 0.57–1.00)
GFR calc Af Amer: 88 mL/min/{1.73_m2} (ref >59)
GFR calc non Af Amer: 76 mL/min/{1.73_m2} (ref >59)
Globulin, Total: 1.8 g/dL (ref 1.5–4.5)
Glucose: 130 mg/dL — ABNORMAL HIGH (ref 65–99)
Potassium: 4.6 mmol/L (ref 3.5–5.2)
Sodium: 138 mmol/L (ref 134–144)
Total Protein: 6.7 g/dL (ref 6.0–8.5)

## 2018-09-22 LAB — CBC
Hematocrit: 41.1 % (ref 34.0–46.6)
Hemoglobin: 13.7 g/dL (ref 11.1–15.9)
MCH: 31.1 pg (ref 26.6–33.0)
MCHC: 33.3 g/dL (ref 31.5–35.7)
MCV: 93 fL (ref 79–97)
Platelets: 315 10*3/uL (ref 150–450)
RBC: 4.41 x10E6/uL (ref 3.77–5.28)
RDW: 12.3 % (ref 11.7–15.4)
WBC: 12.3 10*3/uL — ABNORMAL HIGH (ref 3.4–10.8)

## 2018-09-22 LAB — LIPID PANEL
Chol/HDL Ratio: 2.3 ratio (ref 0.0–4.4)
Cholesterol, Total: 129 mg/dL (ref 100–199)
HDL: 56 mg/dL (ref >39)
LDL Calculated: 60 mg/dL (ref 0–99)
Triglycerides: 65 mg/dL (ref 0–149)
VLDL Cholesterol Cal: 13 mg/dL (ref 5–40)

## 2018-09-22 LAB — TSH: TSH: 1.77 u[IU]/mL (ref 0.450–4.500)

## 2018-09-28 ENCOUNTER — Other Ambulatory Visit: Payer: Self-pay | Admitting: Cardiovascular Disease

## 2018-09-28 MED ORDER — TICAGRELOR 90 MG PO TABS: 90.0000 mg | ORAL_TABLET | Freq: Two times a day (BID) | ORAL | 0 refills | Status: DC

## 2018-09-28 NOTE — Telephone Encounter (Signed)
New message    *STAT* If patient is at the pharmacy, call can be transferred to refill team.   1. Which medications need to be refilled? (please list name of each medication and dose if known) ticagrelor (BRILINTA) 90 MG TABS tablet  2. Which pharmacy/location (including street and city if local pharmacy) is medication to be sent to?[id number] - [place], [place] - [address]  3. Do they need a 30 day or 90 day supply?90

## 2018-09-30 ENCOUNTER — Other Ambulatory Visit: Payer: Self-pay | Admitting: *Deleted

## 2018-12-13 ENCOUNTER — Other Ambulatory Visit: Payer: Self-pay

## 2018-12-13 ENCOUNTER — Telehealth: Payer: Self-pay | Admitting: Physician Assistant

## 2018-12-13 NOTE — Telephone Encounter (Signed)
Telephone visit/my chart/pre reg complete/consent obtained -- ttf °

## 2018-12-15 ENCOUNTER — Ambulatory Visit: Payer: BC Managed Care – PPO | Admitting: Internal Medicine

## 2018-12-15 ENCOUNTER — Encounter: Payer: Self-pay | Admitting: Internal Medicine

## 2018-12-15 ENCOUNTER — Other Ambulatory Visit: Payer: Self-pay

## 2018-12-15 VITALS — BP 100/60 | HR 51 | Ht 63.5 in | Wt 109.0 lb

## 2018-12-15 DIAGNOSIS — E1140 Type 2 diabetes mellitus with diabetic neuropathy, unspecified: Secondary | ICD-10-CM | POA: Diagnosis not present

## 2018-12-15 DIAGNOSIS — E1142 Type 2 diabetes mellitus with diabetic polyneuropathy: Secondary | ICD-10-CM | POA: Diagnosis not present

## 2018-12-15 DIAGNOSIS — E785 Hyperlipidemia, unspecified: Secondary | ICD-10-CM | POA: Diagnosis not present

## 2018-12-15 LAB — POCT GLYCOSYLATED HEMOGLOBIN (HGB A1C): Hemoglobin A1C: 7.6 % — AB (ref 4.0–5.6)

## 2018-12-15 MED ORDER — EMPAGLIFLOZIN 10 MG PO TABS: 10.0000 mg | ORAL_TABLET | Freq: Every day | ORAL | 3 refills | Status: DC

## 2018-12-15 MED ORDER — METFORMIN HCL ER 500 MG PO TB24: ORAL_TABLET | ORAL | 3 refills | Status: DC

## 2018-12-15 NOTE — Patient Instructions (Addendum)
Please increase: - Metformin ER 500 mg 2x a day with meals  Continue: - Jardiance 10 mg before breakfast  Please return in 4 months with your sugar log.

## 2018-12-15 NOTE — Progress Notes (Signed)
Virtual Visit via Telephone Note   This visit type was conducted due to national recommendations for restrictions regarding the COVID-19 Pandemic (e.g. social distancing) in an effort to limit this patient's exposure and mitigate transmission in our community.  Due to her co-morbid illnesses, this patient is at least at moderate risk for complications without adequate follow up.  This format is felt to be most appropriate for this patient at this time.  The patient did not have access to video technology/had technical difficulties with video requiring transitioning to audio format only (telephone).  All issues noted in this document were discussed and addressed.  No physical exam could be performed with this format.  Please refer to the patient's chart for her  consent to telehealth for [facility].   Date:  12/16/2018   ID:  Carmen Anderson, DOB 11/16/1960, MRN [id number]  Patient Location: Home Provider Location: Other:  [facility]  PCP:  Doristine Bosworth, MD  Cardiologist:  Nicki Guadalajara, MD  Electrophysiologist:  None   Evaluation Performed:  Follow-Up Visit  Chief Complaint:  CAD, NSTEMI, iatrogenic hypotension  History of Present Illness:    Carmen Anderson is a 58 y.o. female with past medical history of CAD, NSTEMI (08/2017), DM type II, hypothyroidism, hypertension, history of tobacco use, and hyperlipidemia.  Unfortunately she suffered a non-ST segment MI in February 2019. She underwent heart cath following an abnormal stress test.  Heart catheterization revealed 90% stenosis in the proximal to mid LAD treated with a DES.  LDL improved from 191 to 52 with initiation of 80 mg atorvastatin.  Echocardiogram at that time revealed normal EF.    She was last seen in clinic with Dr. Tresa Endo on 06/15/2018.  She had mild orthostatic hypotension during that visit.  Her lisinopril was reduced to 2.5 mg daily. She was still on dual antiplatelet therapy at that time with aspirin and 90 mg  Brilinta twice daily.  She presents today for follow-up. She recently retired from a stressful job. She saw her PCP today 2.5 mg lisinopril was D/C'ed for pressure of 90/58. Pressure was low yesterday with Dr. Wyonia Hough also - 100/60. She denies chest pain, shortness of breath, lower extremity edema, and palpitations. She completed cardiac rehab. Overall she is doing very well without complaints.   The patient does not have symptoms concerning for COVID-19 infection (fever, chills, cough, or new shortness of breath).    Past Medical History:  Diagnosis Date  . DM type 2 (diabetes mellitus, type 2) ([facility])   . History of hiatal hernia   . Hyperlipidemia   . Hypertension   . Hypothyroidism    Past Surgical History:  Procedure Laterality Date  . ABDOMINAL HYSTERECTOMY  1995  . CARDIAC CATHETERIZATION    . CORONARY STENT INTERVENTION N/A 09/07/2017   Procedure: CORONARY STENT INTERVENTION;  Surgeon: Runell Gess, MD;  Location: [facility];  Service: Cardiovascular;  Laterality: N/A;  . LEFT HEART CATH AND CORONARY ANGIOGRAPHY N/A 09/07/2017   Procedure: LEFT HEART CATH AND CORONARY ANGIOGRAPHY;  Surgeon: Runell Gess, MD;  Location: [facility];  Service: Cardiovascular;  Laterality: N/A;  . STAPEDECTOMY Right ~ 2002     Current Meds  Medication Sig  . aspirin EC 81 MG EC tablet Take 1 tablet (81 mg total) by mouth daily.  [name] atorvastatin (LIPITOR) 80 MG tablet Take 80 mg by mouth daily at 6 PM.  . empagliflozin (JARDIANCE) 10 MG TABS tablet  Take 10 mg by mouth daily.  . fluticasone (FLONASE) 50 MCG/ACT nasal spray Place 2 sprays into both nostrils daily.  [name] gabapentin (NEURONTIN) 100 MG capsule Take 1 capsule (100 mg total) by mouth at bedtime.  [name] levothyroxine (SYNTHROID) 50 MCG tablet Take 1 tablet (50 mcg total) by mouth daily.  . metFORMIN (GLUCOPHAGE-XR) 500 MG 24 hr tablet TAKE 1 TABLET BY MOUTH TWICE DAILY WITH MEALS AS DIRECTED  . ticagrelor (BRILINTA) 90 MG  TABS tablet Take 1 tablet (90 mg total) by mouth 2 (two) times daily. MUST KEEP APPT FOR FUTURE REFILLS     Allergies:   Aspirin and Codeine   Social History   Tobacco Use  . Smoking status: Former Smoker    Packs/day: 0.50    Years: 27.00    Pack years: 13.50    Types: Cigarettes    Last attempt to quit: 09/05/2017    Years since quitting: 1.2  . Smokeless tobacco: Never Used  Substance Use Topics  . Alcohol use: No  . Drug use: No     Family Hx: The patient's family history includes Diabetes in her mother and sister; Heart disease in her brother, mother, and sister; Hyperlipidemia in her father; Stroke in her father.  ROS:   Please see the history of present illness.     All other systems reviewed and are negative.   Prior CV studies:   The following studies were reviewed today:  Left heart cath 09/07/17: Prox LAD to Mid LAD lesion is 90% stenosed. A stent was successfully placed. Post intervention, there is a 0% residual stenosis. IMPRESSION: Successful mid LAD PCI and drug-eluting stent using a synergy to 0.25 mm x 16 mm long DES deployed at 2.36 mm. The patient procedure well. Angiomax will continue full dose 4 hours. The patient was treated with typical post MI pharmacology. DAPT will continue uninterrupted for 12 months.   Echocardiogram 09/06/17: Study Conclusions - Left ventricle: The cavity size was normal. Wall thickness was   normal. Systolic function was normal. The estimated ejection   fraction was in the range of 60% to 65%. Wall motion was normal;   there were no regional wall motion abnormalities. Left   ventricular diastolic function parameters were normal. - Mitral valve: There was mild regurgitation. - Tricuspid valve: There was trivial regurgitation  Labs/Other Tests and Data Reviewed:    EKG:  An ECG dated 06/15/18 was personally reviewed today and demonstrated:  normal sinus rhythm, heart rate 60  Recent Labs: 09/21/2018: ALT 25; BUN 13;  Creatinine, Ser 0.85; Hemoglobin 13.7; Platelets 315; Potassium 4.6; Sodium 138; TSH 1.770   Recent Lipid Panel Lab Results  Component Value Date/Time   CHOL 129 09/21/2018 09:58 AM   TRIG 65 09/21/2018 09:58 AM   HDL 56 09/21/2018 09:58 AM   CHOLHDL 2.3 09/21/2018 09:58 AM   CHOLHDL 5.6 09/05/2017 02:59 AM   LDLCALC 60 09/21/2018 09:58 AM    Wt Readings from Last 3 Encounters:  12/16/18 109 lb (49.4 kg)  12/15/18 109 lb (49.4 kg)  09/21/18 108 lb 3.2 oz (49.1 kg)     Objective:    Vital Signs:  BP (!) 90/58   Pulse (!) 58   Ht 5' 3.5" (1.613 m)   Wt 109 lb (49.4 kg)   BMI 19.01 kg/m    VITAL SIGNS:  reviewed GEN:  no acute distress RESPIRATORY:  normal respiratory effort, symmetric expansion NEURO:  alert and oriented x 3, no obvious focal deficit  PSYCH:  normal affect  ASSESSMENT & PLAN:    1. CAD 2. NSTEMI, DES to LAD (08/2017) - on ASA and brilinta 90 mg BID - she is doing very well, walking daily, they have a treadmill for hot weather - I will reach out to Dr. Tresa EndoKelly for guidance on continuing brilinta, reducing to 60 mg BID, or switching to plavix. Unless there is a different coupon card for 60 mg dose, she will likely need to switch to plavix if DAPT is continued.   3. Hypertension 4. Orthostatic hypotension - lisinopril 2.5 mg D/C'ed by PCP this morning for marginal-hypotensive pressures by her home readings   5. Hyperlipidemia - 09/21/2018: Cholesterol, Total 129; HDL 56; LDL Calculated 60; Triglycerides 65 - continue lipitor - LFT 09/21/18 were WNL   6. DM type II - on jardiance and metformin - last A1c was up to 7.6% from 6.7% 4 months prior - follows with Dr. Elvera LennoxGherghe - she thinks this was because she was instructed to drop her metformin to once a day - Dr. Elvera LennoxGherghe has increased this back to BID - she is walking 1 mile per day   COVID-19 Education: The signs and symptoms of COVID-19 were discussed with the patient and how to seek care for testing  (follow up with PCP or arrange E-visit).  The importance of social distancing was discussed today.  Time:   Today, I have spent 21 minutes with the patient with telehealth technology discussing the above problems.     Medication Adjustments/Labs and Tests Ordered: Current medicines are reviewed at length with the patient today.  Concerns regarding medicines are outlined above.   Tests Ordered: No orders of the defined types were placed in this encounter.   Medication Changes: No orders of the defined types were placed in this encounter.   Disposition:  Follow up in 6 month(s)  Signed, Marcelino Dusterngela Nicole Jenyfer Trawick, PA  12/16/2018 2:04 PM    [facility]

## 2018-12-15 NOTE — Progress Notes (Signed)
Patient ID: Carmen Anderson, female   DOB: 04/08/1961, 58 y.o.   MRN: [id number]  HPI: Carmen Anderson is a 58 y.o.-year-old female, initially referred by her PCP, [place] Reeeborah Gessner, NP, returning for follow-up for DM2, dx in 2001-2002, non-insulin-dependent, now more controlled, with complications (PN). Last visit 4 months ago.  She retired last week.  Sugars were a little bit higher in the last 2 months after she stopped 1 of the 2 metformin daily doses after her last appointment with PCP.  Last hemoglobin A1c was: Lab Results  Component Value Date   HGBA1C 6.7 (A) 08/16/2018   HGBA1C 6.9 (A) 04/13/2018   HGBA1C 6.6 (A) 12/14/2017   HGBA1C 6.9 (H) 09/06/2017   HGBA1C 7 08/14/2017   HGBA1C 6.8 04/13/2017   HGBA1C 6.7 11/25/2016   HGBA1C 7.0 06/03/2016   HGBA1C 7.0 03/04/2016   HGBA1C 7.0 12/03/2015   HGBA1C 7.9 (H) 09/05/2015   HGBA1C 7.7 (H) 05/09/2015   HGBA1C 7.0 10/27/2014   HGBA1C 6.7 04/28/2014   HGBA1C 7.0 12/14/2013   HGBA1C 6.8 08/03/2013   HGBA1C 6.7 01/26/2013   HGBA1C 6.6 10/20/2012   HGBA1C 6.5 04/21/2012   HGBA1C 6.8 01/21/2012   Pt is on a regimen of: - Metformin ER 500 mg twice a day with meals >> 1x a day (decreased by CP in 09/2018) - Januvia 100 mg daily in am >> Jardiance 10 mg before breakfast  Pt checks her sugars twice a day, per review of her excellent log: - am:  94-121 >> 77, 80-128, 132 >> 79-120, 140 - 2h after b'fast: 111-159, 168 >> 139-175 >> 110-179 - before lunch: 84-129 >> 89-122 >> 86-119 >> 75-112 - 2h after lunch: 96-160 >> 122-173 >> 140-181, 199 - before dinner: 96-122 >> 92-120 >> 89-122 >> 87-137, 149 - 2h after dinner: 119-164 >> 147-172 >> 161-177 - bedtime: 96-126 >> 89-128 >> 100-140, 160 - nighttime: n/c >> n/c Lowest sugar was 38 ... >> 89 >> 77; she has hypoglycemia awareness in the 50s. Highestin the 50s 172 >> 168 >> 240.  Glucometer: FreeStyle >> [facility]  Pt's meals are: - snack:  1/2 yoghurt, PB + bread - Breakfast:  wheat waffle + sugar free syrup or wheat muffin w/ 1 egg  - Lunch: salads or [place] sandwich on wheat bread - Dinner: soups, salad, meat 2x a weekly (chicken, salmon) + veggies - Snacks: pineapple, celery  Exercises 4-5x a week on treadmill.  -No CKD, last BUN/creatinine:  Lab Results  Component Value Date   BUN 13 09/21/2018   CREATININE 0.85 09/21/2018  On lisinopril 2.5. -+ HL; last set of lipids: Lab Results  Component Value Date   CHOL 129 09/21/2018   HDL 56 09/21/2018   LDLCALC 60 09/21/2018   TRIG 65 09/21/2018   CHOLHDL 2.3 09/21/2018  On Lipitor, fish oil. - last eye exam was in 07/2017: No DR; [facility].  - + Numbness and tingling in her feet.  On Neurontin and B complex  She had a NSTEMI in 08/2017.  At that time, she had a catheterization and had 90% blockage on LAD.  She had a stent placed.  She also has hypertension and controlled hypothyroidism.  Last TSH normal: Lab Results  Component Value Date   TSH 1.770 09/21/2018   ROS: Constitutional: no weight gain/no weight loss, no fatigue, no subjective hyperthermia, no subjective hypothermia Eyes: no blurry vision, no xerophthalmia ENT: no sore throat, no nodules palpated in neck, no dysphagia,  no odynophagia, no hoarseness Cardiovascular: no CP/no SOB/no palpitations/no leg swelling Respiratory: no cough/no SOB/no wheezing Gastrointestinal: no N/no V/no D/no C/no acid reflux Musculoskeletal: no muscle aches/no joint aches Skin: no rashes, no hair loss Neurological: no tremors/+ numbness/+ tingling/no dizziness  I reviewed pt's medications, allergies, PMH, social hx, family hx, and changes were documented in the history of present illness. Otherwise, unchanged from my initial visit note.  Past Medical History:  Diagnosis Date  . DM type 2 (diabetes mellitus, type 2) (HCC)   . History of hiatal hernia   . Hyperlipidemia   . Hypertension   . Hypothyroidism    Past Surgical History:  Procedure Laterality  Date  . ABDOMINAL HYSTERECTOMY  1995  . CARDIAC CATHETERIZATION    . CORONARY STENT INTERVENTION N/A 09/07/2017   Procedure: CORONARY STENT INTERVENTION;  Surgeon: Runell Gess, MD;  Location: [facility];  Service: Cardiovascular;  Laterality: N/A;  . LEFT HEART CATH AND CORONARY ANGIOGRAPHY N/A 09/07/2017   Procedure: LEFT HEART CATH AND CORONARY ANGIOGRAPHY;  Surgeon: Runell Gess, MD;  Location: [facility];  Service: Cardiovascular;  Laterality: N/A;  . STAPEDECTOMY Right ~ 2002   Social History   Social History  . Marital Status: Married    Spouse Name: N/A  . Number of Children: 0  . Years of Education: 12   Occupational History  . property asst. Production designer, theatre/television/film    Social History Main Topics  . Smoking status: Current Every Day Smoker -- 0.50 packs/day for 10 years    Types: Cigarettes  . Smokeless tobacco: Not on file     Comment: pt states thinking of quitting  . Alcohol Use: No  . Drug Use: No   Social History Narrative   Exercise: walking, 4 times/week for 30 minutes.   Current Outpatient Medications on File Prior to Visit  Medication Sig Dispense Refill  . aspirin EC 81 MG EC tablet Take 1 tablet (81 mg total) by mouth daily.    [name] atorvastatin (LIPITOR) 80 MG tablet Take 1 tablet (80 mg total) by mouth 2 (two) times daily. 90 tablet 3  . empagliflozin (JARDIANCE) 10 MG TABS tablet Take 10 mg by mouth daily. 90 mg 3  . fluticasone (FLONASE) 50 MCG/ACT nasal spray Place 2 sprays into both nostrils daily. 16 g 6  . gabapentin (NEURONTIN) 100 MG capsule TAKE 1 CAPSULE (100 MG TOTAL) BY MOUTH AT BEDTIME. 90 capsule 3  . levothyroxine (SYNTHROID, LEVOTHROID) 50 MCG tablet TAKE 1 TABLET BY MOUTH EVERY DAY 90 tablet 3  . lisinopril (PRINIVIL,ZESTRIL) 2.5 MG tablet Take 1 tablet (2.5 mg total) by mouth daily. 90 tablet 3  . metFORMIN (GLUCOPHAGE-XR) 500 MG 24 hr tablet TAKE 1 TABLET BY MOUTH TWICE DAILY WITH MEALS AS DIRECTED 180 tablet 3  . nitroGLYCERIN  (NITROSTAT) 0.4 MG SL tablet Place 1 tablet (0.4 mg total) under the tongue every 5 (five) minutes x 3 doses as needed for chest pain. 25 tablet 3  . ticagrelor (BRILINTA) 90 MG TABS tablet Take 1 tablet (90 mg total) by mouth 2 (two) times daily. MUST KEEP APPT FOR FUTURE REFILLS 180 tablet 0   No current facility-administered medications on file prior to visit.    Allergies  Allergen Reactions  . Aspirin Other (See Comments)    Burns stomach  . Codeine Rash   Family History  Problem Relation Age of Onset  . Diabetes Mother   . Heart disease Mother   . Stroke  Father        2011  . Hyperlipidemia Father   . Diabetes Sister   . Heart disease Sister   . Heart disease Brother        congenital   PE: BP 100/60   Pulse (!) 51   Ht 5' 3.5" (1.613 m)   Wt 109 lb (49.4 kg)   SpO2 97%   BMI 19.01 kg/m  Body mass index is 19.01 kg/m. Wt Readings from Last 3 Encounters:  12/15/18 109 lb (49.4 kg)  09/21/18 108 lb 3.2 oz (49.1 kg)  08/16/18 108 lb (49 kg)   Constitutional: Normal weight, in NAD Eyes: PERRLA, EOMI, no exophthalmos ENT: moist mucous membranes, no thyromegaly, no cervical lymphadenopathy Cardiovascular: RRR, No MRG Respiratory: CTA B Gastrointestinal: abdomen soft, NT, ND, BS+ Musculoskeletal: no deformities, strength intact in all 4 Skin: moist, warm, no rashes Neurological: no tremor with outstretched hands, DTR normal in all 4  ASSESSMENT: 1. DM2, non-insulin-dependent, now more controlled, with complications - PN   2. PN   3. HL  PLAN:  1. Patient with longstanding, uncontrolled, type 2 diabetes.  She had an end STEMI in 08/2017 and had a stent placed.  We started Jardiance after this.  No side effects from the medication.  She is trying to stay well-hydrated.  She continues exercising and after finishing cardiac rehab. -At last visit, sugars were slightly higher due to the holidays and her father passing away.  However, she continued to pay attention to  her diet and to exercise.  Since sugars were not very close to goal, we did not change her regimen at that time. -At this visit, her sugars appear to be higher after stopping 1 of the 2 daily metformin doses at last visit with PCP in 09/2018.  They appear to increase as the day goes by.  The majority of the sugars are still at goal, though.  Since today HbA1c is 7.6% (higher), will advise him to back metformin. - I suggested to:  Patient Instructions  Please increase: - Metformin ER 500 mg 2x a day with meals  Continue: - Jardiance 10 mg before breakfast  Please return in 4 months with your sugar log.  - continue checking sugars at different times of the day - check 1x a day, rotating checks - advised for yearly eye exams >> she is not UTD - refilled her DM meds - Return to clinic in 4 mo with sugar log      2. Diabetic PN -Stable, no new complaints -She continues on: -B complex 1 tablet daily -Neurontin 100 mg daily  3. HL - Reviewed latest lipid panel: Excellent Lab Results  Component Value Date   CHOL 129 09/21/2018   HDL 56 09/21/2018   LDLCALC 60 09/21/2018   TRIG 65 09/21/2018   CHOLHDL 2.3 09/21/2018  - Continues the statin and fish oil without side effects.  Carlus Pavlov, MD PhD [facility] Endocrinology

## 2018-12-16 ENCOUNTER — Encounter: Payer: Self-pay | Admitting: Physician Assistant

## 2018-12-16 ENCOUNTER — Telehealth (INDEPENDENT_AMBULATORY_CARE_PROVIDER_SITE_OTHER): Payer: BC Managed Care – PPO | Admitting: Physician Assistant

## 2018-12-16 ENCOUNTER — Ambulatory Visit (INDEPENDENT_AMBULATORY_CARE_PROVIDER_SITE_OTHER): Payer: BC Managed Care – PPO | Admitting: Family Medicine

## 2018-12-16 VITALS — BP 90/58 | HR 58 | Ht 63.5 in | Wt 109.0 lb

## 2018-12-16 VITALS — BP 90/58 | HR 58 | Temp 97.9°F | Resp 18

## 2018-12-16 DIAGNOSIS — E1159 Type 2 diabetes mellitus with other circulatory complications: Secondary | ICD-10-CM

## 2018-12-16 DIAGNOSIS — I2510 Atherosclerotic heart disease of native coronary artery without angina pectoris: Secondary | ICD-10-CM

## 2018-12-16 DIAGNOSIS — E785 Hyperlipidemia, unspecified: Secondary | ICD-10-CM

## 2018-12-16 DIAGNOSIS — I9589 Other hypotension: Secondary | ICD-10-CM

## 2018-12-16 DIAGNOSIS — I10 Essential (primary) hypertension: Secondary | ICD-10-CM

## 2018-12-16 DIAGNOSIS — Z760 Encounter for issue of repeat prescription: Secondary | ICD-10-CM

## 2018-12-16 DIAGNOSIS — I214 Non-ST elevation (NSTEMI) myocardial infarction: Secondary | ICD-10-CM

## 2018-12-16 MED ORDER — GABAPENTIN 100 MG PO CAPS: 100.0000 mg | ORAL_CAPSULE | Freq: Every day | ORAL | 3 refills | Status: DC

## 2018-12-16 MED ORDER — LEVOTHYROXINE SODIUM 50 MCG PO TABS: 50.0000 ug | ORAL_TABLET | Freq: Every day | ORAL | 3 refills | Status: DC

## 2018-12-16 NOTE — Patient Instructions (Addendum)
If you have lab work done today you will be contacted with your lab results within the next 2 weeks.  If you have not heard from us then please contact us. The fastest way to get your results is to register for My Chart.   IF you received an x-ray today, you will receive an invoice from [facility]. Please contact [facility] at [phone] with questions or concerns regarding your invoice.   IF you received labwork today, you will receive an invoice from [facility]. Please contact [facility] at [phone] with questions or concerns regarding your invoice.   Our billing staff will not be able to assist you with questions regarding bills from these companies.  You will be contacted with the lab results as soon as they are available. The fastest way to get your results is to activate your My Chart account. Instructions are located on the last page of this paperwork. If you have not heard from us regarding the results in 2 weeks, please contact this office.      Hypotension As your heart beats, it forces blood through your body. Hypotension, commonly called low blood pressure, is when the force of blood pumping through your arteries is too weak. Arteries are blood vessels that carry blood from the heart throughout the body. Depending on the cause and severity, hypotension may be harmless (benign) or may cause serious problems (be critical). When blood pressure is too low, you may not get enough blood to your brain or to the rest of your organs. This can cause weakness, light-headedness, rapid heartbeat, and fainting. What are the causes? This condition may be caused by:  Blood loss.  Loss of body fluids (dehydration).  Heart problems.  Hormone (endocrine) problems.  Pregnancy.  Severe infection.  Lack of certain nutrients.  Severe allergic reactions (anaphylaxis).  Certain medicines, such as blood pressure medicine or medicines that make the body lose excess  fluids (diuretics). Sometimes, hypotension may be caused by not taking medicine as directed, such as taking too much of a certain medicine. What increases the risk? The following factors may make you more likely to develop this condition:  Age. Risk increases as you get older.  Conditions that affect the heart or the central nervous system.  Taking certain medicines, such as blood pressure medicine or diuretics.  Being pregnant. What are the signs or symptoms? Common symptoms of this condition include:  Weakness.  Light-headedness.  Dizziness.  Blurred vision.  Fatigue.  Rapid heartbeat.  Fainting, in severe cases. How is this diagnosed? This condition is diagnosed based on:  Your medical history.  Your symptoms.  Your blood pressure measurement. Your health care provider will check your blood pressure when you are: ? Lying down. ? Sitting. ? Standing. A blood pressure reading is recorded as two numbers, such as "120 over 80" (or 120/80). The first ("top") number is called the systolic pressure. It is a measure of the pressure in your arteries as your heart beats. The second ("bottom") number is called the diastolic pressure. It is a measure of the pressure in your arteries when your heart relaxes between beats. Blood pressure is measured in a unit called mm Hg. Healthy blood pressure for most adults is 120/80. If your blood pressure is below 90/60, you may be diagnosed with hypotension. Other information or tests that may be used to diagnose hypotension include:  Your other vital signs, such as your heart rate and temperature.  Blood tests.  Tilt  table test. For this test, you will be safely secured to a table that moves you from a lying position to an upright position. Your heart rhythm and blood pressure will be monitored during the test. How is this treated? Treatment for this condition may include:  Changing your diet. This may involve eating more salt (sodium)  or drinking more water.  Taking medicines to raise your blood pressure.  Changing the dosage of certain medicines you are taking that might be lowering your blood pressure.  Wearing compression stockings. These stockings help to prevent blood clots and reduce swelling in your legs. In some cases, you may need to go to the hospital for:  Fluid replacement. This means you will receive fluids through an IV.  Blood replacement. This means you will receive donated blood through an IV (transfusion).  Treating an infection or heart problems, if this applies.  Monitoring. You may need to be monitored while medicines that you are taking wear off. Follow these instructions at home: Eating and drinking   Drink enough fluid to keep your urine pale yellow.  Eat a healthy diet, and follow instructions from your health care provider about eating or drinking restrictions. A healthy diet includes: ? Fresh fruits and vegetables. ? Whole grains. ? Lean meats. ? Low-fat dairy products.  Eat extra salt only as directed. Do not add extra salt to your diet unless your health care provider told you to do that.  Eat frequent, small meals.  Avoid standing up suddenly after eating. Medicines  Take over-the-counter and prescription medicines only as told by your health care provider. ? Follow instructions from your health care provider about changing the dosage of your current medicines, if this applies. ? Do not stop or adjust any of your medicines on your own. General instructions   Wear compression stockings as told by your health care provider.  Get up slowly from lying down or sitting positions. This gives your blood pressure a chance to adjust.  Avoid hot showers and excessive heat as directed by your health care provider.  Return to your normal activities as told by your health care provider. Ask your health care provider what activities are safe for you.  Do not use any products that  contain nicotine or tobacco, such as cigarettes, e-cigarettes, and chewing tobacco. If you need help quitting, ask your health care provider.  Keep all follow-up visits as told by your health care provider. This is important. Contact a health care provider if you:  Vomit.  Have diarrhea.  Have a fever for more than 2-3 days.  Feel more thirsty than usual.  Feel weak and tired. Get help right away if you:  Have chest pain.  Have a fast or irregular heartbeat.  Develop numbness in any part of your body.  Cannot move your arms or your legs.  Have trouble speaking.  Become sweaty or feel light-headed.  Faint.  Feel short of breath.  Have trouble staying awake.  Feel confused. Summary  Hypotension is when the force of blood pumping through your arteries is too weak.  Hypotension may be harmless (benign) or may cause serious problems (be critical).  Treatment for this condition may include changing your diet, changing your medicines, and wearing compression stockings.  In some cases, you may need to go to the hospital for fluid or blood replacement. This information is not intended to replace advice given to you by your health care provider. Make sure you discuss any questions you  have with your health care provider. Document Released: 06/30/2005 Document Revised: 12/24/2017 Document Reviewed: 12/24/2017 Elsevier Interactive Patient Education  [employer].

## 2018-12-16 NOTE — Progress Notes (Signed)
Established Patient Office Visit  Subjective:  Patient ID: Carmen Anderson, female    DOB: 06/29/1961  Age: 58 y.o. MRN: [id number]  CC:  Chief Complaint  Patient presents with  . chronic conditions    3 m f/u   . Medication Refill    gapentin and synthroid     HPI Carmen LevyCarla L Gattuso presents for   Patient retired a week ago  Hypertension: Patient here for follow-up of elevated blood pressure. She is exercising and is adherent to low salt diet.  Blood pressure is well controlled at home. Cardiac symptoms none. Patient denies dizziness, chest pressure/discomfort, dyspnea and exertional chest pressure/discomfort.  Cardiovascular risk factors: diabetes mellitus and hypertension. Use of agents associated with hypertension: none. History of target organ damage: angina/ prior myocardial infarction.  She has been checking home bps  Readings 90s/60s Her lisinopril 5mg  was halved to 2.5mg  by Cardiology She is now retired She is exercising by walking  She is compliant with her meds and diet   Diabetes Mellitus: Patient presents for follow up of diabetes. Symptoms: hyperglycemia. Symptoms have gradually improved. Patient denies hypoglycemia , increase appetite, nausea and paresthesia of the feet.  Evaluation to date has been included: hemoglobin A1C.  Home sugars: patient does not check sugars.  Lab Results  Component Value Date   HGBA1C 7.6 (A) 12/15/2018   She is now taking the metformin 500mg  bid She had her visit on 12/15/2018 with Dr. Elvera LennoxGherghe She is monitoring her home glucose   Past Medical History:  Diagnosis Date  . DM type 2 (diabetes mellitus, type 2) (HCC)   . History of hiatal hernia   . Hyperlipidemia   . Hypertension   . Hypothyroidism     Past Surgical History:  Procedure Laterality Date  . ABDOMINAL HYSTERECTOMY  1995  . CARDIAC CATHETERIZATION    . CORONARY STENT INTERVENTION N/A 09/07/2017   Procedure: CORONARY STENT INTERVENTION;  Surgeon: Runell GessBerry, Jonathan J, MD;   Location: [facility];  Service: Cardiovascular;  Laterality: N/A;  . LEFT HEART CATH AND CORONARY ANGIOGRAPHY N/A 09/07/2017   Procedure: LEFT HEART CATH AND CORONARY ANGIOGRAPHY;  Surgeon: Runell GessBerry, Jonathan J, MD;  Location: [facility];  Service: Cardiovascular;  Laterality: N/A;  . STAPEDECTOMY Right ~ 2002    Family History  Problem Relation Age of Onset  . Diabetes Mother   . Heart disease Mother   . Stroke Father        2011  . Hyperlipidemia Father   . Diabetes Sister   . Heart disease Sister   . Heart disease Brother        congenital    Social History   Socioeconomic History  . Marital status: Married    Spouse name: Not on file  . Number of children: Not on file  . Years of education: 5812  . Highest education level: Not on file  Occupational History  . Occupation: property asst. Event organisermanager    Employer: [employer]  Social Needs  . Financial resource strain: Not on file  . Food insecurity:    Worry: Not on file    Inability: Not on file  . Transportation needs:    Medical: Not on file    Non-medical: Not on file  Tobacco Use  . Smoking status: Former Smoker    Packs/day: 0.50    Years: 27.00    Pack years: 13.50    Types: Cigarettes    Last attempt to quit:  09/05/2017    Years since quitting: 1.2  . Smokeless tobacco: Never Used  Substance and Sexual Activity  . Alcohol use: No  . Drug use: No  . Sexual activity: Yes    Birth control/protection: None  Lifestyle  . Physical activity:    Days per week: Not on file    Minutes per session: Not on file  . Stress: Not on file  Relationships  . Social connections:    Talks on phone: Not on file    Gets together: Not on file    Attends religious service: Not on file    Active member of club or organization: Not on file    Attends meetings of clubs or organizations: Not on file    Relationship status: Not on file  . Intimate partner violence:    Fear of current or ex partner:  Not on file    Emotionally abused: Not on file    Physically abused: Not on file    Forced sexual activity: Not on file  Other Topics Concern  . Not on file  Social History Narrative   Exercise: walking, 4 times/week for 30 minutes.    Outpatient Medications Prior to Visit  Medication Sig Dispense Refill  . aspirin EC 81 MG EC tablet Take 1 tablet (81 mg total) by mouth daily.    [name] atorvastatin (LIPITOR) 80 MG tablet Take 1 tablet (80 mg total) by mouth 2 (two) times daily. 90 tablet 3  . empagliflozin (JARDIANCE) 10 MG TABS tablet Take 10 mg by mouth daily. 90 mg 3  . fluticasone (FLONASE) 50 MCG/ACT nasal spray Place 2 sprays into both nostrils daily. 16 g 6  . gabapentin (NEURONTIN) 100 MG capsule TAKE 1 CAPSULE (100 MG TOTAL) BY MOUTH AT BEDTIME. 90 capsule 3  . levothyroxine (SYNTHROID, LEVOTHROID) 50 MCG tablet TAKE 1 TABLET BY MOUTH EVERY DAY 90 tablet 3  . lisinopril (PRINIVIL,ZESTRIL) 2.5 MG tablet Take 1 tablet (2.5 mg total) by mouth daily. 90 tablet 3  . metFORMIN (GLUCOPHAGE-XR) 500 MG 24 hr tablet TAKE 1 TABLET BY MOUTH TWICE DAILY WITH MEALS AS DIRECTED 180 tablet 3  . nitroGLYCERIN (NITROSTAT) 0.4 MG SL tablet Place 1 tablet (0.4 mg total) under the tongue every 5 (five) minutes x 3 doses as needed for chest pain. 25 tablet 3  . ticagrelor (BRILINTA) 90 MG TABS tablet Take 1 tablet (90 mg total) by mouth 2 (two) times daily. MUST KEEP APPT FOR FUTURE REFILLS 180 tablet 0   No facility-administered medications prior to visit.     Allergies  Allergen Reactions  . Aspirin Other (See Comments)    Burns stomach  . Codeine Rash    ROS Review of Systems Review of Systems  Constitutional: Negative for activity change, appetite change, chills and fever.  HENT: Negative for congestion, nosebleeds, trouble swallowing and voice change.   Respiratory: Negative for cough, shortness of breath and wheezing.   Gastrointestinal: Negative for diarrhea, nausea and vomiting.   Genitourinary: Negative for difficulty urinating, dysuria, flank pain and hematuria.  Musculoskeletal: Negative for back pain, joint swelling and neck pain.  Neurological: Negative for dizziness, speech difficulty, light-headedness and numbness.  See HPI. All other review of systems negative.     Objective:    Physical Exam  BP (!) 90/58 (BP Location: Right Arm, Patient Position: Sitting, Cuff Size: Small)   Pulse (!) 58   Temp 97.9 F (36.6 C) (Oral)   Resp 18   SpO2 100%  Wt Readings from Last 3 Encounters:  12/15/18 109 lb (49.4 kg)  09/21/18 108 lb 3.2 oz (49.1 kg)  08/16/18 108 lb (49 kg)   Physical Exam  Constitutional: Oriented to person, place, and time. Appears well-developed and well-nourished.  HENT:  Head: Normocephalic and atraumatic.  Eyes: Conjunctivae and EOM are normal.  Cardiovascular: Normal rate, regular rhythm, normal heart sounds and intact distal pulses.  No murmur heard. Pulmonary/Chest: Effort normal and breath sounds normal. No stridor. No respiratory distress. Has no wheezes.  Neurological: Is alert and oriented to person, place, and time.  Skin: Skin is warm. Capillary refill takes less than 2 seconds.  Psychiatric: Has a normal mood and affect. Behavior is normal. Judgment and thought content normal.    Health Maintenance Due  Topic Date Due  . OPHTHALMOLOGY EXAM  07/17/2018  . MAMMOGRAM  07/29/2018    There are no preventive care reminders to display for this patient.  Lab Results  Component Value Date   TSH 1.770 09/21/2018   Lab Results  Component Value Date   WBC 12.3 (H) 09/21/2018   HGB 13.7 09/21/2018   HCT 41.1 09/21/2018   MCV 93 09/21/2018   PLT 315 09/21/2018   Lab Results  Component Value Date   NA 138 09/21/2018   K 4.6 09/21/2018   CO2 16 (L) 09/21/2018   GLUCOSE 130 (H) 09/21/2018   BUN 13 09/21/2018   CREATININE 0.85 09/21/2018   BILITOT 0.4 09/21/2018   ALKPHOS 63 09/21/2018   AST 20 09/21/2018   ALT 25  09/21/2018   PROT 6.7 09/21/2018   ALBUMIN 4.9 09/21/2018   CALCIUM 9.7 09/21/2018   ANIONGAP 7 09/08/2017   Lab Results  Component Value Date   CHOL 129 09/21/2018   Lab Results  Component Value Date   HDL 56 09/21/2018   Lab Results  Component Value Date   LDLCALC 60 09/21/2018   Lab Results  Component Value Date   TRIG 65 09/21/2018   Lab Results  Component Value Date   CHOLHDL 2.3 09/21/2018   Lab Results  Component Value Date   HGBA1C 7.6 (A) 12/15/2018      Assessment & Plan:   Problem List Items Addressed This Visit      Cardiovascular and Mediastinum   Diabetes mellitus with cardiac complication (HCC)  a1c increased Endocrinology increased the metformin back to  bid with plan for her to follow up in 4 months     Other   Iatrogenic hypotension - Primary  Based on current home monitoring and the overall trend pt is having more low bp readings even on the lowest dose of lisinopril She should stop her lisinopril at this time She has an appointment with Dr. Landry Dyke office 12/17/2018 Pt to discuss with them       No orders of the defined types were placed in this encounter.   Follow-up: No follow-ups on file.   A total of 27 minutes were spent face-to-face with the patient during this encounter and over half of that time was spent on counseling and coordination of care.  Doristine Bosworth, MD

## 2018-12-16 NOTE — Patient Instructions (Signed)
Medication Instructions:  Your physician recommends that you continue on your current medications as directed. Please refer to the Current Medication list given to you today.  If you need a refill on your cardiac medications before your next appointment, please call your pharmacy.   Follow-Up: At [facility], you and your health needs are our priority.  As part of our continuing mission to provide you with exceptional heart care, we have created designated Provider Care Teams.  These Care Teams include your primary Cardiologist (physician) and Advanced Practice Providers (APPs -  Physician Assistants and Nurse Practitioners) who all work together to provide you with the care you need, when you need it. You will need a follow up appointment in 6 months.  Please call our office 2 months in advance to schedule this appointment.  You may see Thomas Kelly, MD or one of the following Advanced Practice Providers on your designated Care Team: Hao Meng, PA-C . Angela Duke, PA-C  Any Other Special Instructions Will Be Listed Below (If Applicable). None   

## 2018-12-17 ENCOUNTER — Ambulatory Visit: Payer: BLUE CROSS/BLUE SHIELD | Admitting: Cardiovascular Disease

## 2018-12-20 ENCOUNTER — Encounter: Payer: Self-pay | Admitting: Family Medicine

## 2018-12-20 NOTE — Progress Notes (Signed)
ok 

## 2018-12-21 ENCOUNTER — Other Ambulatory Visit: Payer: Self-pay | Admitting: Cardiovascular Disease

## 2018-12-22 ENCOUNTER — Telehealth: Payer: Self-pay | Admitting: Physician Assistant

## 2018-12-22 DIAGNOSIS — I214 Non-ST elevation (NSTEMI) myocardial infarction: Secondary | ICD-10-CM

## 2018-12-22 MED ORDER — CLOPIDOGREL BISULFATE 75 MG PO TABS: 75.0000 mg | ORAL_TABLET | Freq: Every day | ORAL | 3 refills | Status: DC

## 2018-12-22 NOTE — Telephone Encounter (Signed)
In consultation with Dr. Claiborne Billings, he would like to keep her on on DAPT. She may switch to plavix due to cost.  New script sent to her pharmacy. I called the patient and discussed the recommendation. She will finish out 6 days of her remaining brilinta and then switch to 75 mg plavix.  Tami Lin Ayame Rena, PA-C 12/22/2018, 8:35 AM

## 2019-01-06 ENCOUNTER — Telehealth: Payer: Self-pay

## 2019-01-06 NOTE — Telephone Encounter (Signed)
   [facility] Pre-operative Risk Assessment    Request for surgical clearance:  1. What type of surgery is being performed? Free Soft Gingival Graft   2. When is this surgery scheduled? Not specified   3. What type of clearance is required (medical clearance vs. Pharmacy clearance to hold med vs. Both)? Pharmacy  4. Are there any medications that need to be held prior to surgery and how long? Plavix for 5 days prior to gum surgery   5. Practice name and name of physician performing surgery? Rush Farmer, D.D.S., M.S.; [facility]   6. What is your office phone number [phone]    7.   What is your office fax number [phone]  8.   Anesthesia type (None, local, MAC, general) ? Not specified   Therisa Doyne 01/06/2019, 3:40 PM  _________________________________________________________________   (provider comments below)

## 2019-01-07 NOTE — Telephone Encounter (Signed)
Dr. Claiborne Billings to review, last PCI was on 09/07/2017 with DES to prox LAD. Ok to hold plavix for 5 days prior to gingival surgery? Please send your reply to [facility] PREOP

## 2019-01-10 NOTE — Telephone Encounter (Signed)
Ok to hold for 5 days

## 2019-01-11 NOTE — Telephone Encounter (Signed)
Left voice mail

## 2019-01-13 NOTE — Telephone Encounter (Signed)
   Primary Cardiologist: Shelva Majestic, MD  Chart reviewed as part of pre-operative protocol coverage. Given past medical history and time since last visit, based on ACC/AHA guidelines, Carmen Anderson would be at acceptable risk for the planned procedure without further cardiovascular testing.   OK to hold Plavix 5 days pre op if needed.  I will route this recommendation to the requesting party via Epic fax function and remove from pre-op pool.  Please call with questions.  Kerin Ransom, PA-C 01/13/2019, 4:47 PM

## 2019-01-13 NOTE — Telephone Encounter (Signed)
Follow up  ° ° °Patient is returning call.  °

## 2019-01-18 NOTE — Telephone Encounter (Signed)
Patient would like someone to call her and go over the details of her Preop Clearance again.

## 2019-01-19 NOTE — Telephone Encounter (Signed)
Patient called again would like for someone to call her go over instructions of clearance.

## 2019-01-19 NOTE — Telephone Encounter (Signed)
I was able to reach Mrs. Carmen Anderson after a second call, she says her question has been answered. She is aware she will need to hold plavix for 5 days prior to the procedure.

## 2019-04-18 ENCOUNTER — Other Ambulatory Visit: Payer: Self-pay

## 2019-04-20 ENCOUNTER — Ambulatory Visit: Payer: BC Managed Care – PPO | Admitting: Internal Medicine

## 2019-04-22 DIAGNOSIS — Z23 Encounter for immunization: Secondary | ICD-10-CM | POA: Diagnosis not present

## 2019-05-16 ENCOUNTER — Other Ambulatory Visit: Payer: Self-pay

## 2019-05-18 ENCOUNTER — Ambulatory Visit: Payer: BC Managed Care – PPO | Admitting: Internal Medicine

## 2019-05-18 ENCOUNTER — Encounter: Payer: Self-pay | Admitting: Internal Medicine

## 2019-05-18 VITALS — BP 100/60 | HR 60 | Ht 63.5 in | Wt 114.0 lb

## 2019-05-18 DIAGNOSIS — E785 Hyperlipidemia, unspecified: Secondary | ICD-10-CM

## 2019-05-18 DIAGNOSIS — E1142 Type 2 diabetes mellitus with diabetic polyneuropathy: Secondary | ICD-10-CM | POA: Diagnosis not present

## 2019-05-18 DIAGNOSIS — E1159 Type 2 diabetes mellitus with other circulatory complications: Secondary | ICD-10-CM | POA: Diagnosis not present

## 2019-05-18 DIAGNOSIS — E1165 Type 2 diabetes mellitus with hyperglycemia: Secondary | ICD-10-CM | POA: Diagnosis not present

## 2019-05-18 LAB — POCT GLYCOSYLATED HEMOGLOBIN (HGB A1C): Hemoglobin A1C: 6.4 % — AB (ref 4.0–5.6)

## 2019-05-18 NOTE — Addendum Note (Signed)
Addended by: Cardell Peach I on: 05/18/2019 10:03 AM   Modules accepted: Orders

## 2019-05-18 NOTE — Patient Instructions (Signed)
Please continue: - Metformin ER 500 mg 2x a day with meals - Jardiance 10 mg before breakfast  Please return in 4 months with your sugar log. 

## 2019-05-18 NOTE — Progress Notes (Signed)
Patient ID: Carmen Anderson, female   DOB: 05/06/1961, 58 y.o.   MRN: [id number]  HPI: Carmen LevyCarla L Anderson is a 58 y.o.-year-old female, initially referred by her PCP, Olean Reeeborah Gessner, NP, returning for follow-up for DM2, dx in 2001-2002, non-insulin-dependent, now more controlled, with complications (CAD - s/p NSTEMI 2019, s/p stent; PN). Last visit 5 months ago.  She retired before last visit.  At last visit, she was on higher after she reduced her Metformin dose for concerns of weight loss. Now back on 2x a day Metformin.   Last hemoglobin A1c was: Lab Results  Component Value Date   HGBA1C 7.6 (A) 12/15/2018   HGBA1C 6.7 (A) 08/16/2018   HGBA1C 6.9 (A) 04/13/2018   HGBA1C 6.6 (A) 12/14/2017   HGBA1C 6.9 (H) 09/06/2017   HGBA1C 7 08/14/2017   HGBA1C 6.8 04/13/2017   HGBA1C 6.7 11/25/2016   HGBA1C 7.0 06/03/2016   HGBA1C 7.0 03/04/2016   HGBA1C 7.0 12/03/2015   HGBA1C 7.9 (H) 09/05/2015   HGBA1C 7.7 (H) 05/09/2015   HGBA1C 7.0 10/27/2014   HGBA1C 6.7 04/28/2014   HGBA1C 7.0 12/14/2013   HGBA1C 6.8 08/03/2013   HGBA1C 6.7 01/26/2013   HGBA1C 6.6 10/20/2012   HGBA1C 6.5 04/21/2012   Pt is on a regimen of: - Metformin ER 500 mg once a day (decreased by PCP in 09/2018) >> increased back to twice a day 12/2018 - Januvia 100 mg daily in am >> Jardiance 10 mg before breakfast  Pt checks her sugars twice a day per review of her excellent log - am:  94-121 >> 77, 80-128, 132 >> 79-120, 140 >> 95-120, 127 - 2h after b'fast: 111-159, 168 >> 139-175 >> 110-179 >> 124-178 - before lunch: 89-122 >> 86-119 >> 75-112 >> 84-111 - 2h after lunch: 96-160 >> 122-173 >> 140-181, 199 >> 140-169, 176 - before dinner: 92-120 >> 89-122 >> 87-137, 149 >> 88-124, 160 - 2h after dinner: 119-164 >> 147-172 >> 161-177 >> 135-177, 191 - bedtime: 96-126 >> 89-128 >> 100-140, 160 >> 97-125, 159 - nighttime: n/c >> n/c Lowest sugar was 38 ... >> 89 >> 77 >> 88; she has hypoglycemia awareness in the  50s. Highestin the 50s 172 >> 168 >> 240 >> 191.  Glucometer: FreeStyle >> [facility]  Pt's meals are: - snack:  1/2 yoghurt, PB + bread - Breakfast: wheat waffle + sugar free syrup or wheat muffin w/ 1 egg  - Lunch: salads or [place] sandwich on wheat bread - Dinner: soups, salad, meat 2x a weekly (chicken, salmon) + veggies - Snacks: pineapple, celery  She exercises 4-5 times a week on the treadmill.  -No CKD, last BUN/creatinine:  Lab Results  Component Value Date   BUN 13 09/21/2018   CREATININE 0.85 09/21/2018  On lisinopril 2.5. -+ HL; last set of lipids: Lab Results  Component Value Date   CHOL 129 09/21/2018   HDL 56 09/21/2018   LDLCALC 60 09/21/2018   TRIG 65 09/21/2018   CHOLHDL 2.3 09/21/2018  On Lipitor, fish oil - last eye exam was in 02/2019: No DR; [facility].  -+ Numbness and tingling in her feet.  On Neurontin and B complex.  She had a NSTEMI in 08/2017.  At that time, she had a catheterization and had 90% blockage on LAD.  She had a stent placed.   She also has HTN and controlled hypothyroidism.  Last TSH normal: Lab Results  Component Value Date   TSH 1.770 09/21/2018  ROS: Constitutional: + weight gain/no weight loss, no fatigue, no subjective hyperthermia, no subjective hypothermia Eyes: no blurry vision, no xerophthalmia ENT: no sore throat, no nodules palpated in neck, no dysphagia, no odynophagia, no hoarseness Cardiovascular: no CP/no SOB/no palpitations/no leg swelling Respiratory: no cough/no SOB/no wheezing Gastrointestinal: no N/no V/no D/no C/no acid reflux Musculoskeletal: no muscle aches/no joint aches Skin: no rashes, no hair loss Neurological: no tremors/+ numbness/+ tingling/no dizziness  I reviewed pt's medications, allergies, PMH, social hx, family hx, and changes were documented in the history of present illness. Otherwise, unchanged from my initial visit note.  Past Medical History:  Diagnosis Date  . DM type 2 (diabetes  mellitus, type 2) (HCC)   . History of hiatal hernia   . Hyperlipidemia   . Hypertension   . Hypothyroidism    Past Surgical History:  Procedure Laterality Date  . ABDOMINAL HYSTERECTOMY  1995  . CARDIAC CATHETERIZATION    . CORONARY STENT INTERVENTION N/A 09/07/2017   Procedure: CORONARY STENT INTERVENTION;  Surgeon: Runell Gess, MD;  Location: [facility];  Service: Cardiovascular;  Laterality: N/A;  . LEFT HEART CATH AND CORONARY ANGIOGRAPHY N/A 09/07/2017   Procedure: LEFT HEART CATH AND CORONARY ANGIOGRAPHY;  Surgeon: Runell Gess, MD;  Location: [facility];  Service: Cardiovascular;  Laterality: N/A;  . STAPEDECTOMY Right ~ 2002   Social History   Social History  . Marital Status: Married    Spouse Name: N/A  . Number of Children: 0  . Years of Education: 12   Occupational History  . property asst. Production designer, theatre/television/film    Social History Main Topics  . Smoking status: Current Every Day Smoker -- 0.50 packs/day for 10 years    Types: Cigarettes  . Smokeless tobacco: Not on file     Comment: pt states thinking of quitting  . Alcohol Use: No  . Drug Use: No   Social History Narrative   Exercise: walking, 4 times/week for 30 minutes.   Current Outpatient Medications on File Prior to Visit  Medication Sig Dispense Refill  . aspirin EC 81 MG EC tablet Take 1 tablet (81 mg total) by mouth daily.    [name] atorvastatin (LIPITOR) 80 MG tablet Take 80 mg by mouth daily at 6 PM.    . clopidogrel (PLAVIX) 75 MG tablet Take 1 tablet (75 mg total) by mouth daily. 90 tablet 3  . empagliflozin (JARDIANCE) 10 MG TABS tablet Take 10 mg by mouth daily. 90 mg 3  . fluticasone (FLONASE) 50 MCG/ACT nasal spray Place 2 sprays into both nostrils daily. 16 g 6  . gabapentin (NEURONTIN) 100 MG capsule Take 1 capsule (100 mg total) by mouth at bedtime. 90 capsule 3  . levothyroxine (SYNTHROID) 50 MCG tablet Take 1 tablet (50 mcg total) by mouth daily. 90 tablet 3  . metFORMIN  (GLUCOPHAGE-XR) 500 MG 24 hr tablet TAKE 1 TABLET BY MOUTH TWICE DAILY WITH MEALS AS DIRECTED 180 tablet 3  . nitroGLYCERIN (NITROSTAT) 0.4 MG SL tablet Place 1 tablet (0.4 mg total) under the tongue every 5 (five) minutes x 3 doses as needed for chest pain. (Patient not taking: Reported on 12/16/2018) 25 tablet 3  . ticagrelor (BRILINTA) 90 MG TABS tablet Take 1 tablet (90 mg total) by mouth 2 (two) times daily. 180 tablet 2   No current facility-administered medications on file prior to visit.    Allergies  Allergen Reactions  . Aspirin Other (See Comments)    Lawerance Bach  stomach  . Codeine Rash   Family History  Problem Relation Age of Onset  . Diabetes Mother   . Heart disease Mother   . Stroke Father        2011  . Hyperlipidemia Father   . Diabetes Sister   . Heart disease Sister   . Heart disease Brother        congenital   PE: BP 100/60   Pulse 60   Ht 5' 3.5" (1.613 m) Comment: measured  Wt 114 lb (51.7 kg)   SpO2 97%   BMI 19.88 kg/m  Body mass index is 19.88 kg/m. Wt Readings from Last 3 Encounters:  05/18/19 114 lb (51.7 kg)  12/16/18 109 lb (49.4 kg)  12/15/18 109 lb (49.4 kg)   Constitutional: normal weight, in NAD Eyes: PERRLA, EOMI, no exophthalmos ENT: moist mucous membranes, no thyromegaly, no cervical lymphadenopathy Cardiovascular: RRR, No MRG Respiratory: CTA B Gastrointestinal: abdomen soft, NT, ND, BS+ Musculoskeletal: no deformities, strength intact in all 4 Skin: moist, warm, no rashes Neurological: no tremor with outstretched hands, DTR normal in all 4  ASSESSMENT: 1. DM2, non-insulin-dependent, uncontrolled, with complications - CAD, s/p NSTEMI 2019, s/p stent - PN   2. PN   3. HL  PLAN:  1. Patient with history of longstanding, uncontrolled, type 2 diabetes.  She had an NSTEMI in 08/2017 and had a stent placed.  We started Jardiance after this for its cardiovascular benefits.  She has no side effects from the medication.  She continues  exercising -At last visit, sugars are higher after stopping one of the 2 daily metformin doses in 09/2018.  We increase the dose back to 2 tablets a day.  At last visit, HbA1c was 7 6%, increased. -At this visit, sugars are improved and they are almost all at goal, with few hyperglycemic exceptions, after dietary indiscretions.  She continues on 2 tablets of Metformin daily and she actually gained 5 pounds since last visit.  We will continue the current regimen. - I suggested to:  Patient Instructions  Please continue: - Metformin ER 500 mg 2x a day with meals - Jardiance 10 mg before breakfast  Please return in 4 months with your sugar log.  - we checked her HbA1c: 6.4% (better) - advised to check sugars at different times of the day - 1x a day, rotating check times - advised for yearly eye exams >> she is UTD - return to clinic in 4 months      2. Diabetic PN -Stable, patient has no new complaints -She continues on -B complex daily -Neurontin 100 mg daily  3. HL -Reviewed latest lipid panel, which was excellent in 09/2018: Lab Results  Component Value Date   CHOL 129 09/21/2018   HDL 56 09/21/2018   LDLCALC 60 09/21/2018   TRIG 65 09/21/2018   CHOLHDL 2.3 09/21/2018  -Continues the statin without side effects  Philemon Kingdom, MD PhD [facility]

## 2019-06-07 ENCOUNTER — Other Ambulatory Visit: Payer: Self-pay | Admitting: Cardiovascular Disease

## 2019-06-14 ENCOUNTER — Other Ambulatory Visit: Payer: Self-pay

## 2019-06-14 ENCOUNTER — Ambulatory Visit: Payer: BC Managed Care – PPO | Admitting: Family Medicine

## 2019-06-14 ENCOUNTER — Encounter: Payer: Self-pay | Admitting: Family Medicine

## 2019-06-14 VITALS — BP 127/73 | HR 58 | Temp 97.9°F | Resp 17 | Ht 63.5 in | Wt 114.8 lb

## 2019-06-14 DIAGNOSIS — E2839 Other primary ovarian failure: Secondary | ICD-10-CM

## 2019-06-14 DIAGNOSIS — E1140 Type 2 diabetes mellitus with diabetic neuropathy, unspecified: Secondary | ICD-10-CM | POA: Diagnosis not present

## 2019-06-14 DIAGNOSIS — Z23 Encounter for immunization: Secondary | ICD-10-CM | POA: Diagnosis not present

## 2019-06-14 DIAGNOSIS — M8588 Other specified disorders of bone density and structure, other site: Secondary | ICD-10-CM | POA: Diagnosis not present

## 2019-06-14 NOTE — Patient Instructions (Addendum)
We recommend that you schedule a mammogram for breast cancer screening. Typically, you do not need a referral to do this. Please contact a local imaging center to schedule your mammogram.   [facility] ([place]) - [phone] or [phone]) [phone]     Osteopenia  Osteopenia is a loss of thickness (density) inside of the bones. Another name for osteopenia is low bone mass. Mild osteopenia is a normal part of aging. It is not a disease, and it does not cause symptoms. However, if you have osteopenia and continue to lose bone mass, you could develop a condition that causes the bones to become thin and break more easily (osteoporosis). You may also lose some height, have back pain, and have a stooped posture. Although osteopenia is not a disease, making changes to your lifestyle and diet can help to prevent osteopenia from developing into osteoporosis. What are the causes? Osteopenia is caused by loss of calcium in the bones.  Bones are constantly changing. Old bone cells are continually being replaced with new bone cells. This process builds new bone. The mineral calcium is needed to build new bone and maintain bone density. Bone density is usually highest around age 22. After that, most people's bodies cannot replace all the bone they have lost with new bone. What increases the risk? You are more likely to develop this condition if:  You are older than age 36.  You are a woman who went through menopause early.  You have a long illness that keeps you in bed.  You do not get enough exercise.  You lack certain nutrients (malnutrition).  You have an overactive thyroid gland (hyperthyroidism).  You smoke.  You drink a lot of alcohol.  You are taking medicines that weaken the bones, such as steroids. What are the signs or symptoms? This condition does not cause any symptoms. You may have a slightly higher risk for bone breaks (fractures), so getting fractures more easily  than normal may be an indication of osteopenia. How is this diagnosed? Your health care provider can diagnose this condition with a special type of X-ray exam that measures bone density (dual-energy X-ray absorptiometry, DEXA). This test can measure bone density in your hips, spine, and wrists. Osteopenia has no symptoms, so this condition is usually diagnosed after a routine bone density screening test is done for osteoporosis. This routine screening is usually done for:  Women who are age 20 or older.  Men who are age 60 or older. If you have risk factors for osteopenia, you may have the screening test at an earlier age. How is this treated? Making dietary and lifestyle changes can lower your risk for osteoporosis. If you have severe osteopenia that is close to becoming osteoporosis, your health care provider may prescribe medicines and dietary supplements such as calcium and vitamin D. These supplements help to rebuild bone density. Follow these instructions at home:   Take over-the-counter and prescription medicines only as told by your health care provider. These include vitamins and supplements.  Eat a diet that is high in calcium and vitamin D. ? Calcium is found in dairy products, beans, salmon, and leafy green vegetables like spinach and broccoli. ? Look for foods that have vitamin D and calcium added to them (fortified foods), such as orange juice, cereal, and bread.  Do 30 or more minutes of a weight-bearing exercise every day, such as walking, jogging, or playing a sport. These types of exercises strengthen the bones.  Take  precautions at home to lower your risk of falling, such as: ? Keeping rooms well-lit and free of clutter, such as cords. ? Installing safety rails on stairs. ? Using rubber mats in the bathroom or other areas that are often wet or slippery.  Do not use any products that contain nicotine or tobacco, such as cigarettes and e-cigarettes. If you need help  quitting, ask your health care provider.  Avoid alcohol or limit alcohol intake to no more than 1 drink a day for nonpregnant women and 2 drinks a day for men. One drink equals 12 oz of beer, 5 oz of wine, or 1 oz of hard liquor.  Keep all follow-up visits as told by your health care provider. This is important. Contact a health care provider if:  You have not had a bone density screening for osteoporosis and you are: ? A woman, age 39 or older. ? A man, age 98 or older.  You are a postmenopausal woman who has not had a bone density screening for osteoporosis.  You are older than age 50 and you want to know if you should have bone density screening for osteoporosis. Summary  Osteopenia is a loss of thickness (density) inside of the bones. Another name for osteopenia is low bone mass.  Osteopenia is not a disease, but it may increase your risk for a condition that causes the bones to become thin and break more easily (osteoporosis).  You may be at risk for osteopenia if you are older than age 56 or if you are a woman who went through early menopause.  Osteopenia does not cause any symptoms, but it can be diagnosed with a bone density screening test.  Dietary and lifestyle changes are the first treatment for osteopenia. These may lower your risk for osteoporosis. This information is not intended to replace advice given to you by your health care provider. Make sure you discuss any questions you have with your health care provider. Document Released: 04/08/2017 Document Revised: 06/12/2017 Document Reviewed: 04/08/2017 Elsevier Patient Education  2020 [employer].

## 2019-06-14 NOTE — Progress Notes (Signed)
Established Patient Office Visit  Subjective:  Patient ID: Carmen Anderson, female    DOB: 05/14/1961  Age: 58 y.o. MRN: [id number]  CC:  Chief Complaint  Patient presents with  . diabetes and hypertension    6 month f/u    HPI Carmen LevyCarla L Pangle presents for   Diabetes Mellitus: Patient presents for follow up of diabetes. Symptoms: none. Symptoms have stabilized. Patient denies foot ulcerations, hypoglycemia , increase appetite, nausea and paresthesia of the feet.  Evaluation to date has been included: hemoglobin A1C.  Home sugars: BGs consistently in an acceptable range. Lab Results  Component Value Date   HGBA1C 6.4 (A) 05/18/2019   Wt Readings from Last 3 Encounters:  06/14/19 114 lb 12.8 oz (52.1 kg)  05/18/19 114 lb (51.7 kg)  12/16/18 109 lb (49.4 kg)   Hypothyroidism: Patient presents for evaluation of thyroid function. Symptoms consist of denies fatigue, weight changes, heat/cold intolerance, bowel/skin changes or CVS symptoms. The problem has been stable.  Previous thyroid studies include TSH. The hypothyroidism is due to hypothyroidism.  Lab Results  Component Value Date   TSH 1.770 09/21/2018   Osteopenia She is exercising and taking calcium supplement and keeping a balanced diet She states that she is no longer smoking She is s/p salpingo-oophorectomy for >10 years She denies fractures    Past Medical History:  Diagnosis Date  . DM type 2 (diabetes mellitus, type 2) (HCC)   . History of hiatal hernia   . Hyperlipidemia   . Hypertension   . Hypothyroidism     Past Surgical History:  Procedure Laterality Date  . ABDOMINAL HYSTERECTOMY  1995  . CARDIAC CATHETERIZATION    . CORONARY STENT INTERVENTION N/A 09/07/2017   Procedure: CORONARY STENT INTERVENTION;  Surgeon: Runell GessBerry, Jonathan J, MD;  Location: [facility];  Service: Cardiovascular;  Laterality: N/A;  . LEFT HEART CATH AND CORONARY ANGIOGRAPHY N/A 09/07/2017   Procedure: LEFT HEART CATH AND  CORONARY ANGIOGRAPHY;  Surgeon: Runell GessBerry, Jonathan J, MD;  Location: [facility];  Service: Cardiovascular;  Laterality: N/A;  . STAPEDECTOMY Right ~ 2002    Family History  Problem Relation Age of Onset  . Diabetes Mother   . Heart disease Mother   . Stroke Father        2011  . Hyperlipidemia Father   . Diabetes Sister   . Heart disease Sister   . Heart disease Brother        congenital    Social History   Socioeconomic History  . Marital status: Married    Spouse name: Not on file  . Number of children: Not on file  . Years of education: 5612  . Highest education level: Not on file  Occupational History  . Occupation: property asst. Event organisermanager    Employer:  [employer]  Social Needs  . Financial resource strain: Not on file  . Food insecurity    Worry: Not on file    Inability: Not on file  . Transportation needs    Medical: Not on file    Non-medical: Not on file  Tobacco Use  . Smoking status: Former Smoker    Packs/day: 0.50    Years: 27.00    Pack years: 13.50    Types: Cigarettes    Quit date: 09/05/2017    Years since quitting: 1.7  . Smokeless tobacco: Never Used  Substance and Sexual Activity  . Alcohol use: No  . Drug use: No  .  Sexual activity: Yes    Birth control/protection: None  Lifestyle  . Physical activity    Days per week: Not on file    Minutes per session: Not on file  . Stress: Not on file  Relationships  . Social Musician on phone: Not on file    Gets together: Not on file    Attends religious service: Not on file    Active member of club or organization: Not on file    Attends meetings of clubs or organizations: Not on file    Relationship status: Not on file  . Intimate partner violence    Fear of current or ex partner: Not on file    Emotionally abused: Not on file    Physically abused: Not on file    Forced sexual activity: Not on file  Other Topics Concern  . Not on file  Social History  Narrative   Exercise: walking, 4 times/week for 30 minutes.    Outpatient Medications Prior to Visit  Medication Sig Dispense Refill  . aspirin EC 81 MG EC tablet Take 1 tablet (81 mg total) by mouth daily.    [name] atorvastatin (LIPITOR) 80 MG tablet TAKE 1 TABLET (80 MG TOTAL) BY MOUTH 2 (TWO) TIMES DAILY. 90 tablet 3  . clopidogrel (PLAVIX) 75 MG tablet Take 1 tablet (75 mg total) by mouth daily. 90 tablet 3  . empagliflozin (JARDIANCE) 10 MG TABS tablet Take 10 mg by mouth daily. 90 mg 3  . fluticasone (FLONASE) 50 MCG/ACT nasal spray Place 2 sprays into both nostrils daily. 16 g 6  . gabapentin (NEURONTIN) 100 MG capsule Take 1 capsule (100 mg total) by mouth at bedtime. 90 capsule 3  . levothyroxine (SYNTHROID) 50 MCG tablet Take 1 tablet (50 mcg total) by mouth daily. 90 tablet 3  . metFORMIN (GLUCOPHAGE-XR) 500 MG 24 hr tablet TAKE 1 TABLET BY MOUTH TWICE DAILY WITH MEALS AS DIRECTED 180 tablet 3  . nitroGLYCERIN (NITROSTAT) 0.4 MG SL tablet Place 1 tablet (0.4 mg total) under the tongue every 5 (five) minutes x 3 doses as needed for chest pain. 25 tablet 3  . ticagrelor (BRILINTA) 90 MG TABS tablet Take 1 tablet (90 mg total) by mouth 2 (two) times daily. 180 tablet 2   No facility-administered medications prior to visit.     Allergies  Allergen Reactions  . Aspirin Other (See Comments)    Burns stomach  . Codeine Rash    ROS Review of Systems Review of Systems  Constitutional: Negative for activity change, appetite change, chills and fever.  HENT: Negative for congestion, nosebleeds, trouble swallowing and voice change.   Respiratory: Negative for cough, shortness of breath and wheezing.   Gastrointestinal: Negative for diarrhea, nausea and vomiting.  Genitourinary: Negative for difficulty urinating, dysuria, flank pain and hematuria.  Musculoskeletal: Negative for back pain, joint swelling and neck pain.  Neurological: Negative for dizziness, speech difficulty,  light-headedness and numbness.  See HPI. All other review of systems negative.     Objective:    Physical Exam  BP 127/73 (BP Location: Right Arm, Patient Position: Sitting, Cuff Size: Normal)   Pulse (!) 58   Temp 97.9 F (36.6 C) (Oral)   Resp 17   Ht 5' 3.5" (1.613 m)   Wt 114 lb 12.8 oz (52.1 kg)   SpO2 100%   BMI 20.02 kg/m  Wt Readings from Last 3 Encounters:  06/14/19 114 lb 12.8 oz (52.1 kg)  05/18/19  114 lb (51.7 kg)  12/16/18 109 lb (49.4 kg)    Diabetic Foot Exam - Simple   Simple Foot Form Diabetic Foot exam was performed with the following findings: Yes 06/14/2019  8:59 AM  Visual Inspection See comments: Yes Sensation Testing Intact to touch and monofilament testing bilaterally: Yes Pulse Check Posterior Tibialis and Dorsalis pulse intact bilaterally: Yes Comments Thick toenails     Physical Exam  Constitutional: Oriented to person, place, and time. Appears well-developed and well-nourished.  HENT:  Head: Normocephalic and atraumatic.  Eyes: Conjunctivae and EOM are normal.  Cardiovascular: Normal rate, regular rhythm, normal heart sounds and intact distal pulses.  No murmur heard. Pulmonary/Chest: Effort normal and breath sounds normal. No stridor. No respiratory distress. Has no wheezes.  Neurological: Is alert and oriented to person, place, and time.  Skin: Skin is warm. Capillary refill takes less than 2 seconds.  Psychiatric: Has a normal mood and affect. Behavior is normal. Judgment and thought content normal.    Health Maintenance Due  Topic Date Due  . OPHTHALMOLOGY EXAM  07/17/2018  . MAMMOGRAM  07/29/2018  . FOOT EXAM  01/12/2019  . INFLUENZA VACCINE  02/12/2019    There are no preventive care reminders to display for this patient.  Lab Results  Component Value Date   TSH 1.770 09/21/2018   Lab Results  Component Value Date   WBC 12.3 (H) 09/21/2018   HGB 13.7 09/21/2018   HCT 41.1 09/21/2018   MCV 93 09/21/2018   PLT 315  09/21/2018   Lab Results  Component Value Date   NA 138 09/21/2018   K 4.6 09/21/2018   CO2 16 (L) 09/21/2018   GLUCOSE 130 (H) 09/21/2018   BUN 13 09/21/2018   CREATININE 0.85 09/21/2018   BILITOT 0.4 09/21/2018   ALKPHOS 63 09/21/2018   AST 20 09/21/2018   ALT 25 09/21/2018   PROT 6.7 09/21/2018   ALBUMIN 4.9 09/21/2018   CALCIUM 9.7 09/21/2018   ANIONGAP 7 09/08/2017   Lab Results  Component Value Date   CHOL 129 09/21/2018   Lab Results  Component Value Date   HDL 56 09/21/2018   Lab Results  Component Value Date   LDLCALC 60 09/21/2018   Lab Results  Component Value Date   TRIG 65 09/21/2018   Lab Results  Component Value Date   CHOLHDL 2.3 09/21/2018   Lab Results  Component Value Date   HGBA1C 6.4 (A) 05/18/2019      Assessment & Plan:   Problem List Items Addressed This Visit      Endocrine   Type 2 diabetes mellitus with diabetic neuropathy, without long-term current use of insulin ([place]) - Primary well controlled hemoglobin a1c is at goal Continue exercise Lipids monitored and renal function in range On metformin and jardiance Reviewed diabetic foot care Emphasized importance of eye and dental exam      Relevant Orders   HM Diabetes Foot Exam (Completed)    Other Visit Diagnoses    Need for prophylactic vaccination and inoculation against influenza       Osteopenia of lumbar spine    - discussed bone density and how to prevent osteoporosis   Relevant Orders   DG Bone Density   Estrogen deficiency       Relevant Orders   DG Bone Density     CAD - pt on plavix,   No orders of the defined types were placed in this encounter.   Follow-up: Return in about  4 months (around 10/13/2019) for PHYSICAL.    Doristine Bosworth, MD

## 2019-08-08 ENCOUNTER — Ambulatory Visit: Payer: BC Managed Care – PPO | Admitting: Cardiovascular Disease

## 2019-08-08 ENCOUNTER — Other Ambulatory Visit: Payer: Self-pay

## 2019-08-08 ENCOUNTER — Encounter: Payer: Self-pay | Admitting: Cardiovascular Disease

## 2019-08-08 VITALS — BP 126/64 | HR 63 | Ht 63.5 in | Wt 118.2 lb

## 2019-08-08 DIAGNOSIS — E1159 Type 2 diabetes mellitus with other circulatory complications: Secondary | ICD-10-CM

## 2019-08-08 DIAGNOSIS — E785 Hyperlipidemia, unspecified: Secondary | ICD-10-CM

## 2019-08-08 DIAGNOSIS — I2510 Atherosclerotic heart disease of native coronary artery without angina pectoris: Secondary | ICD-10-CM

## 2019-08-08 DIAGNOSIS — I214 Non-ST elevation (NSTEMI) myocardial infarction: Secondary | ICD-10-CM | POA: Diagnosis not present

## 2019-08-08 DIAGNOSIS — E119 Type 2 diabetes mellitus without complications: Secondary | ICD-10-CM

## 2019-08-08 DIAGNOSIS — E039 Hypothyroidism, unspecified: Secondary | ICD-10-CM

## 2019-08-08 MED ORDER — NITROGLYCERIN 0.4 MG SL SUBL: 0.4000 mg | SUBLINGUAL_TABLET | SUBLINGUAL | 3 refills | Status: AC | PRN

## 2019-08-08 NOTE — Patient Instructions (Signed)
Medication Instructions:  Continue current medications  *If you need a refill on your cardiac medications before your next appointment, please call your pharmacy*  Lab Work: None Ordered  Testing/Procedures: None Ordered  Follow-Up: At [facility], you and your health needs are our priority.  As part of our continuing mission to provide you with exceptional heart care, we have created designated Provider Care Teams.  These Care Teams include your primary Cardiologist (physician) and Advanced Practice Providers (APPs -  Physician Assistants and Nurse Practitioners) who all work together to provide you with the care you need, when you need it.  Your next appointment:   1 year(s)  The format for your next appointment:   In Person  Provider:   Raguel Kosloski Kelly, MD    

## 2019-08-08 NOTE — Progress Notes (Signed)
Cardiology Office Note    Date:  08/08/2019   ID:  Carmen Anderson, DOB Nov 14, 1960, MRN [id number]  PCP:  Forrest Moron, MD  Cardiologist:  Shelva Majestic, MD   No chief complaint on file.   History of Present Illness:  Carmen Anderson is a 59 y.o. female who has a history of hyperlipidemia, diabetes mellitus, hypothyroidism who suffered a non-ST segment elevation MI.  She was last seen by me in the office in December 2019.  She presents for 44-monthfollow-up evaluation  Carmen Anderson hospitalized on September 04, 2017 after developing chest pain with left arm radiation associated with shortness of breath.  I had seen her for cardiology consultation during her evaluation.  She had smoked for approximately 10 years, quit for 5 years, and then had been smoking for another 10 years.  She ultimately ruled in for non-ST segment MI.  A nuclear study showed a medium defect of moderate severity in the mid anteroseptal apical anterior apical septal location.  An echo Doppler study showed an EF of 60 to 65%.  She underwent cardiac catheterization of fiber 25 2019 which showed a 90% proximal to mid LAD stenosis successfully treated with DES stenting by Dr. BGwenlyn Foundand a 2.25 x 16 mm Synergy stent was inserted.  Subsequently, she stabilized.  Initial LDL cholesterol was notable for 191.  She was on atorvastatin 80 mg daily for hyperlipidemia and 3 weeks ago repeat laboratory showed an LDL cholesterol improved to 52.  She is diabetic on Jardiance 10 mg in addition to metformin.  She has been on aspirin and Brilinta for dual antiplatelet therapy.  She is on lisinopril 5 mg daily.  She has hypothyroidism on levothyroxine at 50 mcg.  She is on Jardiance in addition to metformin for diabetes mellitus.  When I saw her in May 2019 she was doing well and was about to start cardiac rehab.   I last saw her in December 2019 at which time she denied any recurrent episodes of chest pain.  There was resolution of her prior  exertional dyspnea.  She was working at [employer]  She denied palpitations .  She had mild orthostatic hypotension and I reduced her lisinopril down to 2.5 mg.  She was last evaluated by AFabian Sharp PA in a telemedicine visit in June 2020.  She was not having anginal symptoms.  Her Brilinta was discontinued due to cost and she was switched to clopidogrel.  She was on Jardiance and Metformin for her diabetes mellitus followed by Dr. GCruzita Lederer  She continued to be on atorvastatin for hyperlipidemia.  Since her last evaluation, she has continued to do well.  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  She specifically denies any chest pain or shortness of breath.  She is now off lisinopril completely and admits to a stable blood pressure.  She denies any presyncope or syncope.  She is unaware of palpitations.  She is on aspirin and Plavix for her CAD.  She continues to be on Jardiance and Metformin for her diabetes mellitus.  She has hypothyroidism on levothyroxine.  Her primary physician will be rechecking laboratory in several months.  She presents for evaluation.   Past Medical History:  Diagnosis Date  . DM type 2 (diabetes mellitus, type 2) ([place]   . History of hiatal hernia   . Hyperlipidemia   . Hypertension   . Hypothyroidism     Past Surgical  History:  Procedure Laterality Date  . ABDOMINAL HYSTERECTOMY  1995  . CARDIAC CATHETERIZATION    . CORONARY STENT INTERVENTION N/A 09/07/2017   Procedure: CORONARY STENT INTERVENTION;  Surgeon: Lorretta Harp, MD;  Location: [facility];  Service: Cardiovascular;  Laterality: N/A;  . LEFT HEART CATH AND CORONARY ANGIOGRAPHY N/A 09/07/2017   Procedure: LEFT HEART CATH AND CORONARY ANGIOGRAPHY;  Surgeon: Lorretta Harp, MD;  Location: [facility];  Service: Cardiovascular;  Laterality: N/A;  . STAPEDECTOMY Right ~ 2002    Current Medications: Outpatient Medications Prior to Visit   Medication Sig Dispense Refill  . aspirin EC 81 MG EC tablet Take 1 tablet (81 mg total) by mouth daily.    [name] atorvastatin (LIPITOR) 80 MG tablet Take 80 mg by mouth daily.    . clopidogrel (PLAVIX) 75 MG tablet Take 1 tablet (75 mg total) by mouth daily. 90 tablet 3  . empagliflozin (JARDIANCE) 10 MG TABS tablet Take 10 mg by mouth daily. 90 mg 3  . fluticasone (FLONASE) 50 MCG/ACT nasal spray Place 2 sprays into both nostrils daily. 16 g 6  . gabapentin (NEURONTIN) 100 MG capsule Take 1 capsule (100 mg total) by mouth at bedtime. 90 capsule 3  . levothyroxine (SYNTHROID) 50 MCG tablet Take 1 tablet (50 mcg total) by mouth daily. 90 tablet 3  . metFORMIN (GLUCOPHAGE-XR) 500 MG 24 hr tablet TAKE 1 TABLET BY MOUTH TWICE DAILY WITH MEALS AS DIRECTED 180 tablet 3  . nitroGLYCERIN (NITROSTAT) 0.4 MG SL tablet Place 1 tablet (0.4 mg total) under the tongue every 5 (five) minutes x 3 doses as needed for chest pain. 25 tablet 3  . ticagrelor (BRILINTA) 90 MG TABS tablet Take 1 tablet (90 mg total) by mouth 2 (two) times daily. 180 tablet 2  . atorvastatin (LIPITOR) 80 MG tablet TAKE 1 TABLET (80 MG TOTAL) BY MOUTH 2 (TWO) TIMES DAILY. 90 tablet 3   No facility-administered medications prior to visit.     Allergies:   Aspirin and Codeine   Social History   Socioeconomic History  . Marital status: Married    Spouse name: Not on file  . Number of children: Not on file  . Years of education: 28  . Highest education level: Not on file  Occupational History  . 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 Smoking status: Former Smoker    Packs/day: 0.50    Years: 27.00    Pack years: 13.50    Types: Cigarettes    Quit date: 09/05/2017    Years since quitting: 1.9  . Smokeless tobacco: Never Used  Substance and Sexual Activity  . Alcohol use: No  . Drug use: No  . Sexual activity: Yes    Birth control/protection: None  Other Topics Concern   . Not on file  Social History Narrative   Exercise: walking, 4 times/week for 30 minutes.   Social Determinants of Health   Financial Resource Strain:   . Difficulty of Paying Living Expenses: Not on file  Food Insecurity:   . Worried About Charity fundraiser in the Last Year: Not on file  . Ran Out of Food in the Last Year: Not on file  Transportation Needs:   . Lack of Transportation (Medical): Not on file  . Lack of Transportation (Non-Medical): Not on file  Physical Activity:   . Days of Exercise per Week: Not on file  .  Minutes of Exercise per Session: Not on file  Stress:   . Feeling of Stress : Not on file  Social Connections:   . Frequency of Communication with Friends and Family: Not on file  . Frequency of Social Gatherings with Friends and Family: Not on file  . Attends Religious Services: Not on file  . Active Member of Clubs or Organizations: Not on file  . Attends Archivist Meetings: Not on file  . Marital Status: Not on file    Quit tobacco on September 04, 2017  Family History:  The patient's family history includes Diabetes in her mother and sister; Heart disease in her brother, mother, and sister; Hyperlipidemia in her father; Stroke in her father.   ROS General: Negative; No fevers, chills, or night sweats;  HEENT: Negative; No changes in vision or hearing, sinus congestion, difficulty swallowing Pulmonary: Negative; No cough, wheezing, shortness of breath, hemoptysis Cardiovascular: See HPI GI: Negative; No nausea, vomiting, diarrhea, or abdominal pain GU: Negative; No dysuria, hematuria, or difficulty voiding Musculoskeletal: Negative; no myalgias, joint pain, or weakness Hematologic/Oncology: Negative; no easy bruising, bleeding Endocrine: Negative; no heat/cold intolerance; no diabetes Neuro: Negative; no changes in balance, headaches Skin: Negative; No rashes or skin lesions Psychiatric: Negative; No behavioral problems,  depression Sleep: Negative; No snoring, daytime sleepiness, hypersomnolence, bruxism, restless legs, hypnogognic hallucinations, no cataplexy Other comprehensive 14 point system review is negative.   PHYSICAL EXAM:   VS:  BP 126/64   Pulse 63   Ht 5' 3.5" (1.613 m)   Wt 118 lb 3.2 oz (53.6 kg)   BMI 20.61 kg/m     Repeat blood pressure by me was 124/64 supine 126/64 standing.  There was no significant increase in pulse rate.  Wt Readings from Last 3 Encounters:  08/08/19 118 lb 3.2 oz (53.6 kg)  06/14/19 114 lb 12.8 oz (52.1 kg)  05/18/19 114 lb (51.7 kg)    General: Alert, oriented, no distress.  Skin: normal turgor, no rashes, warm and dry HEENT: Normocephalic, atraumatic. Pupils equal round and reactive to light; sclera anicteric; extraocular muscles intact; Nose without nasal septal hypertrophy Mouth/Parynx benign; Mallinpatti scale 2 Neck: No JVD, no carotid bruits; normal carotid upstroke Lungs: clear to ausculatation and percussion; no wheezing or rales Chest wall: without tenderness to palpitation Heart: PMI not displaced, RRR, s1 s2 normal, 1/6 systolic murmur, no diastolic murmur, no rubs, gallops, thrills, or heaves Abdomen: soft, nontender; no hepatosplenomehaly, BS+; abdominal aorta nontender and not dilated by palpation. Back: no CVA tenderness Pulses 2+ Musculoskeletal: full range of motion, normal strength, no joint deformities Extremities: no clubbing cyanosis or edema, Homan's sign negative  Neurologic: grossly nonfocal; Cranial nerves grossly wnl Psychologic: Normal mood and affect   Studies/Labs Reviewed:   EKG:  EKG is ordered today.  ECG (independently read by me): Sinus rhythm at 63 bpm.  Normal intervals.  No ectopy.  No significant ST-T changes    December 2019 ECG (independently read by me): Sinus rhythm at 60 bpm.  No ectopy.  Normal intervals.  Nov 16, 2017 ECG (independently read by me): Sinus bradycardia 55 bpm.  No ST segment changes.  No  ectopy.  Recent Labs: BMP Latest Ref Rng & Units 09/21/2018 12/14/2017 09/08/2017  Glucose 65 - 99 mg/dL 130(H) 133(H) 139(H)  BUN 6 - 24 mg/dL '13 20 12  '$ Creatinine 0.57 - 1.00 mg/dL 0.85 1.07(H) 0.66  BUN/Creat Ratio 9 - '23 15 19 '$ -  Sodium 134 - 144 mmol/L 138  143 139  Potassium 3.5 - 5.2 mmol/L 4.6 4.5 4.4  Chloride 96 - 106 mmol/L 101 109 110  CO2 20 - 29 mmol/L 16(L) 20 22  Calcium 8.7 - 10.2 mg/dL 9.7 9.8 9.2     Hepatic Function Latest Ref Rng & Units 09/21/2018 10/20/2017 01/02/2017  Total Protein 6.0 - 8.5 g/dL 6.7 7.1 7.1  Albumin 3.8 - 4.9 g/dL 4.9 5.0 4.6  AST 0 - 40 IU/L '20 19 15  '$ ALT 0 - 32 IU/L '25 26 12  '$ Alk Phosphatase 39 - 117 IU/L 63 69 58  Total Bilirubin 0.0 - 1.2 mg/dL 0.4 0.6 0.3  Bilirubin, Direct 0.00 - 0.40 mg/dL - 0.14 -    CBC Latest Ref Rng & Units 09/21/2018 09/08/2017 09/07/2017  WBC 3.4 - 10.8 x10E3/uL 12.3(H) 11.9(H) 11.8(H)  Hemoglobin 11.1 - 15.9 g/dL 13.7 13.6 13.5  Hematocrit 34.0 - 46.6 % 41.1 41.2 41.8  Platelets 150 - 450 x10E3/uL 315 264 302   Lab Results  Component Value Date   MCV 93 09/21/2018   MCV 92.2 09/08/2017   MCV 92.9 09/07/2017   Lab Results  Component Value Date   TSH 1.770 09/21/2018   Lab Results  Component Value Date   HGBA1C 6.4 (A) 05/18/2019     BNP No results found for: BNP  ProBNP No results found for: PROBNP   Lipid Panel     Component Value Date/Time   CHOL 129 09/21/2018 0958   TRIG 65 09/21/2018 0958   HDL 56 09/21/2018 0958   CHOLHDL 2.3 09/21/2018 0958   CHOLHDL 5.6 09/05/2017 0259   VLDL 22 09/05/2017 0259   LDLCALC 60 09/21/2018 0958     RADIOLOGY: No results found.   Additional studies/ records that were reviewed today include:  I reviewed her [facility], cardiac catheterization report, echo Doppler study, subsequent office visits and laboratory.   ASSESSMENT:    1. Non-ST elevation (NSTEMI) myocardial infarction [facility]): February 2019, status post DES stent to LAD   2.  Coronary artery disease involving native coronary artery of native heart without angina pectoris   3. Hyperlipidemia with target LDL less than 70   4. Diabetes mellitus with cardiac complication ([place])   5. Controlled type 2 diabetes mellitus without complication, without long-term current use of insulin ([place])   6. Hypothyroidism, unspecified type     PLAN:  Carmen Anderson  is a 59 year old female who suffered a non-ST segment elevation myocardial infarction in February 2019 and ultimately was found to have high-grade proximal to mid 90% LAD stenosis which was successfully stented with a synergy DES stent.  She has a long-standing history of prior diabetes mellitus, hypertension, hyperlipidemia, and tobacco use.  Fortunately she quit smoking on her day of admission.  Since her initial intervention, she has been without recurrent anginal symptomatology.  Remotely, she had developed mild orthostatic hypotension on lisinopril and now is completely off therapy.  Her blood pressure today is stable.  Her ECG today shows normal sinus rhythm at 63 without ectopy.  She is not on any beta-blocker therapy.  She is now on aspirin and Plavix for dual antiplatelet therapy and is tolerating this well without bleeding.  At her initial MI presentation she had marked hyperlipidemia with an LDL cholesterol of 192.  This has been aggressively treated with atorvastatin with her last LDL cholesterol in March 2020 at 3.  She is followed by endocrinology for diabetes mellitus and hypothyroidism.  She is on Jardiance in addition  to metformin for diabetes mellitus and levothyroxine 50 mcg for her hypothyroidism.  She has peripheral neuropathy and continues to be on gabapentin.  We discussed the importance of continued exercise.  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  As long as she remains stable I will see her in 1 year for reevaluation  or sooner problems arise.   Time spent: 25 minutes Medication Adjustments/Labs and Tests Ordered: Current medicines are reviewed at length with the patient today.  Concerns regarding medicines are outlined above.  Medication changes, Labs and Tests ordered today are listed in the Patient Instructions below. Patient Instructions  Medication Instructions:  Continue current medications  *If you need a refill on your cardiac medications before your next appointment, please call your pharmacy*  Lab Work: None Ordered  Testing/Procedures: None Ordered  Follow-Up: At [facility], you and your health needs are our priority.  As part of our continuing mission to provide you with exceptional heart care, we have created designated Provider Care Teams.  These Care Teams include your primary Cardiologist (physician) and Advanced Practice Providers (APPs -  Physician Assistants and Nurse Practitioners) who all work together to provide you with the care you need, when you need it.  Your next appointment:   1 year(s)  The format for your next appointment:   In Person  Provider:   Shelva Majestic, MD       Signed, Shelva Majestic, MD  08/08/2019 1:55 PM    [facility] [address], [place], [place], [place]  [address] Phone: [phone]

## 2019-08-23 ENCOUNTER — Other Ambulatory Visit: Payer: Self-pay | Admitting: Family Medicine

## 2019-08-23 DIAGNOSIS — Z1231 Encounter for screening mammogram for malignant neoplasm of breast: Secondary | ICD-10-CM

## 2019-09-12 ENCOUNTER — Other Ambulatory Visit: Payer: BC Managed Care – PPO

## 2019-09-22 ENCOUNTER — Encounter: Payer: Self-pay | Admitting: Internal Medicine

## 2019-09-22 ENCOUNTER — Other Ambulatory Visit: Payer: Self-pay

## 2019-09-22 ENCOUNTER — Ambulatory Visit (INDEPENDENT_AMBULATORY_CARE_PROVIDER_SITE_OTHER): Payer: BC Managed Care – PPO | Admitting: Internal Medicine

## 2019-09-22 VITALS — BP 136/68 | HR 62 | Ht 63.5 in | Wt 119.0 lb

## 2019-09-22 DIAGNOSIS — E1140 Type 2 diabetes mellitus with diabetic neuropathy, unspecified: Secondary | ICD-10-CM | POA: Diagnosis not present

## 2019-09-22 DIAGNOSIS — E1165 Type 2 diabetes mellitus with hyperglycemia: Secondary | ICD-10-CM

## 2019-09-22 DIAGNOSIS — E1159 Type 2 diabetes mellitus with other circulatory complications: Secondary | ICD-10-CM | POA: Diagnosis not present

## 2019-09-22 DIAGNOSIS — E785 Hyperlipidemia, unspecified: Secondary | ICD-10-CM | POA: Diagnosis not present

## 2019-09-22 DIAGNOSIS — E1142 Type 2 diabetes mellitus with diabetic polyneuropathy: Secondary | ICD-10-CM

## 2019-09-22 LAB — POCT GLYCOSYLATED HEMOGLOBIN (HGB A1C): Hemoglobin A1C: 7.4 % — AB (ref 4.0–5.6)

## 2019-09-22 MED ORDER — JARDIANCE 10 MG PO TABS: 10.0000 mg | ORAL_TABLET | Freq: Every day | ORAL | 3 refills | Status: DC

## 2019-09-22 MED ORDER — METFORMIN HCL ER 500 MG PO TB24: ORAL_TABLET | ORAL | 3 refills | Status: DC

## 2019-09-22 NOTE — Addendum Note (Signed)
Addended by: Darliss Ridgel I on: 09/22/2019 10:33 AM   Modules accepted: Orders

## 2019-09-22 NOTE — Progress Notes (Signed)
Patient ID: Carmen Anderson, female   DOB: 1961-03-29, 59 y.o.   MRN: [id number]   This visit occurred during the SARS-CoV-2 public health emergency.  Safety protocols were in place, including screening questions prior to the visit, additional usage of staff PPE, and extensive cleaning of exam room while observing appropriate contact time as indicated for disinfecting solutions.   HPI: Carmen Anderson is a 59 y.o.-year-old female, initially referred by her PCP, Clarene Reamer, NP, returning for follow-up for DM2, dx in 2001-2002, non-insulin-dependent, now more controlled, with complications (CAD - s/p NSTEMI 2019, s/p stent; PN). Last visit for months ago.  Reviewed HbA1c levels: Lab Results  Component Value Date   HGBA1C 6.4 (A) 05/18/2019   HGBA1C 7.6 (A) 12/15/2018   HGBA1C 6.7 (A) 08/16/2018   HGBA1C 6.9 (A) 04/13/2018   HGBA1C 6.6 (A) 12/14/2017   HGBA1C 6.9 (H) 09/06/2017   HGBA1C 7 08/14/2017   HGBA1C 6.8 04/13/2017   HGBA1C 6.7 11/25/2016   HGBA1C 7.0 06/03/2016   HGBA1C 7.0 03/04/2016   HGBA1C 7.0 12/03/2015   HGBA1C 7.9 (H) 09/05/2015   HGBA1C 7.7 (H) 05/09/2015   HGBA1C 7.0 10/27/2014   HGBA1C 6.7 04/28/2014   HGBA1C 7.0 12/14/2013   HGBA1C 6.8 08/03/2013   HGBA1C 6.7 01/26/2013   HGBA1C 6.6 10/20/2012   Pt is on a regimen of: - Metformin ER 500 mg once a day (decreased by PCP in 09/2018) >> increase back to twice a day 12/2018 - Januvia 100 mg daily in am >> Jardiance 10 mg before breakfast  Pt checks her sugars twice a day per her excellent log: - am:  77, 80-128, 132 >> 79-120, 140 >> 95-120, 127 >> 79-121, 127 - 2h after b'fast: 139-175 >> 110-179 >> 124-178 >> 130-167, 177 - before lunch:  86-119 >> 75-112 >> 84-111 >> 79-119 - 2h after lunch:140-181, 199 >> 140-169, 176 >> 150-169 - before dinner: 87-137, 149 >> 88-124, 160 >> 92-119, 127 - 2h after dinner:161-177 >> 135-177, 191 >> 146, 154-177 - bedtime:100-140, 160 >> 97-125, 159 >> 89-123 - nighttime: n/c  >> n/c Lowest sugar was 38 ... >> 89 >> 77 >> 88 >> 79; she has hypoglycemia awareness in the 50s. Highestin the 50s 172 >> 168 >> 240 >> 191 >> 177.  Glucometer: FreeStyle >> [facility]  Pt's meals are: - snack:  1/2 yoghurt, PB + bread - Breakfast: wheat waffle + sugar free syrup or wheat muffin w/ 1 egg  - Lunch: salads or [place] sandwich on wheat bread - Dinner: soups, salad, meat 2x a weekly (chicken, salmon) + veggies - Snacks: pineapple, celery  She continues to exercise 4-5 times a week on the treadmill.  -No CKD, last BUN/creatinine:  Lab Results  Component Value Date   BUN 13 09/21/2018   CREATININE 0.85 09/21/2018  On lisinopril 2.5. -+ HL; last set of lipids: Lab Results  Component Value Date   CHOL 129 09/21/2018   HDL 56 09/21/2018   LDLCALC 60 09/21/2018   TRIG 65 09/21/2018   CHOLHDL 2.3 09/21/2018  On Lipitor, fish oil. - last eye exam was in 02/2019: No DR; [facility].  -+ Numbness and tingling in her feet.  On Neurontin low-dose and B complex  She had a NSTEMI in 08/2017.  At that time, she had a catheterization and had 90% blockage on LAD.  She had a stent placed.   She also has HTN and controlled hypothyroidism.  Latest TSH was normal: Lab  Results  Component Value Date   TSH 1.770 09/21/2018   ROS: Constitutional: no weight gain/no weight loss, no fatigue, no subjective hyperthermia, no subjective hypothermia Eyes: no blurry vision, no xerophthalmia ENT: no sore throat, no nodules palpated in neck, no dysphagia, no odynophagia, no hoarseness Cardiovascular: no CP/no SOB/no palpitations/no leg swelling Respiratory: no cough/no SOB/no wheezing Gastrointestinal: no N/no V/no D/no C/no acid reflux Musculoskeletal: no muscle aches/no joint aches Skin: no rashes, no hair loss Neurological: no tremors/+ numbness/+ tingling/no dizziness  I reviewed pt's medications, allergies, PMH, social hx, family hx, and changes were documented in the history of  present illness. Otherwise, unchanged from my initial visit note.  Past Medical History:  Diagnosis Date  . DM type 2 (diabetes mellitus, type 2) (HCC)   . History of hiatal hernia   . Hyperlipidemia   . Hypertension   . Hypothyroidism    Past Surgical History:  Procedure Laterality Date  . ABDOMINAL HYSTERECTOMY  1995  . CARDIAC CATHETERIZATION    . CORONARY STENT INTERVENTION N/A 09/07/2017   Procedure: CORONARY STENT INTERVENTION;  Surgeon: Runell Gess, MD;  Location: [facility];  Service: Cardiovascular;  Laterality: N/A;  . LEFT HEART CATH AND CORONARY ANGIOGRAPHY N/A 09/07/2017   Procedure: LEFT HEART CATH AND CORONARY ANGIOGRAPHY;  Surgeon: Runell Gess, MD;  Location: [facility];  Service: Cardiovascular;  Laterality: N/A;  . STAPEDECTOMY Right ~ 2002   Social History   Social History  . Marital Status: Married    Spouse Name: N/A  . Number of Children: 0  . Years of Education: 12   Occupational History  . property asst. Production designer, theatre/television/film    Social History Main Topics  . Smoking status: Current Every Day Smoker -- 0.50 packs/day for 10 years    Types: Cigarettes  . Smokeless tobacco: Not on file     Comment: pt states thinking of quitting  . Alcohol Use: No  . Drug Use: No   Social History Narrative   Exercise: walking, 4 times/week for 30 minutes.   Current Outpatient Medications on File Prior to Visit  Medication Sig Dispense Refill  . aspirin EC 81 MG EC tablet Take 1 tablet (81 mg total) by mouth daily.    [name] atorvastatin (LIPITOR) 80 MG tablet Take 80 mg by mouth daily.    . clopidogrel (PLAVIX) 75 MG tablet Take 1 tablet (75 mg total) by mouth daily. 90 tablet 3  . empagliflozin (JARDIANCE) 10 MG TABS tablet Take 10 mg by mouth daily. 90 mg 3  . fluticasone (FLONASE) 50 MCG/ACT nasal spray Place 2 sprays into both nostrils daily. 16 g 6  . gabapentin (NEURONTIN) 100 MG capsule Take 1 capsule (100 mg total) by mouth at bedtime. 90 capsule 3   . levothyroxine (SYNTHROID) 50 MCG tablet Take 1 tablet (50 mcg total) by mouth daily. 90 tablet 3  . metFORMIN (GLUCOPHAGE-XR) 500 MG 24 hr tablet TAKE 1 TABLET BY MOUTH TWICE DAILY WITH MEALS AS DIRECTED 180 tablet 3  . nitroGLYCERIN (NITROSTAT) 0.4 MG SL tablet Place 1 tablet (0.4 mg total) under the tongue every 5 (five) minutes x 3 doses as needed for chest pain. 25 tablet 3   No current facility-administered medications on file prior to visit.   Allergies  Allergen Reactions  . Aspirin Other (See Comments)    Burns stomach  . Codeine Rash   Family History  Problem Relation Age of Onset  . Diabetes Mother   .  Heart disease Mother   . Stroke Father        2011  . Hyperlipidemia Father   . Diabetes Sister   . Heart disease Sister   . Heart disease Brother        congenital   PE: BP 136/68   Pulse 62   Ht 5' 3.5" (1.613 m)   Wt 119 lb (54 kg)   SpO2 99%   BMI 20.75 kg/m  Body mass index is 20.75 kg/m. Wt Readings from Last 3 Encounters:  09/22/19 119 lb (54 kg)  08/08/19 118 lb 3.2 oz (53.6 kg)  06/14/19 114 lb 12.8 oz (52.1 kg)   Constitutional: Normal  weight, in NAD Eyes: PERRLA, EOMI, no exophthalmos ENT: moist mucous membranes, no thyromegaly, no cervical lymphadenopathy Cardiovascular: RRR, No MRG Respiratory: CTA B Gastrointestinal: abdomen soft, NT, ND, BS+ Musculoskeletal: no deformities, strength intact in all 4 Skin: moist, warm, no rashes Neurological: no tremor with outstretched hands, DTR normal in all 4  ASSESSMENT: 1. DM2, non-insulin-dependent, uncontrolled, with complications - CAD, s/p NSTEMI 2019, s/p stent - PN   2. PN   3. HL  PLAN:  1. Patient with history of longstanding, uncontrolled, type 2 diabetes with control improved after having an NSTEMI in 08/2017 and having the stent placed.  We started Jardiance after this for its cardiovascular benefits.  She has no side effects from medication.  She continues exercising.  At last visit  sugars were improved and they were almost all at goal, with only few hypoglycemic weeks her sugars, after dietary indiscretions.  We did not change her regimen at that time. - we checked her HbA1c: 7.4% (higher) -At this visit, sugars are approximately same as before.  She does a wonderful job checking her sugars at different times of the day so I am confident that we are not missing many of her high blood sugars.  I am surprised about the above result -the new HbA1c does not completely correlate with her sugars at home.  These are at goal so for now I would advise him to continue on the same regimen.  If at next visit her HbA1c is still higher than expected, we may need to check a fructosamine level. - I suggested to:  Patient Instructions  Please continue: - Metformin ER 500 mg 2x a day with meals - Jardiance 10 mg before breakfast  Please return in 4 months with your sugar log.  - advised to check sugars at different times of the day - 1x a day, rotating check times - advised for yearly eye exams >> she is UTD - she is due for annual labs  - will have an APE next mo - return to clinic in 3-4 months     2. Diabetic PN -She denies new complaints -Continues on Neurontin 100 mg daily and B complex  3. HL -Reviewed latest lipid panel, which was examined in 09/2018: Lab Results  Component Value Date   CHOL 129 09/21/2018   HDL 56 09/21/2018   LDLCALC 60 09/21/2018   TRIG 65 09/21/2018   CHOLHDL 2.3 09/21/2018  -Continues the statin and fish oil without side effects  Carlus Pavlov, MD PhD [facility]

## 2019-09-22 NOTE — Patient Instructions (Signed)
Please continue: - Metformin ER 500 mg 2x a day with meals - Jardiance 10 mg before breakfast  Please return in 4 months with your sugar log. 

## 2019-09-28 ENCOUNTER — Ambulatory Visit
Admission: RE | Admit: 2019-09-28 | Discharge: 2019-09-28 | Disposition: A | Discharge status: Home / Self Care | Payer: BC Managed Care – PPO | Source: Ambulatory Visit | Attending: Family Medicine | Admitting: Family Medicine

## 2019-09-28 ENCOUNTER — Other Ambulatory Visit: Payer: Self-pay

## 2019-09-28 DIAGNOSIS — M8588 Other specified disorders of bone density and structure, other site: Secondary | ICD-10-CM

## 2019-09-28 DIAGNOSIS — M85851 Other specified disorders of bone density and structure, right thigh: Secondary | ICD-10-CM | POA: Diagnosis not present

## 2019-09-28 DIAGNOSIS — M810 Age-related osteoporosis without current pathological fracture: Secondary | ICD-10-CM | POA: Diagnosis not present

## 2019-09-28 DIAGNOSIS — Z1231 Encounter for screening mammogram for malignant neoplasm of breast: Secondary | ICD-10-CM | POA: Diagnosis not present

## 2019-09-28 DIAGNOSIS — E2839 Other primary ovarian failure: Secondary | ICD-10-CM

## 2019-09-28 DIAGNOSIS — Z780 Asymptomatic menopausal state: Secondary | ICD-10-CM | POA: Diagnosis not present

## 2019-10-03 ENCOUNTER — Telehealth: Payer: Self-pay | Admitting: Family Medicine

## 2019-10-03 NOTE — Progress Notes (Signed)
LVMTCB and schedule telephone visit with pcp

## 2019-10-07 ENCOUNTER — Other Ambulatory Visit: Payer: Self-pay

## 2019-10-07 ENCOUNTER — Telehealth (INDEPENDENT_AMBULATORY_CARE_PROVIDER_SITE_OTHER): Payer: BC Managed Care – PPO | Admitting: Family Medicine

## 2019-10-07 NOTE — Patient Instructions (Signed)
° ° ° °  If you have lab work done today you will be contacted with your lab results within the next 2 weeks.  If you have not heard from us then please contact us. The fastest way to get your results is to register for My Chart. ° ° °IF you received an x-ray today, you will receive an invoice from [facility]. Please contact [facility] at [phone] with questions or concerns regarding your invoice.  ° °IF you received labwork today, you will receive an invoice from [facility]. Please contact [facility] at [phone] with questions or concerns regarding your invoice.  ° °Our billing staff will not be able to assist you with questions regarding bills from these companies. ° °You will be contacted with the lab results as soon as they are available. The fastest way to get your results is to activate your My Chart account. Instructions are located on the last page of this paperwork. If you have not heard from us regarding the results in 2 weeks, please contact this office. °  ° ° ° °

## 2019-10-10 ENCOUNTER — Encounter: Payer: Self-pay | Admitting: Family Medicine

## 2019-10-17 ENCOUNTER — Ambulatory Visit (INDEPENDENT_AMBULATORY_CARE_PROVIDER_SITE_OTHER): Payer: BC Managed Care – PPO | Admitting: Family Medicine

## 2019-10-17 ENCOUNTER — Other Ambulatory Visit: Payer: Self-pay

## 2019-10-17 DIAGNOSIS — E1159 Type 2 diabetes mellitus with other circulatory complications: Secondary | ICD-10-CM

## 2019-10-17 DIAGNOSIS — I214 Non-ST elevation (NSTEMI) myocardial infarction: Secondary | ICD-10-CM

## 2019-10-17 DIAGNOSIS — M810 Age-related osteoporosis without current pathological fracture: Secondary | ICD-10-CM

## 2019-10-18 ENCOUNTER — Other Ambulatory Visit: Payer: Self-pay | Admitting: Family Medicine

## 2019-10-18 LAB — CMP14+EGFR
ALT: 35 IU/L — ABNORMAL HIGH (ref 0–32)
AST: 23 IU/L (ref 0–40)
Albumin/Globulin Ratio: 2.2 (ref 1.2–2.2)
Albumin: 4.7 g/dL (ref 3.8–4.9)
Alkaline Phosphatase: 75 IU/L (ref 39–117)
BUN/Creatinine Ratio: 21 (ref 9–23)
BUN: 16 mg/dL (ref 6–24)
Bilirubin Total: 0.4 mg/dL (ref 0.0–1.2)
CO2: 20 mmol/L (ref 20–29)
Calcium: 9.9 mg/dL (ref 8.7–10.2)
Chloride: 101 mmol/L (ref 96–106)
Creatinine, Ser: 0.76 mg/dL (ref 0.57–1.00)
GFR calc Af Amer: 100 mL/min/{1.73_m2} (ref >59)
GFR calc non Af Amer: 87 mL/min/{1.73_m2} (ref >59)
Globulin, Total: 2.1 g/dL (ref 1.5–4.5)
Glucose: 135 mg/dL — ABNORMAL HIGH (ref 65–99)
Potassium: 4.4 mmol/L (ref 3.5–5.2)
Sodium: 138 mmol/L (ref 134–144)
Total Protein: 6.8 g/dL (ref 6.0–8.5)

## 2019-10-18 LAB — CBC
Hematocrit: 43.9 % (ref 34.0–46.6)
Hemoglobin: 15.1 g/dL (ref 11.1–15.9)
MCH: 31.2 pg (ref 26.6–33.0)
MCHC: 34.4 g/dL (ref 31.5–35.7)
MCV: 91 fL (ref 79–97)
Platelets: 330 10*3/uL (ref 150–450)
RBC: 4.84 x10E6/uL (ref 3.77–5.28)
RDW: 12.4 % (ref 11.7–15.4)
WBC: 9.7 10*3/uL (ref 3.4–10.8)

## 2019-10-18 LAB — PTH, INTACT AND CALCIUM: PTH: 43 pg/mL (ref 15–65)

## 2019-10-18 LAB — LIPID PANEL
Chol/HDL Ratio: 3 ratio (ref 0.0–4.4)
Cholesterol, Total: 136 mg/dL (ref 100–199)
HDL: 45 mg/dL (ref >39)
LDL Chol Calc (NIH): 73 mg/dL (ref 0–99)
Triglycerides: 94 mg/dL (ref 0–149)
VLDL Cholesterol Cal: 18 mg/dL (ref 5–40)

## 2019-10-18 LAB — VITAMIN D 25 HYDROXY (VIT D DEFICIENCY, FRACTURES): Vit D, 25-Hydroxy: 20.6 ng/mL — ABNORMAL LOW (ref 30.0–100.0)

## 2019-10-18 MED ORDER — ALENDRONATE SODIUM 70 MG PO TABS: 70.0000 mg | ORAL_TABLET | ORAL | 3 refills | Status: DC

## 2019-10-19 ENCOUNTER — Other Ambulatory Visit: Payer: Self-pay

## 2019-10-19 ENCOUNTER — Ambulatory Visit (INDEPENDENT_AMBULATORY_CARE_PROVIDER_SITE_OTHER): Payer: BC Managed Care – PPO | Admitting: Family Medicine

## 2019-10-19 ENCOUNTER — Encounter: Payer: Self-pay | Admitting: Family Medicine

## 2019-10-19 VITALS — BP 130/73 | HR 56 | Temp 97.2°F | Resp 16 | Ht 63.5 in | Wt 119.8 lb

## 2019-10-19 DIAGNOSIS — E1140 Type 2 diabetes mellitus with diabetic neuropathy, unspecified: Secondary | ICD-10-CM

## 2019-10-19 DIAGNOSIS — Z0001 Encounter for general adult medical examination with abnormal findings: Secondary | ICD-10-CM | POA: Diagnosis not present

## 2019-10-19 DIAGNOSIS — Z0000 Encounter for general adult medical examination without abnormal findings: Secondary | ICD-10-CM

## 2019-10-19 MED ORDER — LEVOTHYROXINE SODIUM 50 MCG PO TABS: 50.0000 ug | ORAL_TABLET | Freq: Every day | ORAL | 3 refills | Status: AC

## 2019-10-19 MED ORDER — GABAPENTIN 100 MG PO CAPS: 100.0000 mg | ORAL_CAPSULE | Freq: Every day | ORAL | 3 refills | Status: AC

## 2019-10-19 NOTE — Patient Instructions (Addendum)
Please establish care with Dr. Leretha Pol in September.    If you have lab work done today you will be contacted with your lab results within the next 2 weeks.  If you have not heard from [place] then please contact us. The fastest way to get your results is to register for My Chart.   IF you received an x-ray today, you will receive an invoice from [facility]. Please contact [facility] at [phone] with questions or concerns regarding your invoice.   IF you received labwork today, you will receive an invoice from [facility]. Please contact [facility] at [phone] with questions or concerns regarding your invoice.   Our billing staff will not be able to assist you with questions regarding bills from these companies.  You will be contacted with the lab results as soon as they are available. The fastest way to get your results is to activate your My Chart account. Instructions are located on the last page of this paperwork. If you have not heard from [place] regarding the results in 2 weeks, please contact this office.     Health Maintenance for Postmenopausal Women Menopause is a normal process in which your ability to get pregnant comes to an end. This process happens slowly over many months or years, usually between the ages of 42 and 17. Menopause is complete when you have missed your menstrual periods for 12 months. It is important to talk with your health care provider about some of the most common conditions that affect women after menopause (postmenopausal women). These include heart disease, cancer, and bone loss (osteoporosis). Adopting a healthy lifestyle and getting preventive care can help to promote your health and wellness. The actions you take can also lower your chances of developing some of these common conditions. What should I know about menopause? During menopause, you may get a number of symptoms, such as:  Hot flashes. These can be moderate or severe.  Night  sweats.  Decrease in sex drive.  Mood swings.  Headaches.  Tiredness.  Irritability.  Memory problems.  Insomnia. Choosing to treat or not to treat these symptoms is a decision that you make with your health care provider. Do I need hormone replacement therapy?  Hormone replacement therapy is effective in treating symptoms that are caused by menopause, such as hot flashes and night sweats.  Hormone replacement carries certain risks, especially as you become older. If you are thinking about using estrogen or estrogen with progestin, discuss the benefits and risks with your health care provider. What is my risk for heart disease and stroke? The risk of heart disease, heart attack, and stroke increases as you age. One of the causes may be a change in the body's hormones during menopause. This can affect how your body uses dietary fats, triglycerides, and cholesterol. Heart attack and stroke are medical emergencies. There are many things that you can do to help prevent heart disease and stroke. Watch your blood pressure  High blood pressure causes heart disease and increases the risk of stroke. This is more likely to develop in people who have high blood pressure readings, are of African descent, or are overweight.  Have your blood pressure checked: ? Every 3-5 years if you are 51-2 years of age. ? Every year if you are 57 years old or older. Eat a healthy diet   Eat a diet that includes plenty of vegetables, fruits, low-fat dairy products, and lean protein.  Do not eat a lot of foods that  are high in solid fats, added sugars, or sodium. Get regular exercise Get regular exercise. This is one of the most important things you can do for your health. Most adults should:  Try to exercise for at least 150 minutes each week. The exercise should increase your heart rate and make you sweat (moderate-intensity exercise).  Try to do strengthening exercises at least twice each week. Do  these in addition to the moderate-intensity exercise.  Spend less time sitting. Even light physical activity can be beneficial. Other tips  Work with your health care provider to achieve or maintain a healthy weight.  Do not use any products that contain nicotine or tobacco, such as cigarettes, e-cigarettes, and chewing tobacco. If you need help quitting, ask your health care provider.  Know your numbers. Ask your health care provider to check your cholesterol and your blood sugar (glucose). Continue to have your blood tested as directed by your health care provider. Do I need screening for cancer? Depending on your health history and family history, you may need to have cancer screening at different stages of your life. This may include screening for:  Breast cancer.  Cervical cancer.  Lung cancer.  Colorectal cancer. What is my risk for osteoporosis? After menopause, you may be at increased risk for osteoporosis. Osteoporosis is a condition in which bone destruction happens more quickly than new bone creation. To help prevent osteoporosis or the bone fractures that can happen because of osteoporosis, you may take the following actions:  If you are 12-63 years old, get at least 1,000 mg of calcium and at least 600 mg of vitamin D per day.  If you are older than age 55 but younger than age 62, get at least 1,200 mg of calcium and at least 600 mg of vitamin D per day.  If you are older than age 23, get at least 1,200 mg of calcium and at least 800 mg of vitamin D per day. Smoking and drinking excessive alcohol increase the risk of osteoporosis. Eat foods that are rich in calcium and vitamin D, and do weight-bearing exercises several times each week as directed by your health care provider. How does menopause affect my mental health? Depression may occur at any age, but it is more common as you become older. Common symptoms of depression include:  Low or sad mood.  Changes in sleep  patterns.  Changes in appetite or eating patterns.  Feeling an overall lack of motivation or enjoyment of activities that you previously enjoyed.  Frequent crying spells. Talk with your health care provider if you think that you are experiencing depression. General instructions See your health care provider for regular wellness exams and vaccines. This may include:  Scheduling regular health, dental, and eye exams.  Getting and maintaining your vaccines. These include: ? Influenza vaccine. Get this vaccine each year before the flu season begins. ? Pneumonia vaccine. ? Shingles vaccine. ? Tetanus, diphtheria, and pertussis (Tdap) booster vaccine. Your health care provider may also recommend other immunizations. Tell your health care provider if you have ever been abused or do not feel safe at home. Summary  Menopause is a normal process in which your ability to get pregnant comes to an end.  This condition causes hot flashes, night sweats, decreased interest in sex, mood swings, headaches, or lack of sleep.  Treatment for this condition may include hormone replacement therapy.  Take actions to keep yourself healthy, including exercising regularly, eating a healthy diet, watching your weight,  and checking your blood pressure and blood sugar levels.  Get screened for cancer and depression. Make sure that you are up to date with all your vaccines. This information is not intended to replace advice given to you by your health care provider. Make sure you discuss any questions you have with your health care provider. Document Revised: 06/23/2018 Document Reviewed: 06/23/2018 Elsevier Patient Education  2020 [employer].

## 2019-10-19 NOTE — Progress Notes (Signed)
Chief Complaint  Patient presents with  . Annual Exam    no pap    Subjective:  Carmen Anderson is a 59 y.o. female here for a health maintenance visit.  Patient is established pt  Patient Active Problem List   Diagnosis Date Noted  . Iatrogenic hypotension 06/19/2018  . Former smoker 06/19/2018  . Diabetes mellitus with cardiac complication (HCC) 06/19/2018  . Non-ST elevation (NSTEMI) myocardial infarction (HCC) 09/08/2017  . Elevated troponin   . Abnormal cardiovascular stress test   . Essential hypertension   . Chest pain 09/04/2017  . Diabetic peripheral neuropathy associated with type 2 diabetes mellitus (HCC) 04/13/2017  . Type 2 diabetes mellitus with diabetic neuropathy, without long-term current use of insulin (HCC) 09/25/2015  . Hyperlipidemia   . Allergic rhinitis   . Hypothyroid     Past Medical History:  Diagnosis Date  . DM type 2 (diabetes mellitus, type 2) (HCC)   . History of hiatal hernia   . Hyperlipidemia   . Hypertension   . Hypothyroidism     Past Surgical History:  Procedure Laterality Date  . ABDOMINAL HYSTERECTOMY  1995  . CARDIAC CATHETERIZATION    . CORONARY STENT INTERVENTION N/A 09/07/2017   Procedure: CORONARY STENT INTERVENTION;  Surgeon: Runell Gess, MD;  Location: [facility];  Service: Cardiovascular;  Laterality: N/A;  . LEFT HEART CATH AND CORONARY ANGIOGRAPHY N/A 09/07/2017   Procedure: LEFT HEART CATH AND CORONARY ANGIOGRAPHY;  Surgeon: Runell Gess, MD;  Location: [facility];  Service: Cardiovascular;  Laterality: N/A;  . STAPEDECTOMY Right ~ 2002     Outpatient Medications Prior to Visit  Medication Sig Dispense Refill  . alendronate (FOSAMAX) 70 MG tablet Take 1 tablet (70 mg total) by mouth every 7 (seven) days. Take with a full glass of water on an empty stomach. 12 tablet 3  . aspirin EC 81 MG EC tablet Take 1 tablet (81 mg total) by mouth daily.    [name] atorvastatin (LIPITOR) 80 MG tablet Take 80 mg  by mouth daily.    . clopidogrel (PLAVIX) 75 MG tablet Take 1 tablet (75 mg total) by mouth daily. 90 tablet 3  . empagliflozin (JARDIANCE) 10 MG TABS tablet Take 10 mg by mouth daily. 90 mg 3  . fluticasone (FLONASE) 50 MCG/ACT nasal spray Place 2 sprays into both nostrils daily. 16 g 6  . gabapentin (NEURONTIN) 100 MG capsule Take 1 capsule (100 mg total) by mouth at bedtime. 90 capsule 3  . levothyroxine (SYNTHROID) 50 MCG tablet Take 1 tablet (50 mcg total) by mouth daily. 90 tablet 3  . metFORMIN (GLUCOPHAGE-XR) 500 MG 24 hr tablet TAKE 1 TABLET BY MOUTH TWICE DAILY WITH MEALS AS DIRECTED 180 tablet 3  . nitroGLYCERIN (NITROSTAT) 0.4 MG SL tablet Place 1 tablet (0.4 mg total) under the tongue every 5 (five) minutes x 3 doses as needed for chest pain. 25 tablet 3   No facility-administered medications prior to visit.    Allergies  Allergen Reactions  . Aspirin Other (See Comments)    Burns stomach  . Codeine Rash     Family History  Problem Relation Age of Onset  . Diabetes Mother   . Heart disease Mother   . Stroke Father        2011  . Hyperlipidemia Father   . Diabetes Sister   . Heart disease Sister   . Heart disease Brother  congenital     Health Habits: Dental Exam: up to date Eye Exam: up to date Exercise: 5 times/week on average Current exercise activities: walking/running Diet:   Social History   Socioeconomic History  . Marital status: Married    Spouse name: Not on file  . Number of children: Not on file  . Years of education: 62  . Highest education level: Not on file  Occupational History  . 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 Smoking status: Former Smoker    Packs/day: 0.50    Years: 27.00    Pack years: 13.50    Types: Cigarettes    Quit date: 09/05/2017    Years since quitting: 2.1  . Smokeless tobacco: Never Used  Substance and Sexual Activity  . Alcohol use: No  . Drug  use: No  . Sexual activity: Yes    Birth control/protection: None  Other Topics Concern  . Not on file  Social History Narrative   Exercise: walking, 4 times/week for 30 minutes.   Social Determinants of Health   Financial Resource Strain:   . Difficulty of Paying Living Expenses:   Food Insecurity:   . Worried About Charity fundraiser in the Last Year:   . Arboriculturist in the Last Year:   Transportation Needs:   . Film/video editor (Medical):   [name] Lack of Transportation (Non-Medical):   Physical Activity:   . Days of Exercise per Week:   . Minutes of Exercise per Session:   Stress:   . Feeling of Stress :   Social Connections:   . Frequency of Communication with Friends and Family:   . Frequency of Social Gatherings with Friends and Family:   . Attends Religious Services:   . Active Member of Clubs or Organizations:   . Attends Archivist Meetings:   [name] Marital Status:   Intimate Partner Violence:   . Fear of Current or Ex-Partner:   . Emotionally Abused:   [name] Physically Abused:   . Sexually Abused:    Social History   Substance and Sexual Activity  Alcohol Use No   Social History   Tobacco Use  Smoking Status Former Smoker  . Packs/day: 0.50  . Years: 27.00  . Pack years: 13.50  . Types: Cigarettes  . Quit date: 09/05/2017  . Years since quitting: 2.1  Smokeless Tobacco Never Used   Social History   Substance and Sexual Activity  Drug Use No    GYN: Sexual Health Menstrual status: regular menses LMP: No LMP recorded. Patient has had a hysterectomy. Last pap smear: see HM section History of abnormal pap smears:  Sexually active: with female partner Current contraception: n/a  Health Maintenance: See under health Maintenance activity for review of completion dates as well. Immunization History  Administered Date(s) Administered  . Influenza Split 04/13/2012, 07/13/2013, 04/11/2015  . Influenza,inj,Quad PF,6+ Mos 04/30/2016  .  Influenza-Unspecified 04/07/2014, 04/08/2018  . Pneumococcal Conjugate-13 12/18/2015  . Pneumococcal Polysaccharide-23 04/07/2011, 01/02/2017  . Td 07/14/2005  . Tdap 10/20/2012      Depression Screen-PHQ2/9 Depression screen [facility] 2/9 10/19/2019 10/07/2019 12/16/2018 09/21/2018 06/19/2018  Decreased Interest 0 0 0 0 0  Down, Depressed, Hopeless 0 0 0 0 0  PHQ - 2 Score 0 0 0 0 0       Depression Severity and Treatment Recommendations:  0-4= None  5-9= Mild / Treatment: Support, educate to call if  worse; return in one month  10-14= Moderate / Treatment: Support, watchful waiting; Antidepressant or Psycotherapy  15-19= Moderately severe / Treatment: Antidepressant OR Psychotherapy  >= 20 = Major depression, severe / Antidepressant AND Psychotherapy    Review of Systems   ROS  See HPI for ROS as well.   Review of Systems  Constitutional: Negative for activity change, appetite change, chills and fever.  HENT: Negative for congestion, nosebleeds, trouble swallowing and voice change.   Respiratory: Negative for cough, shortness of breath and wheezing.   Gastrointestinal: Negative for diarrhea, nausea and vomiting.  Genitourinary: Negative for difficulty urinating, dysuria, flank pain and hematuria.  Musculoskeletal: Negative for back pain, joint swelling and neck pain.  Neurological: Negative for dizziness, speech difficulty, light-headedness and numbness.  See HPI. All other review of systems negative.    Objective:   Vitals:   10/19/19 0758  BP: 130/73  Pulse: (!) 56  Resp: 16  Temp: (!) 97.2 F (36.2 C)  TempSrc: Temporal  SpO2: 100%  Weight: 119 lb 12.8 oz (54.3 kg)  Height: 5' 3.5" (1.613 m)    Body mass index is 20.89 kg/m.  Physical Exam  Physical Exam  Constitutional: Oriented to person, place, and time. Appears well-developed and well-nourished.  HENT:  Head: Normocephalic and atraumatic.  Eyes: Conjunctivae and EOM are normal.  Neck: supple, no  thyromegaly Cardiovascular: Normal rate, regular rhythm, normal heart sounds and intact distal pulses.  No murmur heard. Pulmonary/Chest: Effort normal and breath sounds normal. No stridor. No respiratory distress. Has no wheezes.  Abdomen: nondistended, normoactive bs, soft, nontender Neurological: Is alert and oriented to person, place, and time.  Skin: Skin is warm. Capillary refill takes less than 2 seconds.  Psychiatric: Has a normal mood and affect. Behavior is normal. Judgment and thought content normal.     Assessment/Plan:   Patient was seen for a health maintenance exam.  Counseled the patient on health maintenance issues. Reviewed her health mainteance schedule and ordered appropriate tests (see orders.) Counseled on regular exercise and weight management. Recommend regular eye exams and dental cleaning.   The following issues were addressed today for health maintenance:   Cleora was seen today for annual exam.  Diagnoses and all orders for this visit:  Encounter for health maintenance examination in adult  -  Women's Health Maintenance Plan Advised monthly breast exam and annual mammogram Advised dental exam every six months Discussed stress management Discussed pap smear screening guidelines  Type 2 diabetes mellitus with diabetic neuropathy, without long-term current use of insulin (HCC)  -  Referral to eye exam  No follow-ups on file.    Body mass index is 20.89 kg/m.:  Discussed the patient's BMI with patient. The BMI body mass index is 20.89 kg/m.     Future Appointments  Date Time Provider Department Center  01/27/2020  9:00 AM Carlus Pavlov, MD [id number] None    Patient Instructions       If you have lab work done today you will be contacted with your lab results within the next 2 weeks.  If you have not heard from [place] then please contact us. The fastest way to get your results is to register for My Chart.   IF you received an x-ray today,  you will receive an invoice from [facility]. Please contact [facility] at [phone] with questions or concerns regarding your invoice.   IF you received labwork today, you will receive an invoice from [employer]. Please contact [facility] at  [phone] with questions or concerns regarding your invoice.   Our billing staff will not be able to assist you with questions regarding bills from these companies.  You will be contacted with the lab results as soon as they are available. The fastest way to get your results is to activate your My Chart account. Instructions are located on the last page of this paperwork. If you have not heard from [place] regarding the results in 2 weeks, please contact this office.

## 2019-10-29 ENCOUNTER — Other Ambulatory Visit: Payer: Self-pay | Admitting: Internal Medicine

## 2019-10-29 DIAGNOSIS — E1140 Type 2 diabetes mellitus with diabetic neuropathy, unspecified: Secondary | ICD-10-CM

## 2019-12-07 NOTE — Progress Notes (Signed)
Erroneous

## 2019-12-28 ENCOUNTER — Other Ambulatory Visit: Payer: Self-pay

## 2019-12-28 DIAGNOSIS — I214 Non-ST elevation (NSTEMI) myocardial infarction: Secondary | ICD-10-CM

## 2019-12-28 MED ORDER — CLOPIDOGREL BISULFATE 75 MG PO TABS: 75.0000 mg | ORAL_TABLET | Freq: Every day | ORAL | 1 refills | Status: DC

## 2019-12-28 NOTE — Telephone Encounter (Signed)
This is Dr. Kelly's pt. °

## 2020-01-27 ENCOUNTER — Other Ambulatory Visit: Payer: Self-pay

## 2020-01-27 ENCOUNTER — Ambulatory Visit: Payer: BC Managed Care – PPO | Admitting: Internal Medicine

## 2020-01-27 ENCOUNTER — Encounter: Payer: Self-pay | Admitting: Internal Medicine

## 2020-01-27 VITALS — BP 100/60 | HR 62 | Ht 63.5 in | Wt 114.0 lb

## 2020-01-27 DIAGNOSIS — E1142 Type 2 diabetes mellitus with diabetic polyneuropathy: Secondary | ICD-10-CM

## 2020-01-27 DIAGNOSIS — E1140 Type 2 diabetes mellitus with diabetic neuropathy, unspecified: Secondary | ICD-10-CM | POA: Diagnosis not present

## 2020-01-27 DIAGNOSIS — E785 Hyperlipidemia, unspecified: Secondary | ICD-10-CM

## 2020-01-27 LAB — POCT GLYCOSYLATED HEMOGLOBIN (HGB A1C): Hemoglobin A1C: 7 % — AB (ref 4.0–5.6)

## 2020-01-27 NOTE — Patient Instructions (Signed)
Please continue: - Metformin ER 500 mg 2x a day with meals - Jardiance 10 mg before breakfast  Please return in 4 months with your sugar log.

## 2020-01-27 NOTE — Addendum Note (Signed)
Addended by: Darliss Ridgel I on: 01/27/2020 09:24 AM   Modules accepted: Orders

## 2020-01-27 NOTE — Progress Notes (Signed)
Patient ID: Carmen Anderson, female   DOB: 07/24/1960, 59 y.o.   MRN: [id number]   This visit occurred during the SARS-CoV-2 public health emergency.  Safety protocols were in place, including screening questions prior to the visit, additional usage of staff PPE, and extensive cleaning of exam room while observing appropriate contact time as indicated for disinfecting solutions.   HPI: Carmen LevyCarla L Anderson is a 59 y.o.-year-old female, initially referred by her PCP, Olean Reeeborah Gessner, NP, returning for follow-up for DM2, dx in 2001-2002, non-insulin-dependent, now more controlled, with complications (CAD - s/p NSTEMI 2019, s/p stent; PN). Last visit 4 months ago.  Since last visit, her sister-in-law moved in with them and she is cooking some of their meals.  She is eating an earlier dinner and sometimes is hungry and has to have a snack at bedtime.  However, sugars improved, rather than worsened since last visit.  Reviewed HbA1c levels: Lab Results  Component Value Date   HGBA1C 7.4 (A) 09/22/2019   HGBA1C 6.4 (A) 05/18/2019   HGBA1C 7.6 (A) 12/15/2018   HGBA1C 6.7 (A) 08/16/2018   HGBA1C 6.9 (A) 04/13/2018   HGBA1C 6.6 (A) 12/14/2017   HGBA1C 6.9 (H) 09/06/2017   HGBA1C 7 08/14/2017   HGBA1C 6.8 04/13/2017   HGBA1C 6.7 11/25/2016   HGBA1C 7.0 06/03/2016   HGBA1C 7.0 03/04/2016   HGBA1C 7.0 12/03/2015   HGBA1C 7.9 (H) 09/05/2015   HGBA1C 7.7 (H) 05/09/2015   HGBA1C 7.0 10/27/2014   HGBA1C 6.7 04/28/2014   HGBA1C 7.0 12/14/2013   HGBA1C 6.8 08/03/2013   HGBA1C 6.7 01/26/2013   Pt is on a regimen of: - Metformin ER 500 mg once a day (decreased by PCP in 09/2018) >> increased back to twice a day in 12/2018. - Januvia 100 mg daily in am >> Jardiance 10 mg before breakfast  Pt checks her sugars 2x a day per review of her excellent log: - am: 79-120, 140 >> 95-120, 127 >> 79-121, 127 >> 76-122 - 2h after b'fast: 110-179 >> 124-178 >> 130-167, 177 >> 143-160, 170 - before lunch:  86-119 >>  75-112 >> 84-111 >> 79-119 >> 79-98, 118 - 2h after lunch:140-181, 199 >> 140-169, 176 >> 150-169 >> 145-171 - before dinner: 87-137, 149 >> 88-124, 160 >> 92-119, 127 >> 84-119 - 2h after dinner:161-177 >> 135-177, 191 >> 146, 154-177 >> 141-160 - bedtime:100-140, 160 >> 97-125, 159 >> 89-123 >> 92-115 - nighttime: n/c >> n/c Lowest sugar was 38 ... >> 79 >> 76 ; she has hypoglycemia awareness in the 50s. Highestin: 177 >> 187.  Glucometer: FreeStyle >> [facility]  Pt's meals are: - snack:  1/2 yoghurt, PB + bread - Breakfast: wheat waffle + sugar free syrup or wheat muffin w/ 1 egg  - Lunch: salads or [place] sandwich on wheat bread - Dinner: soups, salad, meat 2x a weekly (chicken, salmon) + veggies - Snacks: pineapple, celery  She continues to exercise 4-5 times a week on the treadmill.  -No CKD, last BUN/creatinine:  Lab Results  Component Value Date   BUN 16 10/17/2019   CREATININE 0.76 10/17/2019  On lisinopril 2.5. -+ HL; last set of lipids: Lab Results  Component Value Date   CHOL 136 10/17/2019   HDL 45 10/17/2019   LDLCALC 73 10/17/2019   TRIG 94 10/17/2019   CHOLHDL 3.0 10/17/2019  On Lipitor, fish oil. - last eye exam was in 02/2019: No DR; [facility].  -she has Numbness and tingling in her  feet.  She is on Neurontin and B complex  She had a NSTEMI in 08/2017.  At that time, she had a catheterization and had 90% blockage on LAD.  She had a stent placed.   She has HTN and controlled hypothyroidism.  Latest TSH was normal: Lab Results  Component Value Date   TSH 1.770 09/21/2018   ROS: Constitutional: no weight gain/no weight loss, no fatigue, no subjective hyperthermia, no subjective hypothermia Eyes: no blurry vision, no xerophthalmia ENT: no sore throat, no nodules palpated in neck, no dysphagia, no odynophagia, no hoarseness Cardiovascular: no CP/no SOB/no palpitations/no leg swelling Respiratory: no cough/no SOB/no wheezing Gastrointestinal: no N/no  V/no D/no C/no acid reflux Musculoskeletal: no muscle aches/no joint aches Skin: no rashes, no hair loss Neurological: no tremors/+ numbness/+ tingling/no dizziness  I reviewed pt's medications, allergies, PMH, social hx, family hx, and changes were documented in the history of present illness. Otherwise, unchanged from my initial visit note.  Past Medical History:  Diagnosis Date  . DM type 2 (diabetes mellitus, type 2) (HCC)   . History of hiatal hernia   . Hyperlipidemia   . Hypertension   . Hypothyroidism    Past Surgical History:  Procedure Laterality Date  . ABDOMINAL HYSTERECTOMY  1995  . CARDIAC CATHETERIZATION    . CORONARY STENT INTERVENTION N/A 09/07/2017   Procedure: CORONARY STENT INTERVENTION;  Surgeon: Runell Gess, MD;  Location: [facility];  Service: Cardiovascular;  Laterality: N/A;  . LEFT HEART CATH AND CORONARY ANGIOGRAPHY N/A 09/07/2017   Procedure: LEFT HEART CATH AND CORONARY ANGIOGRAPHY;  Surgeon: Runell Gess, MD;  Location: [facility];  Service: Cardiovascular;  Laterality: N/A;  . STAPEDECTOMY Right ~ 2002   Social History   Social History  . Marital Status: Married    Spouse Name: N/A  . Number of Children: 0  . Years of Education: 12   Occupational History  . property asst. Production designer, theatre/television/film    Social History Main Topics  . Smoking status: Current Every Day Smoker -- 0.50 packs/day for 10 years    Types: Cigarettes  . Smokeless tobacco: Not on file     Comment: pt states thinking of quitting  . Alcohol Use: No  . Drug Use: No   Social History Narrative   Exercise: walking, 4 times/week for 30 minutes.   Current Outpatient Medications on File Prior to Visit  Medication Sig Dispense Refill  . metFORMIN (GLUCOPHAGE-XR) 500 MG 24 hr tablet TAKE 1 TABLET BY MOUTH TWICE DAILY WITH MEALS AS DIRECTED 180 tablet 3  . alendronate (FOSAMAX) 70 MG tablet Take 1 tablet (70 mg total) by mouth every 7 (seven) days. Take with a full glass  of water on an empty stomach. 12 tablet 3  . aspirin EC 81 MG EC tablet Take 1 tablet (81 mg total) by mouth daily.    [name] atorvastatin (LIPITOR) 80 MG tablet Take 80 mg by mouth daily.    . clopidogrel (PLAVIX) 75 MG tablet Take 1 tablet (75 mg total) by mouth daily. 90 tablet 1  . empagliflozin (JARDIANCE) 10 MG TABS tablet Take 10 mg by mouth daily. 90 mg 3  . fluticasone (FLONASE) 50 MCG/ACT nasal spray Place 2 sprays into both nostrils daily. 16 g 6  . gabapentin (NEURONTIN) 100 MG capsule Take 1 capsule (100 mg total) by mouth at bedtime. 90 capsule 3  . levothyroxine (SYNTHROID) 50 MCG tablet Take 1 tablet (50 mcg total) by mouth daily.  90 tablet 3  . nitroGLYCERIN (NITROSTAT) 0.4 MG SL tablet Place 1 tablet (0.4 mg total) under the tongue every 5 (five) minutes x 3 doses as needed for chest pain. 25 tablet 3   No current facility-administered medications on file prior to visit.   Allergies  Allergen Reactions  . Aspirin Other (See Comments)    Burns stomach  . Codeine Rash   Family History  Problem Relation Age of Onset  . Diabetes Mother   . Heart disease Mother   . Stroke Father        2011  . Hyperlipidemia Father   . Diabetes Sister   . Heart disease Sister   . Heart disease Brother        congenital   PE: BP 100/60   Pulse 62   Ht 5' 3.5" (1.613 m)   Wt 114 lb (51.7 kg)   SpO2 97%   BMI 19.88 kg/m  Body mass index is 19.88 kg/m. Wt Readings from Last 3 Encounters:  01/27/20 114 lb (51.7 kg)  10/19/19 119 lb 12.8 oz (54.3 kg)  09/22/19 119 lb (54 kg)   Constitutional: normal weight, in NAD Eyes: PERRLA, EOMI, no exophthalmos ENT: moist mucous membranes, no thyromegaly, no cervical lymphadenopathy Cardiovascular: RRR, No MRG Respiratory: CTA B Gastrointestinal: abdomen soft, NT, ND, BS+ Musculoskeletal: no deformities, strength intact in all 4 Skin: moist, warm, no rashes Neurological: no tremor with outstretched hands, DTR normal in all  4  ASSESSMENT: 1. DM2, non-insulin-dependent, uncontrolled, with complications - CAD, s/p NSTEMI 2019, s/p stent - PN   2. PN   3. HL  PLAN:  1. Patient with history of longstanding, uncontrolled, type 2 diabetes, with improved control after her NSTEMI + stent placement in 08/2017.  We started Jardiance after this mostly for its cardiovascular benefits.  She has no side effects from the medication.  She also continues exercising.  At last visit, HbA1c was slightly higher, at 7.4%.  However, at that time, sugars were stable and she did a great job checking at different times of the day.  I was surprised about the increase in HbA1c and we discussed that if at this visit the HbA1c is still discrepant with her sugars at home, we may need a fructosamine level.  We did not change her diabetic regimen at that time. - since last OV, sugars are a little better, at goal at all times of the day >> no need to change the regimen.  States she moved her dinner earlier since last visit, she is having a small snack at bedtime as she is hungry.  I do not feel that it is absolutely necessary that she stops this. - I suggested to:  Patient Instructions  Please continue: - Metformin ER 500 mg 2x a day with meals - Jardiance 10 mg before breakfast  Please return in 4 months with your sugar log.  - we checked her HbA1c: 7.0% (better) - advised to check sugars at different times of the day - 1x a day, rotating check times - advised for yearly eye exams >> she is UTD - return to clinic in 4 months     2. Diabetic PN -No new complaints -Continues on Neurontin low-dose, 100 mg daily, and B complex  3. HL -Review latest lipid panel from 10/2019: LDL slightly above goal, the rest of the fractions excellent: Lab Results  Component Value Date   CHOL 136 10/17/2019   HDL 45 10/17/2019   LDLCALC 73  10/17/2019   TRIG 94 10/17/2019   CHOLHDL 3.0 10/17/2019  -Continues the statin and fish oil without side  effects  Carlus Pavlov, MD PhD [facility]

## 2020-01-28 LAB — HM DIABETES EYE EXAM

## 2020-03-01 ENCOUNTER — Encounter: Payer: Self-pay | Admitting: Internal Medicine

## 2020-03-15 ENCOUNTER — Ambulatory Visit: Payer: BC Managed Care – PPO | Admitting: Family Medicine

## 2020-03-15 ENCOUNTER — Encounter: Payer: Self-pay | Admitting: Family Medicine

## 2020-03-15 ENCOUNTER — Other Ambulatory Visit: Payer: Self-pay

## 2020-03-15 VITALS — BP 101/47 | HR 65 | Temp 98.0°F | Ht 63.5 in | Wt 114.0 lb

## 2020-03-15 DIAGNOSIS — E1159 Type 2 diabetes mellitus with other circulatory complications: Secondary | ICD-10-CM | POA: Diagnosis not present

## 2020-03-15 DIAGNOSIS — E034 Atrophy of thyroid (acquired): Secondary | ICD-10-CM | POA: Diagnosis not present

## 2020-03-15 DIAGNOSIS — Z1211 Encounter for screening for malignant neoplasm of colon: Secondary | ICD-10-CM | POA: Diagnosis not present

## 2020-03-15 DIAGNOSIS — Z23 Encounter for immunization: Secondary | ICD-10-CM | POA: Diagnosis not present

## 2020-03-15 DIAGNOSIS — M810 Age-related osteoporosis without current pathological fracture: Secondary | ICD-10-CM | POA: Insufficient documentation

## 2020-03-15 DIAGNOSIS — E782 Mixed hyperlipidemia: Secondary | ICD-10-CM | POA: Diagnosis not present

## 2020-03-15 DIAGNOSIS — E559 Vitamin D deficiency, unspecified: Secondary | ICD-10-CM

## 2020-03-15 MED ORDER — FLUTICASONE PROPIONATE 50 MCG/ACT NA SUSP: 2.0000 | Freq: Every day | NASAL | 6 refills | Status: DC

## 2020-03-15 MED ORDER — RISEDRONATE SODIUM 150 MG PO TABS: 150.0000 mg | ORAL_TABLET | ORAL | 11 refills | Status: DC

## 2020-03-15 NOTE — Patient Instructions (Addendum)
   If you have lab work done today you will be contacted with your lab results within the next 2 weeks.  If you have not heard from us then please contact us. The fastest way to get your results is to register for My Chart.   IF you received an x-ray today, you will receive an invoice from [facility]. Please contact [facility] at [phone] with questions or concerns regarding your invoice.   IF you received labwork today, you will receive an invoice from [facility]. Please contact [facility] at [phone] with questions or concerns regarding your invoice.   Our billing staff will not be able to assist you with questions regarding bills from these companies.  You will be contacted with the lab results as soon as they are available. The fastest way to get your results is to activate your My Chart account. Instructions are located on the last page of this paperwork. If you have not heard from us regarding the results in 2 weeks, please contact this office.     Osteoporosis  Osteoporosis happens when your bones get thin and weak. This can cause your bones to break (fracture) more easily. You can do things at home to make your bones stronger. Follow these instructions at home:  Activity  Exercise as told by your doctor. Ask your doctor what activities are safe for you. You should do: ? Exercises that make your muscles work to hold your body weight up (weight-bearing exercises). These include tai chi, yoga, and walking. ? Exercises to make your muscles stronger. One example is lifting weights. Lifestyle  Limit alcohol intake to no more than 1 drink a day for nonpregnant women and 2 drinks a day for men. One drink equals 12 oz of beer, 5 oz of wine, or 1 oz of hard liquor.  Do not use any products that have nicotine or tobacco in them. These include cigarettes and e-cigarettes. If you need help quitting, ask your doctor. Preventing falls  Use tools to help you move  around (mobility aids) as needed. These include canes, walkers, scooters, and crutches.  Keep rooms well-lit and free of clutter.  Put away things that could make you trip. These include cords and rugs.  Install safety rails on stairs. Install grab bars in bathrooms.  Use rubber mats in slippery areas, like bathrooms.  Wear shoes that: ? Fit you well. ? Support your feet. ? Have closed toes. ? Have rubber soles or low heels.  Tell your doctor about all of the medicines you are taking. Some medicines can make you more likely to fall. General instructions  Eat plenty of calcium and vitamin D. These nutrients are good for your bones. Good sources of calcium and vitamin D include: ? Some fatty fish, such as salmon and tuna. ? Foods that have calcium and vitamin D added to them (fortified foods). For example, some breakfast cereals are fortified with calcium and vitamin D. ? Egg yolks. ? Cheese. ? Liver.  Take over-the-counter and prescription medicines only as told by your doctor.  Keep all follow-up visits as told by your doctor. This is important. Contact a doctor if:  You have not been tested (screened) for osteoporosis and you are: ? A woman who is age 65 or older. ? A man who is age 70 or older. Get help right away if:  You fall.  You get hurt. Summary  Osteoporosis happens when your bones get thin and weak.  Weak bones can   break (fracture) more easily.  Eat plenty of calcium and vitamin D. These nutrients are good for your bones.  Tell your doctor about all of the medicines that you take. This information is not intended to replace advice given to you by your health care provider. Make sure you discuss any questions you have with your health care provider. Document Revised: 06/12/2017 Document Reviewed: 04/24/2017 Elsevier Patient Education  2020 [employer].

## 2020-03-15 NOTE — Progress Notes (Signed)
9/2/202111:48 AM  Carmen Anderson [birth year], 59 y.o., female [id number]  Chief Complaint  Patient presents with  . Transitions Of Care    dm, hypotention    HPI:   Patient is a 59 y.o. female with past medical history significant for DM2 with neuropathy, hypothyroidism, HLP, osteoporosis, vitamin d deficiency, CAD  who presents today for transfer of care  Previous PCP Dr Nolon Rod Endo Dr Cruzita Lederer Cards Dr Claiborne Billings  She is overall doing well and has no acute concerns She is taking her meds as rx Did not tolerate fosamax - horrible diarrhea after each dose She is not taking calcium and vitamin D She just had eye exam, NO retinopathy, note in media, [facility], Dr My Marin Comment, [phone] Has had covid vaccines Requesting flu vaccine   Depression screen [facility] 2/9 03/15/2020 10/19/2019 10/07/2019  Decreased Interest 0 0 0  Down, Depressed, Hopeless 0 0 0  PHQ - 2 Score 0 0 0    Fall Risk  03/15/2020 10/19/2019 10/07/2019 12/16/2018 09/21/2018  Falls in the past year? 0 0 0 0 0  Number falls in past yr: 0 0 0 0 0  Injury with Fall? 0 0 0 0 0  Follow up - Falls evaluation completed Falls evaluation completed Falls evaluation completed Falls evaluation completed     Allergies  Allergen Reactions  . Aspirin Other (See Comments)    Burns stomach  . Codeine Rash    Prior to Admission medications   Medication Sig Start Date End Date Taking? Authorizing Provider  aspirin EC 81 MG EC tablet Take 1 tablet (81 mg total) by mouth daily. 09/09/17  Yes Almyra Deforest, PA  atorvastatin (LIPITOR) 80 MG tablet Take 80 mg by mouth daily.   Yes [provider]  clopidogrel (PLAVIX) 75 MG tablet Take 1 tablet (75 mg total) by mouth daily. 12/28/19  Yes Duke, Tami Lin, PA  empagliflozin (JARDIANCE) 10 MG TABS tablet Take 10 mg by mouth daily. 09/22/19  Yes Philemon Kingdom, MD  fluticasone (FLONASE) 50 MCG/ACT nasal spray Place 2 sprays into both nostrils daily. 01/11/18  Yes Stallings, Zoe A, MD   gabapentin (NEURONTIN) 100 MG capsule Take 1 capsule (100 mg total) by mouth at bedtime. 10/19/19  Yes Forrest Moron, MD  levothyroxine (SYNTHROID) 50 MCG tablet Take 1 tablet (50 mcg total) by mouth daily. 10/19/19  Yes Stallings, Zoe A, MD  metFORMIN (GLUCOPHAGE-XR) 500 MG 24 hr tablet TAKE 1 TABLET BY MOUTH TWICE DAILY WITH MEALS AS DIRECTED 10/31/19  Yes Philemon Kingdom, MD  nitroGLYCERIN (NITROSTAT) 0.4 MG SL tablet Place 1 tablet (0.4 mg total) under the tongue every 5 (five) minutes x 3 doses as needed for chest pain. 08/08/19  Yes Troy Sine, MD    Past Medical History:  Diagnosis Date  . DM type 2 (diabetes mellitus, type 2) ([place])   . History of hiatal hernia   . Hyperlipidemia   . Hypertension   . Hypothyroidism     Past Surgical History:  Procedure Laterality Date  . ABDOMINAL HYSTERECTOMY  1995  . CARDIAC CATHETERIZATION    . CORONARY STENT INTERVENTION N/A 09/07/2017   Procedure: CORONARY STENT INTERVENTION;  Surgeon: Lorretta Harp, MD;  Location: [facility];  Service: Cardiovascular;  Laterality: N/A;  . LEFT HEART CATH AND CORONARY ANGIOGRAPHY N/A 09/07/2017   Procedure: LEFT HEART CATH AND CORONARY ANGIOGRAPHY;  Surgeon: Lorretta Harp, MD;  Location: [facility];  Service: Cardiovascular;  Laterality: N/A;  . STAPEDECTOMY Right ~ 2002    Social History   Tobacco Use  . Smoking status: Former Smoker    Packs/day: 0.50    Years: 27.00    Pack years: 13.50    Types: Cigarettes    Quit date: 09/05/2017    Years since quitting: 2.5  . Smokeless tobacco: Never Used  Substance Use Topics  . Alcohol use: No    Family History  Problem Relation Age of Onset  . Diabetes Mother   . Heart disease Mother   . Stroke Father        2011  . Hyperlipidemia Father   . Diabetes Sister   . Heart disease Sister   . Heart disease Brother        congenital    Review of Systems  Constitutional: Negative for chills, fever and malaise/fatigue.   Respiratory: Negative for cough and shortness of breath.   Cardiovascular: Negative for chest pain, palpitations and leg swelling.  Gastrointestinal: Negative for abdominal pain, blood in stool, melena, nausea and vomiting.  Neurological: Negative for dizziness.  per hpi   OBJECTIVE:  Today's Vitals   03/15/20 1146  BP: (!) 101/47  Pulse: 65  Temp: 98 F (36.7 C)  SpO2: 98%  Weight: 114 lb (51.7 kg)  Height: 5' 3.5" (1.613 m)   Body mass index is 19.88 kg/m.   Physical Exam Vitals and nursing note reviewed.  Constitutional:      Appearance: She is well-developed.  HENT:     Head: Normocephalic and atraumatic.     Mouth/Throat:     Pharynx: No oropharyngeal exudate.  Eyes:     General: No scleral icterus.    Extraocular Movements: Extraocular movements intact.     Conjunctiva/sclera: Conjunctivae normal.     Pupils: Pupils are equal, round, and reactive to light.  Cardiovascular:     Rate and Rhythm: Normal rate and regular rhythm.     Heart sounds: Normal heart sounds. No murmur heard.  No friction rub. No gallop.   Pulmonary:     Effort: Pulmonary effort is normal.     Breath sounds: Normal breath sounds. No wheezing, rhonchi or rales.  Musculoskeletal:     Cervical back: Neck supple.     Right lower leg: No edema.     Left lower leg: No edema.  Skin:    General: Skin is warm and dry.  Neurological:     Mental Status: She is alert and oriented to person, place, and time.     No results found for this or any previous visit (from the past 24 hour(s)).  No results found.   ASSESSMENT and PLAN  1. Diabetes mellitus with cardiac complication ([place]) Managed by endo - CMP14+EGFR - Microalbumin / creatinine urine ratio  2. Mixed hyperlipidemia Checking labs today, medications will be adjusted as needed.  LDL goal < 70 given CAD - Lipid panel - CMP14+EGFR  3. Hypothyroidism due to acquired atrophy of thyroid Checking labs today, medications will be  adjusted as needed.  - TSH  4. Colon cancer screening - Cologuard  5. Vitamin D deficiency - VITAMIN D 25 Hydroxy (Vit-D Deficiency, Fractures) - discussed starting OTC, might need high dose supplementation  Trial of monthly bisphophonate for osteoporosis. Reviewed r/se/b  6. Need for influenza vaccination  Other orders - Flu Vaccine QUAD 36+ mos IM - fluticasone (FLONASE) 50 MCG/ACT nasal spray; Place 2 sprays into both nostrils daily. - risedronate (ACTONEL) 150 MG tablet;  Take 1 tablet (150 mg total) by mouth every 30 (thirty) days. with water on empty stomach, nothing by mouth or lie down for next 30 minutes.  Return in about 6 months (around 09/12/2020).    Rutherford Guys, MD Primary [facility] at [facility] [place], [place] [address] Ph.  [phone] Fax [phone]

## 2020-03-16 LAB — CMP14+EGFR
ALT: 26 IU/L (ref 0–32)
AST: 20 IU/L (ref 0–40)
Albumin/Globulin Ratio: 2.1 (ref 1.2–2.2)
Albumin: 4.6 g/dL (ref 3.8–4.9)
Alkaline Phosphatase: 65 IU/L (ref 48–121)
BUN/Creatinine Ratio: 15 (ref 9–23)
BUN: 11 mg/dL (ref 6–24)
Bilirubin Total: 0.2 mg/dL (ref 0.0–1.2)
CO2: 22 mmol/L (ref 20–29)
Calcium: 9.8 mg/dL (ref 8.7–10.2)
Chloride: 106 mmol/L (ref 96–106)
Creatinine, Ser: 0.73 mg/dL (ref 0.57–1.00)
GFR calc Af Amer: 105 mL/min/{1.73_m2} (ref >59)
GFR calc non Af Amer: 91 mL/min/{1.73_m2} (ref >59)
Globulin, Total: 2.2 g/dL (ref 1.5–4.5)
Glucose: 109 mg/dL — ABNORMAL HIGH (ref 65–99)
Potassium: 4.3 mmol/L (ref 3.5–5.2)
Sodium: 141 mmol/L (ref 134–144)
Total Protein: 6.8 g/dL (ref 6.0–8.5)

## 2020-03-16 LAB — LIPID PANEL
Chol/HDL Ratio: 3 ratio (ref 0.0–4.4)
Cholesterol, Total: 114 mg/dL (ref 100–199)
HDL: 38 mg/dL — ABNORMAL LOW (ref >39)
LDL Chol Calc (NIH): 56 mg/dL (ref 0–99)
Triglycerides: 107 mg/dL (ref 0–149)
VLDL Cholesterol Cal: 20 mg/dL (ref 5–40)

## 2020-03-16 LAB — VITAMIN D 25 HYDROXY (VIT D DEFICIENCY, FRACTURES): Vit D, 25-Hydroxy: 24.4 ng/mL — ABNORMAL LOW (ref 30.0–100.0)

## 2020-03-16 LAB — TSH: TSH: 1.47 u[IU]/mL (ref 0.450–4.500)

## 2020-03-29 DIAGNOSIS — Z20828 Contact with and (suspected) exposure to other viral communicable diseases: Secondary | ICD-10-CM | POA: Diagnosis not present

## 2020-05-06 ENCOUNTER — Other Ambulatory Visit: Payer: Self-pay | Admitting: Cardiovascular Disease

## 2020-05-07 ENCOUNTER — Other Ambulatory Visit: Payer: Self-pay

## 2020-05-07 MED ORDER — ATORVASTATIN CALCIUM 80 MG PO TABS: 80.0000 mg | ORAL_TABLET | Freq: Every day | ORAL | 0 refills | Status: DC

## 2020-05-10 DIAGNOSIS — Z1211 Encounter for screening for malignant neoplasm of colon: Secondary | ICD-10-CM | POA: Diagnosis not present

## 2020-05-23 LAB — EXTERNAL GENERIC LAB PROCEDURE: COLOGUARD: NEGATIVE

## 2020-05-23 LAB — COLOGUARD: COLOGUARD: NEGATIVE

## 2020-05-25 LAB — COLOGUARD: Cologuard: NEGATIVE

## 2020-06-04 ENCOUNTER — Other Ambulatory Visit: Payer: Self-pay

## 2020-06-04 ENCOUNTER — Encounter: Payer: Self-pay | Admitting: Internal Medicine

## 2020-06-04 ENCOUNTER — Ambulatory Visit: Payer: BC Managed Care – PPO | Admitting: Internal Medicine

## 2020-06-04 VITALS — BP 140/82 | HR 52 | Ht 63.0 in | Wt 114.2 lb

## 2020-06-04 DIAGNOSIS — E785 Hyperlipidemia, unspecified: Secondary | ICD-10-CM | POA: Diagnosis not present

## 2020-06-04 DIAGNOSIS — E1142 Type 2 diabetes mellitus with diabetic polyneuropathy: Secondary | ICD-10-CM | POA: Diagnosis not present

## 2020-06-04 DIAGNOSIS — E1140 Type 2 diabetes mellitus with diabetic neuropathy, unspecified: Secondary | ICD-10-CM

## 2020-06-04 LAB — POCT GLYCOSYLATED HEMOGLOBIN (HGB A1C): Hemoglobin A1C: 7.1 % — AB (ref 4.0–5.6)

## 2020-06-04 NOTE — Patient Instructions (Signed)
Please continue: - Metformin ER 500 mg 2x a day with meals - Jardiance 10 mg before breakfast  Please return in 4 months with your sugar log.

## 2020-06-04 NOTE — Progress Notes (Signed)
Patient ID: Carmen Anderson, female   DOB: [birth year], 60 y.o.   MRN: [id number]   This visit occurred during the SARS-CoV-2 public health emergency.  Safety protocols were in place, including screening questions prior to the visit, additional usage of staff PPE, and extensive cleaning of exam room while observing appropriate contact time as indicated for disinfecting solutions.   HPI: Carmen Anderson is a 59 y.o.-year-old female, initially referred by her PCP, Olean Ree, NP, returning for follow-up for DM2, dx in 2001-2002, non-insulin-dependent, now more controlled, with complications (CAD - s/p NSTEMI 2019, s/p stent; PN). Last visit 4 months ago.  Reviewed HbA1c levels: Lab Results  Component Value Date   HGBA1C 7.0 (A) 01/27/2020   HGBA1C 7.4 (A) 09/22/2019   HGBA1C 6.4 (A) 05/18/2019   HGBA1C 7.6 (A) 12/15/2018   HGBA1C 6.7 (A) 08/16/2018   HGBA1C 6.9 (A) 04/13/2018   HGBA1C 6.6 (A) 12/14/2017   HGBA1C 6.9 (H) 09/06/2017   HGBA1C 7 08/14/2017   HGBA1C 6.8 04/13/2017   HGBA1C 6.7 11/25/2016   HGBA1C 7.0 06/03/2016   HGBA1C 7.0 03/04/2016   HGBA1C 7.0 12/03/2015   HGBA1C 7.9 (H) 09/05/2015   HGBA1C 7.7 (H) 05/09/2015   HGBA1C 7.0 10/27/2014   HGBA1C 6.7 04/28/2014   HGBA1C 7.0 12/14/2013   HGBA1C 6.8 08/03/2013   Pt is on a regimen of: - Metformin ER 500 mg once a day (decreased by PCP in 09/2018) >> increase back to twice a day 12/2018 - Januvia 100 mg daily in am >> Jardiance 10 mg before breakfast  Pt checks her sugars twice a day per review of her excellent log: - am: 95-120, 127 >> 79-121, 127 >> 76-122 >> 81-120 - 2h after b'fast: 130-167, 177 >> 143-160, 170 >> 124, 131-163, 182 - before lunch: 79-119 >> 79-98, 118 >> 79-101, 121 - 2h after lunch: 150-169 >> 145-171 >> 140-171 - before dinner: 92-119, 127 >> 84-119 >> 79-111 - 2h after dinner: 146, 154-177 >> 141-160 >> 141-169, 180 - bedtime:10 97-125, 159 >> 89-123 >> 92-115 >> 91-122 - nighttime: n/c >>  n/c Lowest sugar was 38 ... >> 79 >> 76 >> 74 ; she has hypoglycemia awareness in the 50s. Highestin: 177 >> 187 >> 190.  Glucometer: FreeStyle >> [facility]  Pt's meals are: - snack:  1/2 yoghurt, PB + bread - Breakfast: wheat waffle + sugar free syrup or wheat muffin w/ 1 egg  - Lunch: salads or [place] sandwich on wheat bread - Dinner: soups, salad, meat 2x a weekly (chicken, salmon) + veggies - Snacks: pineapple, celery Her sister in law does the cooking for her.  She continues to exercise 4-5 times a weeks on the treadmill.  -No CKD, last BUN/creatinine:  Lab Results  Component Value Date   BUN 11 03/15/2020   CREATININE 0.73 03/15/2020  On lisinopril 2.5. -+ HL; last set of lipids: Lab Results  Component Value Date   CHOL 114 03/15/2020   HDL 38 (L) 03/15/2020   LDLCALC 56 03/15/2020   TRIG 107 03/15/2020   CHOLHDL 3.0 03/15/2020  On Lipitor 80, fish oil. - last eye exam was 01/28/2020: No DR; [facility].  - + Numbness and tingling and occasional burning in her feet.  On low-dose Neurontin and B complex. These help.  She had a NSTEMI in 08/2017.  At that time, she had a catheterization and had 90% blockage on LAD.  She had a stent placed.   She has HTN and  controlled hypothyroidism.  Latest TSH was normal: Lab Results  Component Value Date   TSH 1.470 03/15/2020   ROS: Constitutional: no weight gain/no weight loss, no fatigue, no subjective hyperthermia, no subjective hypothermia Eyes: no blurry vision, no xerophthalmia ENT: no sore throat, no nodules palpated in neck, no dysphagia, no odynophagia, no hoarseness Cardiovascular: no CP/no SOB/no palpitations/no leg swelling Respiratory: no cough/no SOB/no wheezing Gastrointestinal: no N/no V/no D/no C/no acid reflux Musculoskeletal: no muscle aches/no joint aches Skin: no rashes, no hair loss Neurological: no tremors/+ numbness/+ tingling/no dizziness  I reviewed pt's medications, allergies, PMH, social hx,  family hx, and changes were documented in the history of present illness. Otherwise, unchanged from my initial visit note.  Past Medical History:  Diagnosis Date  . DM type 2 (diabetes mellitus, type 2) ([facility])   . History of hiatal hernia   . Hyperlipidemia   . Hypertension   . Hypothyroidism    Past Surgical History:  Procedure Laterality Date  . ABDOMINAL HYSTERECTOMY  1995  . CARDIAC CATHETERIZATION    . CORONARY STENT INTERVENTION N/A 09/07/2017   Procedure: CORONARY STENT INTERVENTION;  Surgeon: Runell Gess, MD;  [facility]: [facility];  Service: Cardiovascular;  Laterality: N/A;  . LEFT HEART CATH AND CORONARY ANGIOGRAPHY N/A 09/07/2017   Procedure: LEFT HEART CATH AND CORONARY ANGIOGRAPHY;  Surgeon: Runell Gess, MD;  Location: [facility];  Service: Cardiovascular;  Laterality: N/A;  . STAPEDECTOMY Right ~ 2002   Social History   Social History  . Marital Status: Married    Spouse Name: N/A  . Number of Children: 0  . Years of Education: 12   Occupational History  . property asst. Production designer, theatre/television/film    Social History Main Topics  . Smoking status: Current Every Day Smoker -- 0.50 packs/day for 10 years    Types: Cigarettes  . Smokeless tobacco: Not on file     Comment: pt states thinking of quitting  . Alcohol Use: No  . Drug Use: No   Social History Narrative   Exercise: walking, 4 times/week for 30 minutes.   Current Outpatient Medications on File Prior to Visit  Medication Sig Dispense Refill  . aspirin EC 81 MG EC tablet Take 1 tablet (81 mg total) by mouth daily.    [name] atorvastatin (LIPITOR) 80 MG tablet Take 1 tablet (80 mg total) by mouth daily. 90 tablet 0  . clopidogrel (PLAVIX) 75 MG tablet Take 1 tablet (75 mg total) by mouth daily. 90 tablet 1  . empagliflozin (JARDIANCE) 10 MG TABS tablet Take 10 mg by mouth daily. 90 mg 3  . fluticasone (FLONASE) 50 MCG/ACT nasal spray Place 2 sprays into both nostrils daily. 16 g 6  . gabapentin  (NEURONTIN) 100 MG capsule Take 1 capsule (100 mg total) by mouth at bedtime. 90 capsule 3  . levothyroxine (SYNTHROID) 50 MCG tablet Take 1 tablet (50 mcg total) by mouth daily. 90 tablet 3  . metFORMIN (GLUCOPHAGE-XR) 500 MG 24 hr tablet TAKE 1 TABLET BY MOUTH TWICE DAILY WITH MEALS AS DIRECTED 180 tablet 3  . nitroGLYCERIN (NITROSTAT) 0.4 MG SL tablet Place 1 tablet (0.4 mg total) under the tongue every 5 (five) minutes x 3 doses as needed for chest pain. 25 tablet 3  . risedronate (ACTONEL) 150 MG tablet Take 1 tablet (150 mg total) by mouth every 30 (thirty) days. with water on empty stomach, nothing by mouth or lie down for next 30 minutes. 1 tablet  11   No current facility-administered medications on file prior to visit.   Allergies  Allergen Reactions  . Aspirin Other (See Comments)    Burns stomach  . Codeine Rash   Family History  Problem Relation Age of Onset  . Diabetes Mother   . Heart disease Mother   . Stroke Father        2011  . Hyperlipidemia Father   . Diabetes Sister   . Heart disease Sister   . Heart disease Brother        congenital   PE: BP 140/82   Pulse (!) 52   Ht 5\' 3"  (1.6 m)   Wt 114 lb 3.2 oz (51.8 kg)   SpO2 99%   BMI 20.23 kg/m  Body mass index is 20.23 kg/m. Wt Readings from Last 3 Encounters:  06/04/20 114 lb 3.2 oz (51.8 kg)  03/15/20 114 lb (51.7 kg)  01/27/20 114 lb (51.7 kg)   Constitutional: normal weight, in NAD Eyes: PERRLA, EOMI, no exophthalmos ENT: moist mucous membranes, no thyromegaly, no cervical lymphadenopathy Cardiovascular: RRR, No MRG Respiratory: CTA B Gastrointestinal: abdomen soft, NT, ND, BS+ Musculoskeletal: no deformities, strength intact in all 4 Skin: moist, warm, no rashes Neurological: no tremor with outstretched hands, DTR normal in all 4  ASSESSMENT: 1. DM2, non-insulin-dependent, uncontrolled, with complications - CAD, s/p NSTEMI 2019, s/p stent - PN   2. PN   3. HL  PLAN:  1. Patient with  history of longstanding, uncontrolled, type 2 diabetes, with improved control after her NSTEMI + stent placement in 08/2017.  We started Jardiance after this, mostly for its cardiovascular benefits.  She has no side effects from the medication.  She continues exercising.  At last visit, sugars were improved, at goal at all times of the day so we did not change her regimen.  She had moved her dinner earlier and was having a small snack at bedtime, but without increased blood sugars in the morning so we continued the same practice.  HbA1c was 7.0%, improved. -At today's visit, sugars are all at goal at all times of the day.  His usual, she is doing great job checking her sugars twice a day, alternating check times and documenting them in her log.  She very rarely has blood sugars higher than 170s and these are usually after meals.  I do not feel we absolutely need to change her regimen for now. - I suggested to:  Patient Instructions  Please continue: - Metformin ER 500 mg 2x a day with meals - Jardiance 10 mg before breakfast  Please return in 4 months with your sugar log.  - we checked her HbA1c: 7.1% (slightly higher) - advised to check sugars at different times of the day - 1x a day, rotating check times - advised for yearly eye exams >> she is UTD - return to clinic in 4 months    2. Diabetic PN -No new complaints -She is on low-dose Neurontin 100 mg daily and B complex  3. HL -Reviewed latest lipid panel from 03/2020: Excellent fractions (improved LDL) with exception of a slightly low HDL: Lab Results  Component Value Date   CHOL 114 03/15/2020   HDL 38 (L) 03/15/2020   LDLCALC 56 03/15/2020   TRIG 107 03/15/2020   CHOLHDL 3.0 03/15/2020  -Continues fish oil and Lipitor 80 without side effects  05/15/2020, MD PhD [facility]

## 2020-06-04 NOTE — Addendum Note (Signed)
Addended by: Kenyon Ana on: 06/04/2020 09:15 AM   Modules accepted: Orders

## 2020-06-18 ENCOUNTER — Other Ambulatory Visit: Payer: Self-pay

## 2020-06-18 ENCOUNTER — Encounter: Payer: Self-pay | Admitting: Family Medicine

## 2020-06-18 ENCOUNTER — Encounter: Payer: BC Managed Care – PPO | Admitting: Family Medicine

## 2020-06-18 NOTE — Patient Instructions (Signed)
° ° ° °  If you have lab work done today you will be contacted with your lab results within the next 2 weeks.  If you have not heard from us then please contact us. The fastest way to get your results is to register for My Chart. ° ° °IF you received an x-ray today, you will receive an invoice from [facility]. Please contact [facility] at [phone] with questions or concerns regarding your invoice.  ° °IF you received labwork today, you will receive an invoice from [facility]. Please contact [facility] at [phone] with questions or concerns regarding your invoice.  ° °Our billing staff will not be able to assist you with questions regarding bills from these companies. ° °You will be contacted with the lab results as soon as they are available. The fastest way to get your results is to activate your My Chart account. Instructions are located on the last page of this paperwork. If you have not heard from us regarding the results in 2 weeks, please contact this office. °  ° ° ° °

## 2020-07-12 ENCOUNTER — Other Ambulatory Visit: Payer: Self-pay | Admitting: Cardiovascular Disease

## 2020-07-19 ENCOUNTER — Telehealth: Payer: Self-pay | Admitting: Cardiovascular Disease

## 2020-07-19 DIAGNOSIS — I214 Non-ST elevation (NSTEMI) myocardial infarction: Secondary | ICD-10-CM

## 2020-07-19 NOTE — Telephone Encounter (Signed)
*  STAT* If patient is at the pharmacy, call can be transferred to refill team.   1. Which medications need to be refilled? (please list name of each medication and dose if known) clopidogrel (PLAVIX) 75 MG tablet  2. Which pharmacy/location (including street and city if local pharmacy) is medication to be sent to? [id number] - [place], [place] - [address]  3. Do they need a 30 day or 90 day supply? 90 day   Patient is out of medication  

## 2020-07-20 MED ORDER — CLOPIDOGREL BISULFATE 75 MG PO TABS: 75.0000 mg | ORAL_TABLET | Freq: Every day | ORAL | 1 refills | Status: DC

## 2020-07-26 ENCOUNTER — Other Ambulatory Visit: Payer: Self-pay

## 2020-08-14 ENCOUNTER — Other Ambulatory Visit: Payer: Self-pay | Admitting: Family Medicine

## 2020-08-14 DIAGNOSIS — Z1231 Encounter for screening mammogram for malignant neoplasm of breast: Secondary | ICD-10-CM

## 2020-09-11 ENCOUNTER — Ambulatory Visit: Payer: BC Managed Care – PPO | Admitting: Cardiovascular Disease

## 2020-09-11 ENCOUNTER — Other Ambulatory Visit: Payer: Self-pay

## 2020-09-11 ENCOUNTER — Encounter: Payer: Self-pay | Admitting: Cardiovascular Disease

## 2020-09-11 DIAGNOSIS — E785 Hyperlipidemia, unspecified: Secondary | ICD-10-CM | POA: Diagnosis not present

## 2020-09-11 DIAGNOSIS — E1159 Type 2 diabetes mellitus with other circulatory complications: Secondary | ICD-10-CM

## 2020-09-11 DIAGNOSIS — I214 Non-ST elevation (NSTEMI) myocardial infarction: Secondary | ICD-10-CM

## 2020-09-11 DIAGNOSIS — I10 Essential (primary) hypertension: Secondary | ICD-10-CM

## 2020-09-11 NOTE — Patient Instructions (Signed)

## 2020-09-11 NOTE — Progress Notes (Signed)
Cardiology Office Note    Date:  09/18/2020   ID:  Carmen Anderson, DOB 1960/12/26, MRN [id number]  PCP:  Jacelyn Pi, Lilia Argue, MD  Cardiologist:  Shelva Majestic, MD   No chief complaint on file.   History of Present Illness:  Carmen Anderson is a 60 y.o. female who has a history of hyperlipidemia, diabetes mellitus, hypothyroidism who suffered a non-ST segment elevation MI.  She was last seen by me in the office in January 2021.  She presents for 60-monthfollow-up evaluation  Ms. CEllerbrockwas hospitalized on September 04, 2017 after developing chest pain with left arm radiation associated with shortness of breath.  I had seen her for cardiology consultation during her evaluation.  She had smoked for approximately 10 years, quit for 5 years, and then had been smoking for another 10 years.  She ultimately ruled in for non-ST segment MI.  A nuclear study showed a medium defect of moderate severity in the mid anteroseptal apical anterior apical septal location.  An echo Doppler study showed an EF of 60 to 65%.  She underwent cardiac catheterization of fiber 25 2019 which showed a 90% proximal to mid LAD stenosis successfully treated with DES stenting by Dr. BGwenlyn Foundand a 2.25 x 16 mm Synergy stent was inserted.  Subsequently, she stabilized.  Initial LDL cholesterol was notable for 191.  She was on atorvastatin 80 mg daily for hyperlipidemia and 3 weeks ago repeat laboratory showed an LDL cholesterol improved to 52.  She is diabetic on Jardiance 10 mg in addition to metformin.  She has been on aspirin and Brilinta for dual antiplatelet therapy.  She is on lisinopril 5 mg daily.  She has hypothyroidism on levothyroxine at 50 mcg.  She is on Jardiance in addition to metformin for diabetes mellitus.  When I saw her in May 2019 she was doing well and was about to start cardiac rehab.   I last saw her in December 2019 at which time she denied any recurrent episodes of chest pain.  There was resolution of her  prior exertional dyspnea.  She was working at [employer]  She denied palpitations .  She had mild orthostatic hypotension and I reduced her lisinopril down to 2.5 mg.  She was  evaluated by AFabian Sharp PA in a telemedicine visit in June 2020.  She was not having anginal symptoms.  Her Brilinta was discontinued due to cost and she was switched to clopidogrel.  She was on Jardiance and Metformin for her diabetes mellitus followed by Dr. GCruzita Lederer  She continued to be on atorvastatin for hyperlipidemia.  I last saw her on August 08, 2019.  Since her prior evaluation she had continued to do well.  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  She denies chest pain PND orthopnea or palpitations.  She continue be on aspirin and Plavix for CAD in addition to Jardiance and Metformin for diabetes mellitus.  She was on levothyroxine for hypothyroidism.  Since  last saw her, she has remained stable.  She is changing her primary care physician and will be establishing care with Dr. KHorald Pollen  She denies chest pain, shortness of breath, palpitations, presyncope or syncope.  Laboratory in September 2021 showed an LDL cholesterol at 56.  Hemoglobin A1c was increased at 7.1.  Renal function was normal with a creatinine of 0.73.  She presents for reevaluation.   Past Medical History:  Diagnosis Date  .  DM type 2 (diabetes mellitus, type 2) ([place])   . History of hiatal hernia   . Hyperlipidemia   . Hypertension   . Hypothyroidism     Past Surgical History:  Procedure Laterality Date  . ABDOMINAL HYSTERECTOMY  1995  . CARDIAC CATHETERIZATION    . CORONARY STENT INTERVENTION N/A 09/07/2017   Procedure: CORONARY STENT INTERVENTION;  Surgeon: Lorretta Harp, MD;  Location: [facility];  Service: Cardiovascular;  Laterality: N/A;  . LEFT HEART CATH AND CORONARY ANGIOGRAPHY N/A 09/07/2017   Procedure: LEFT HEART CATH AND CORONARY ANGIOGRAPHY;  Surgeon: Lorretta Harp, MD;  Location: [facility];  Service: Cardiovascular;  Laterality: N/A;  . STAPEDECTOMY Right ~ 2002    Current Medications: Outpatient Medications Prior to Visit  Medication Sig Dispense Refill  . aspirin EC 81 MG EC tablet Take 1 tablet (81 mg total) by mouth daily.    [name] atorvastatin (LIPITOR) 80 MG tablet TAKE 1 TABLET BY MOUTH EVERY DAY 90 tablet 0  . empagliflozin (JARDIANCE) 10 MG TABS tablet Take 10 mg by mouth daily. 90 mg 3  . fluticasone (FLONASE) 50 MCG/ACT nasal spray Place 2 sprays into both nostrils daily. 16 g 6  . gabapentin (NEURONTIN) 100 MG capsule Take 1 capsule (100 mg total) by mouth at bedtime. 90 capsule 3  . levothyroxine (SYNTHROID) 50 MCG tablet Take 1 tablet (50 mcg total) by mouth daily. 90 tablet 3  . metFORMIN (GLUCOPHAGE-XR) 500 MG 24 hr tablet TAKE 1 TABLET BY MOUTH TWICE DAILY WITH MEALS AS DIRECTED 180 tablet 3  . nitroGLYCERIN (NITROSTAT) 0.4 MG SL tablet Place 1 tablet (0.4 mg total) under the tongue every 5 (five) minutes x 3 doses as needed for chest pain. 25 tablet 3  . clopidogrel (PLAVIX) 75 MG tablet Take 1 tablet (75 mg total) by mouth daily. 90 tablet 1  . risedronate (ACTONEL) 150 MG tablet Take 1 tablet (150 mg total) by mouth every 30 (thirty) days. with water on empty stomach, nothing by mouth or lie down for next 30 minutes. 1 tablet 11   No facility-administered medications prior to visit.     Allergies:   Aspirin and Codeine   Social History   Socioeconomic History  . Marital status: Married    Spouse name: Not on file  . Number of children: Not on file  . Years of education: 34  . Highest education level: Not on file  Occupational History  . 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 Smoking status: Former Smoker    Packs/day: 0.50    Years: 27.00    Pack years: 13.50    Types: Cigarettes    Quit date: 09/05/2017    Years since quitting: 3.0  . Smokeless  tobacco: Never Used  Vaping Use  . Vaping Use: Never used  Substance and Sexual Activity  . Alcohol use: No  . Drug use: No  . Sexual activity: Yes    Birth control/protection: None  Other Topics Concern  . Not on file  Social History Narrative   Exercise: walking, 4 times/week for 30 minutes.   Social Determinants of Health   Financial Resource Strain: Not on file  Food Insecurity: Not on file  Transportation Needs: Not on file  Physical Activity: Not on file  Stress: Not on file  Social Connections: Not on file    Quit tobacco on September 04, 2017  Family History:  The patient's family history includes Diabetes in her mother and sister; Heart disease in her brother, mother, and sister; Hyperlipidemia in her father; Stroke in her father.   ROS General: Negative; No fevers, chills, or night sweats;  HEENT: Negative; No changes in vision or hearing, sinus congestion, difficulty swallowing Pulmonary: Negative; No cough, wheezing, shortness of breath, hemoptysis Cardiovascular: See HPI GI: Negative; No nausea, vomiting, diarrhea, or abdominal pain GU: Negative; No dysuria, hematuria, or difficulty voiding Musculoskeletal: Negative; no myalgias, joint pain, or weakness Hematologic/Oncology: Negative; no easy bruising, bleeding Endocrine: Negative; no heat/cold intolerance; no diabetes Neuro: Negative; no changes in balance, headaches Skin: Negative; No rashes or skin lesions Psychiatric: Negative; No behavioral problems, depression Sleep: Negative; No snoring, daytime sleepiness, hypersomnolence, bruxism, restless legs, hypnogognic hallucinations, no cataplexy Other comprehensive 14 point system review is negative.   PHYSICAL EXAM:   VS:  BP (!) 118/58   Pulse (!) 57   Ht 5' 3" (1.6 m)   Wt 115 lb 12.8 oz (52.5 kg)   BMI 20.51 kg/m     Blood pressure by me was 114/60  Wt Readings from Last 3 Encounters:  09/11/20 115 lb 12.8 oz (52.5 kg)  06/18/20 114 lb (51.7 kg)   06/04/20 114 lb 3.2 oz (51.8 kg)      Physical Exam BP (!) 118/58   Pulse (!) 57   Ht 5' 3" (1.6 m)   Wt 115 lb 12.8 oz (52.5 kg)   BMI 20.51 kg/m  General: Alert, oriented, no distress.  Skin: normal turgor, no rashes, warm and dry HEENT: Normocephalic, atraumatic. Pupils equal round and reactive to light; sclera anicteric; extraocular muscles intact; Nose without nasal septal hypertrophy Mouth/Parynx benign;  Neck: No JVD, no carotid bruits; normal carotid upstroke Lungs: clear to ausculatation and percussion; no wheezing or rales Chest wall: without tenderness to palpitation Heart: PMI not displaced, RRR, s1 s2 normal, 1/6 systolic murmur, no diastolic murmur, no rubs, gallops, thrills, or heaves Abdomen: soft, nontender; no hepatosplenomehaly, BS+; abdominal aorta nontender and not dilated by palpation. Back: no CVA tenderness Pulses 2+ Musculoskeletal: full range of motion, normal strength, no joint deformities Extremities: no clubbing cyanosis or edema, Homan's sign negative  Neurologic: grossly nonfocal; Cranial nerves grossly wnl Psychologic: Normal mood and affect   Studies/Labs Reviewed:   EKG:  EKG is ordered today.  ECG (independently read by me): Sinus bradycardia at 57, no ectopy  January 2021 ECG (independently read by me): Sinus rhythm at 63 bpm.  Normal intervals.  No ectopy.  No significant ST-T changes    December 2019 ECG (independently read by me): Sinus rhythm at 60 bpm.  No ectopy.  Normal intervals.  Nov 16, 2017 ECG (independently read by me): Sinus bradycardia 55 bpm.  No ST segment changes.  No ectopy.  Recent Labs: BMP Latest Ref Rng & Units 03/15/2020 10/17/2019 09/21/2018  Glucose 65 - 99 mg/dL 109(H) 135(H) 130(H)  BUN 6 - 24 mg/dL _0 Creatinine 0.57 - 1.00 mg/dL 0.73 0.76 0.85  BUN/Creat Ratio 9 - _1 Sodium 134 - 144 mmol/L 141 138 138  Potassium 3.5 - 5.2 mmol/L 4.3 4.4 4.6  Chloride 96 - 106 mmol/L 106 101 101  CO2 20 - 29  mmol/L 22 20 16(L)  Calcium 8.7 - 10.2 mg/dL 9.8 9.9 9.7     Hepatic Function Latest Ref Rng & Units 03/15/2020 10/17/2019 09/21/2018  Total Protein 6.0 - 8.5 g/dL 6.8 6.8 6.7  Albumin 3.8 - 4.9 g/dL 4.6 4.7 4.9  AST 0 - 40 IU/L _0 ALT 0 - 32 IU/L 26 35(H) 25  Alk Phosphatase 48 - 121 IU/L 65 75 63  Total Bilirubin 0.0 - 1.2 mg/dL 0.2 0.4 0.4  Bilirubin, Direct 0.00 - 0.40 mg/dL - - -    CBC Latest Ref Rng & Units 10/17/2019 09/21/2018 09/08/2017  WBC 3.4 - 10.8 x10E3/uL 9.7 12.3(H) 11.9(H)  Hemoglobin 11.1 - 15.9 g/dL 15.1 13.7 13.6  Hematocrit 34.0 - 46.6 % 43.9 41.1 41.2  Platelets 150 - 450 x10E3/uL 330 315 264   Lab Results  Component Value Date   MCV 91 10/17/2019   MCV 93 09/21/2018   MCV 92.2 09/08/2017   Lab Results  Component Value Date   TSH 1.470 03/15/2020   Lab Results  Component Value Date   HGBA1C 7.1 (A) 06/04/2020     BNP No results found for: BNP  ProBNP No results found for: PROBNP   Lipid Panel     Component Value Date/Time   CHOL 114 03/15/2020 1232   TRIG 107 03/15/2020 1232   HDL 38 (L) 03/15/2020 1232   CHOLHDL 3.0 03/15/2020 1232   CHOLHDL 5.6 09/05/2017 0259   VLDL 22 09/05/2017 0259   LDLCALC 56 03/15/2020 1232     RADIOLOGY: No results found.   Additional studies/ records that were reviewed today include:  I reviewed her [facility], cardiac catheterization report, echo Doppler study, subsequent office visits and laboratory.   ASSESSMENT:    1. Non-ST elevation (NSTEMI) myocardial infarction [facility]): September 04, 2017 with DES stent to LAD   2. Hyperlipidemia with target LDL less than 70   3. Hypothyroidism   4. Diabetes mellitus with cardiac complication [facility])     PLAN:  Carmen Anderson  is a very pleasant 60 year old female who suffered a non-ST segment elevation myocardial infarction in February 2019 and ultimately was found to have high-grade proximal to mid 90% LAD stenosis which was successfully stented  with a synergy DES stent.  She has a long-standing history of prior diabetes mellitus, hypertension, hyperlipidemia, and tobacco use.  Fortunately she quit smoking on her day of admission.  Since her initial intervention, she has been without recurrent anginal symptomatology.  Remotely, she had developed mild orthostatic hypotension on lisinopril which improved following discontinuance of lisinopril.  Presently, her blood pressure is excellent.  She has continued to be on DAPT with aspirin and Plavix and denies any bleeding.  He is diabetic on Metformin, and Jardiance.  Hemoglobin A1c in September 2021 was increased to 7.1.  She continues to be on high potency statin therapy with atorvastatin 80 mg.  LDL cholesterol in September 2021 was excellent at 56.  She has not had any anginal symptoms and has a prescription for sublingual nitroglycerin but has not needed use.  She continues to be on levothyroxine for hypothyroidism.  She will be reestablishing care with a new primary provider with Dr. Horald Pollen.  Laboratory will be done at that time.  As long as she remains stable I will see her in 1 year for cardiology reevaluation.   Medication Adjustments/Labs and Tests Ordered: Current medicines are reviewed at length with the patient today.  Concerns regarding medicines are outlined above.  Medication changes, Labs and Tests ordered today are listed in the Patient Instructions below. Patient Instructions  Medication Instructions:  The current medical regimen is effective;  continue present plan and medications.  *If  you need a refill on your cardiac medications before your next appointment, please call your pharmacy*   Follow-Up: At [facility], you and your health needs are our priority.  As part of our continuing mission to provide you with exceptional heart care, we have created designated Provider Care Teams.  These Care Teams include your primary Cardiologist (physician) and Advanced Practice  Providers (APPs -  Physician Assistants and Nurse Practitioners) who all work together to provide you with the care you need, when you need it.  We recommend signing up for the patient portal called "MyChart".  Sign up information is provided on this After Visit Summary.  MyChart is used to connect with patients for Virtual Visits (Telemedicine).  Patients are able to view lab/test results, encounter notes, upcoming appointments, etc.  Non-urgent messages can be sent to your provider as well.   To learn more about what you can do with MyChart, go to [email or web address].    Your next appointment:   12 month(s)  The format for your next appointment:   In Person  Provider:   Shelva Majestic, MD        Signed, Shelva Majestic, MD  09/18/2020 12:11 PM    [facility] [address] [place], [place], [place]  [address] Phone: [phone]

## 2020-09-13 ENCOUNTER — Other Ambulatory Visit: Payer: Self-pay

## 2020-09-13 DIAGNOSIS — I214 Non-ST elevation (NSTEMI) myocardial infarction: Secondary | ICD-10-CM

## 2020-09-13 MED ORDER — CLOPIDOGREL BISULFATE 75 MG PO TABS: 75.0000 mg | ORAL_TABLET | Freq: Every day | ORAL | 3 refills | Status: DC

## 2020-09-18 ENCOUNTER — Encounter: Payer: Self-pay | Admitting: Cardiovascular Disease

## 2020-10-01 ENCOUNTER — Ambulatory Visit: Payer: BC Managed Care – PPO

## 2020-10-01 DIAGNOSIS — Z0001 Encounter for general adult medical examination with abnormal findings: Secondary | ICD-10-CM | POA: Diagnosis not present

## 2020-10-01 DIAGNOSIS — E559 Vitamin D deficiency, unspecified: Secondary | ICD-10-CM | POA: Diagnosis not present

## 2020-10-01 DIAGNOSIS — I2510 Atherosclerotic heart disease of native coronary artery without angina pectoris: Secondary | ICD-10-CM | POA: Diagnosis not present

## 2020-10-01 DIAGNOSIS — E039 Hypothyroidism, unspecified: Secondary | ICD-10-CM | POA: Diagnosis not present

## 2020-10-01 DIAGNOSIS — E119 Type 2 diabetes mellitus without complications: Secondary | ICD-10-CM | POA: Diagnosis not present

## 2020-10-01 DIAGNOSIS — E785 Hyperlipidemia, unspecified: Secondary | ICD-10-CM | POA: Diagnosis not present

## 2020-10-10 ENCOUNTER — Ambulatory Visit: Payer: BC Managed Care – PPO | Admitting: Internal Medicine

## 2020-10-10 ENCOUNTER — Other Ambulatory Visit: Payer: Self-pay

## 2020-10-10 ENCOUNTER — Encounter: Payer: Self-pay | Admitting: Internal Medicine

## 2020-10-10 VITALS — BP 110/70 | HR 55 | Ht 63.0 in | Wt 114.2 lb

## 2020-10-10 DIAGNOSIS — E785 Hyperlipidemia, unspecified: Secondary | ICD-10-CM

## 2020-10-10 DIAGNOSIS — E1140 Type 2 diabetes mellitus with diabetic neuropathy, unspecified: Secondary | ICD-10-CM | POA: Diagnosis not present

## 2020-10-10 DIAGNOSIS — E1142 Type 2 diabetes mellitus with diabetic polyneuropathy: Secondary | ICD-10-CM

## 2020-10-10 NOTE — Patient Instructions (Addendum)
Please continue: - Metformin ER 500 mg 2x a day with meals - Jardiance 25 mg before breakfast  Please try to reduce the dietary fat.   Please return in 4 months with your sugar log.

## 2020-10-10 NOTE — Progress Notes (Signed)
Patient ID: [name], female   DOB: 1960-12-13, 60 y.o.   MRN: [id number]   This visit occurred during the SARS-CoV-2 public health emergency.  Safety protocols were in place, including screening questions prior to the visit, additional usage of staff PPE, and extensive cleaning of exam room while observing appropriate contact time as indicated for disinfecting solutions.   HPI: Carmen Anderson is a 60 y.o.-year-old female, initially referred by her PCP, Carmen Ree, NP, returning for follow-up for DM2, dx in 2001-2002, non-insulin-dependent, now more controlled, with complications (CAD - s/p NSTEMI 2019, s/p stent; PN). Last visit 4 months ago.  Interim history: She is very stressed about her brother who has DM and is a double amputee and now blind. She otherwise has no new complaints.  No nausea, blurry vision, increased urination, weight loss.  Reviewed HbA1c levels: 10/01/2020: HbA1c 7.7% Lab Results  Component Value Date   HGBA1C 7.1 (A) 06/04/2020   HGBA1C 7.0 (A) 01/27/2020   HGBA1C 7.4 (A) 09/22/2019   HGBA1C 6.4 (A) 05/18/2019   HGBA1C 7.6 (A) 12/15/2018   HGBA1C 6.7 (A) 08/16/2018   HGBA1C 6.9 (A) 04/13/2018   HGBA1C 6.6 (A) 12/14/2017   HGBA1C 6.9 (H) 09/06/2017   HGBA1C 7 08/14/2017   HGBA1C 6.8 04/13/2017   HGBA1C 6.7 11/25/2016   HGBA1C 7.0 06/03/2016   HGBA1C 7.0 03/04/2016   HGBA1C 7.0 12/03/2015   HGBA1C 7.9 (H) 09/05/2015   HGBA1C 7.7 (H) 05/09/2015   HGBA1C 7.0 10/27/2014   HGBA1C 6.7 04/28/2014   HGBA1C 7.0 12/14/2013   Pt is on a regimen of: - Metformin ER 500 mg once a day (decreased by PCP in 09/2018) >> increase back to twice a day 12/2018 - Januvia 100 mg daily in am >> Jardiance 10 mg before breakfast >> 25 mg (increased 10/01/2020)  Pt checks her sugars 2x a day per review of her log: - am: 79-121, 127 >> 76-122 >> 81-120 >> 99-123, 174 - 2h after b'fast: 124, 131-163, 182 >> 105-170 - before lunch:  79-98, 118 >> 79-101, 121 >> 90-122 - 2h  after lunch: 145-171 >> 140-171 >> 139-181 - before dinner: 84-119 >> 79-111 >> 91-128 - 2h after dinner: 1141-160 >> 141-169, 180 >> 137-170 - bedtime: 89-123 >> 92-115 >> 91-122 >> 98-130 - nighttime: n/c >> n/c Lowest sugar was 38 ... 76 >> 74 >> 90; she has hypoglycemia awareness in the 50s. Highestin: 177 >> 187 >> 190 >> 181.  Glucometer: FreeStyle >> [facility]  Pt's meals are: - snack:  1/2 yoghurt, PB + bread - Breakfast: wheat waffle + sugar free syrup or wheat muffin w/ 1 egg  - Lunch: salads or [place] sandwich on wheat bread >> banana sandwich with light mayo - Dinner: soups, salad, meat 2x a weekly (chicken, salmon) + veggies - Snacks: pineapple, celery  She continues to exercise 4-5 times a weeks on the treadmill.  -No CKD, last BUN/creatinine:  10/01/2020: Glucose 128, BUN/creatinine 13/0.68, GFR 96 Lab Results  Component Value Date   BUN 11 03/15/2020   CREATININE 0.73 03/15/2020  On lisinopril 2.5. -+ HL; last set of lipids: 10/01/2020: 137/77/53/69 Lab Results  Component Value Date   CHOL 114 03/15/2020   HDL 38 (L) 03/15/2020   LDLCALC 56 03/15/2020   TRIG 107 03/15/2020   CHOLHDL 3.0 03/15/2020  On Lipitor 80, fish oil. - last eye exam was 01/28/2020: No DR; [facility].  - + Numbness and tingling and occasional burning in  her feet.  On low-dose Neurontin and B complex. These help.  She had a NSTEMI in 08/2017.  At that time, she had a catheterization and had 90% blockage on LAD.  She had a stent placed.  She has HTN and controlled hypothyroidism.  Latest TSH was normal: 10/01/2020: TSH 2.4 Lab Results  Component Value Date   TSH 1.470 03/15/2020   In 10/01/2020 she had a very low vitamin D of 14.6.  She was started on supplementation - Ergocalciferol 50,000 units 2x a day.  ROS: Constitutional: no weight gain/no weight loss, no fatigue, no subjective hyperthermia, no subjective hypothermia Eyes: no blurry vision, no xerophthalmia ENT: no sore  throat, no nodules palpated in neck, no dysphagia, no odynophagia, no hoarseness Cardiovascular: no CP/no SOB/no palpitations/no leg swelling Respiratory: no cough/no SOB/no wheezing Gastrointestinal: no N/no V/no D/no C/no acid reflux Musculoskeletal: no muscle aches/no joint aches Skin: no rashes, no hair loss Neurological: no tremors/+ numbness/+ tingling/no dizziness  I reviewed pt's medications, allergies, PMH, social hx, family hx, and changes were documented in the history of present illness. Otherwise, unchanged from my initial visit note.  Past Medical History:  Diagnosis Date  . DM type 2 (diabetes mellitus, type 2) (HCC)   . History of hiatal hernia   . Hyperlipidemia   . Hypertension   . Hypothyroidism    Past Surgical History:  Procedure Laterality Date  . ABDOMINAL HYSTERECTOMY  1995  . CARDIAC CATHETERIZATION    . CORONARY STENT INTERVENTION N/A 09/07/2017   Procedure: CORONARY STENT INTERVENTION;  Surgeon: Runell Gess, MD;  Location: [facility];  Service: Cardiovascular;  Laterality: N/A;  . LEFT HEART CATH AND CORONARY ANGIOGRAPHY N/A 09/07/2017   Procedure: LEFT HEART CATH AND CORONARY ANGIOGRAPHY;  Surgeon: Runell Gess, MD;  Location: [facility];  Service: Cardiovascular;  Laterality: N/A;  . STAPEDECTOMY Right ~ 2002   Social History   Social History  . Marital Status: Married    Spouse Name: N/A  . Number of Children: 0  . Years of Education: 12   Occupational History  . property asst. Production designer, theatre/television/film    Social History Main Topics  . Smoking status: Current Every Day Smoker -- 0.50 packs/day for 10 years    Types: Cigarettes  . Smokeless tobacco: Not on file     Comment: pt states thinking of quitting  . Alcohol Use: No  . Drug Use: No   Social History Narrative   Exercise: walking, 4 times/week for 30 minutes.   Current Outpatient Medications on File Prior to Visit  Medication Sig Dispense Refill  . aspirin EC 81 MG EC tablet  Take 1 tablet (81 mg total) by mouth daily.    [name] atorvastatin (LIPITOR) 80 MG tablet TAKE 1 TABLET BY MOUTH EVERY DAY 90 tablet 0  . clopidogrel (PLAVIX) 75 MG tablet Take 1 tablet (75 mg total) by mouth daily. 90 tablet 3  . empagliflozin (JARDIANCE) 10 MG TABS tablet Take 10 mg by mouth daily. 90 mg 3  . fluticasone (FLONASE) 50 MCG/ACT nasal spray Place 2 sprays into both nostrils daily. 16 g 6  . gabapentin (NEURONTIN) 100 MG capsule Take 1 capsule (100 mg total) by mouth at bedtime. 90 capsule 3  . levothyroxine (SYNTHROID) 50 MCG tablet Take 1 tablet (50 mcg total) by mouth daily. 90 tablet 3  . metFORMIN (GLUCOPHAGE-XR) 500 MG 24 hr tablet TAKE 1 TABLET BY MOUTH TWICE DAILY WITH MEALS AS DIRECTED 180 tablet  3  . nitroGLYCERIN (NITROSTAT) 0.4 MG SL tablet Place 1 tablet (0.4 mg total) under the tongue every 5 (five) minutes x 3 doses as needed for chest pain. 25 tablet 3   No current facility-administered medications on file prior to visit.   Allergies  Allergen Reactions  . Aspirin Other (See Comments)    Burns stomach  . Codeine Rash   Family History  Problem Relation Age of Onset  . Diabetes Mother   . Heart disease Mother   . Stroke Father        2011  . Hyperlipidemia Father   . Diabetes Sister   . Heart disease Sister   . Heart disease Brother        congenital   PE: BP 110/70 (BP Location: Right Arm, Patient Position: Sitting, Cuff Size: Normal)   Pulse (!) 55   Ht 5\' 3"  (1.6 m)   Wt 114 lb 3.2 oz (51.8 kg)   SpO2 98%   BMI 20.23 kg/m  Body mass index is 20.23 kg/m. Wt Readings from Last 3 Encounters:  10/10/20 114 lb 3.2 oz (51.8 kg)  09/11/20 115 lb 12.8 oz (52.5 kg)  06/18/20 114 lb (51.7 kg)   Constitutional: normal weight, in NAD Eyes: PERRLA, EOMI, no exophthalmos ENT: moist mucous membranes, no thyromegaly, no cervical lymphadenopathy Cardiovascular: RRR, No MRG Respiratory: CTA B Gastrointestinal: abdomen soft, NT, ND, BS+ Musculoskeletal: no  deformities, strength intact in all 4 Skin: moist, warm, no rashes Neurological: no tremor with outstretched hands, DTR normal in all 4  ASSESSMENT: 1. DM2, non-insulin-dependent, uncontrolled, with complications - CAD, s/p NSTEMI 2019, s/p stent - PN   2. PN   3. HL  PLAN:  1. Patient with history of longstanding, uncontrolled, type 2 diabetes, with improved control after her NSTEMI + stent placement in 08/2017.  We started Jardiance after this, mostly for the cardiovascular benefits. She just saw her PCP on 10/01/2020 and, since an HbA1c was higher, at 7.7% (compared to 7.0% at last visit), her Jardiance dose was increased from 10 mg daily to 25 mg daily.  She tolerates this dose well. -At today's visit, sugars are slightly higher than before, but still mostly at goal.  She had 3 exceptions slightly higher than goal, but I do not feel that these correlate well with her recent HbA1c of 7.7%.  For now, my suggestion would be to continue with the higher dose of Jardiance and also the Metformin (we cannot increase the dose as she may get diarrhea with higher doses).  At next visit, if the sugars are not aligning better with the HbA1c, I plan to check a fructosamine level.  If this is elevated, she may need to start a GLP-1 receptor agonist.  Discussed about benefits and possible side effects.  She agrees with the plan.  However, I would like to avoid further weight loss for her, so for now, we will only continue with the Metformin and SGLT2 inhibitor. -I did advise her to try to reduce fatty foods in her diet to reduce insulin resistance and improve blood sugars after meals. - I suggested to:  Patient Instructions  Please continue: - Metformin ER 500 mg 2x a day with meals - Jardiance 25 mg before breakfast  Please return in 4 months with your sugar log.  - advised to check sugars at different times of the day - 1x a day, rotating check times - advised for yearly eye exams >> she is UTD -  return to clinic in 3-4 months   2. Diabetic PN -No new symptoms -She continues on low-dose Neurontin, 100 mg daily and B complex  3. HL -Reviewed latest lipid panel from earlier this month: 137/77/53/69, all fractions at goal -Continues fish oil and Lipitor 80 mg daily without side effects  Carlus Pavlovristina Matthew Cina, MD PhD [facility]

## 2020-10-15 ENCOUNTER — Other Ambulatory Visit: Payer: Self-pay | Admitting: Cardiovascular Disease

## 2020-10-19 ENCOUNTER — Other Ambulatory Visit: Payer: Self-pay | Admitting: Internal Medicine

## 2020-10-19 DIAGNOSIS — E1140 Type 2 diabetes mellitus with diabetic neuropathy, unspecified: Secondary | ICD-10-CM

## 2020-11-20 DIAGNOSIS — E119 Type 2 diabetes mellitus without complications: Secondary | ICD-10-CM | POA: Diagnosis not present

## 2020-11-20 DIAGNOSIS — E039 Hypothyroidism, unspecified: Secondary | ICD-10-CM | POA: Diagnosis not present

## 2020-11-20 DIAGNOSIS — Z0001 Encounter for general adult medical examination with abnormal findings: Secondary | ICD-10-CM | POA: Diagnosis not present

## 2020-11-20 DIAGNOSIS — E785 Hyperlipidemia, unspecified: Secondary | ICD-10-CM | POA: Diagnosis not present

## 2020-11-22 ENCOUNTER — Other Ambulatory Visit: Payer: Self-pay

## 2020-11-22 ENCOUNTER — Ambulatory Visit
Admission: RE | Admit: 2020-11-22 | Discharge: 2020-11-22 | Disposition: A | Discharge status: Home / Self Care | Payer: BC Managed Care – PPO | Source: Ambulatory Visit | Attending: Family Medicine | Admitting: Family Medicine

## 2020-11-22 DIAGNOSIS — Z1231 Encounter for screening mammogram for malignant neoplasm of breast: Secondary | ICD-10-CM

## 2021-01-13 ENCOUNTER — Other Ambulatory Visit: Payer: Self-pay | Admitting: Cardiovascular Disease

## 2021-01-30 ENCOUNTER — Encounter: Payer: Self-pay | Admitting: Internal Medicine

## 2021-01-30 ENCOUNTER — Other Ambulatory Visit: Payer: Self-pay

## 2021-01-30 ENCOUNTER — Ambulatory Visit: Payer: BC Managed Care – PPO | Admitting: Internal Medicine

## 2021-01-30 VITALS — BP 130/62 | HR 68 | Ht 63.0 in | Wt 117.6 lb

## 2021-01-30 DIAGNOSIS — E1142 Type 2 diabetes mellitus with diabetic polyneuropathy: Secondary | ICD-10-CM | POA: Diagnosis not present

## 2021-01-30 DIAGNOSIS — E1140 Type 2 diabetes mellitus with diabetic neuropathy, unspecified: Secondary | ICD-10-CM

## 2021-01-30 DIAGNOSIS — E785 Hyperlipidemia, unspecified: Secondary | ICD-10-CM | POA: Diagnosis not present

## 2021-01-30 LAB — POCT GLYCOSYLATED HEMOGLOBIN (HGB A1C): Hemoglobin A1C: 7.3 % — AB (ref 4.0–5.6)

## 2021-01-30 NOTE — Addendum Note (Signed)
Addended by: Kenyon Ana on: 01/30/2021 08:41 AM   Modules accepted: Orders

## 2021-01-30 NOTE — Progress Notes (Signed)
He has patient ID: Carmen Anderson, female   DOB: [birth year], 60 y.o.   MRN: [id number]   This visit occurred during the SARS-CoV-2 public health emergency.  Safety protocols were in place, including screening questions prior to the visit, additional usage of staff PPE, and extensive cleaning of exam room while observing appropriate contact time as indicated for disinfecting solutions.   HPI: Carmen Anderson is a 60 y.o.-year-old female, initially referred by her PCP, Olean Ree, NP, returning for follow-up for DM2, dx in 2001-2002, non-insulin-dependent, now more controlled, with complications (CAD - s/p NSTEMI 2019, s/p stent; PN). Last visit 4 months ago.  Interim history: No increased urination, blurry vision, nausea, chest pain. She had burning sensation in lower legs few weeks ago >> resolved.  Reviewed HbA1c levels: 10/01/2020: HbA1c 7.7% Lab Results  Component Value Date   HGBA1C 7.1 (A) 06/04/2020   HGBA1C 7.0 (A) 01/27/2020   HGBA1C 7.4 (A) 09/22/2019   HGBA1C 6.4 (A) 05/18/2019   HGBA1C 7.6 (A) 12/15/2018   HGBA1C 6.7 (A) 08/16/2018   HGBA1C 6.9 (A) 04/13/2018   HGBA1C 6.6 (A) 12/14/2017   HGBA1C 6.9 (H) 09/06/2017   HGBA1C 7 08/14/2017   HGBA1C 6.8 04/13/2017   HGBA1C 6.7 11/25/2016   HGBA1C 7.0 06/03/2016   HGBA1C 7.0 03/04/2016   HGBA1C 7.0 12/03/2015   HGBA1C 7.9 (H) 09/05/2015   HGBA1C 7.7 (H) 05/09/2015   HGBA1C 7.0 10/27/2014   HGBA1C 6.7 04/28/2014   HGBA1C 7.0 12/14/2013   Pt is on a regimen of: - Metformin ER 500 mg once a day (decreased by PCP in 09/2018) >> increase back to twice a day 12/2018 - Januvia 100 mg daily in am >> Jardiance 10 mg before breakfast >> 25 mg (increased 10/01/2020)  Pt checks her sugars 2x a day per review of her log: - am: 76-122 >> 81-120 >> 99-123, 174 >> 89-117, 129 - 2h after b'fast: 124, 131-163, 182 >> 105-170 >> 103-160, 170 - before lunch:  79-98, 118 >> 79-101, 121 >> 90-122 >> 89-126 - 2h after lunch: 145-171 >>  140-171 >> 139-181 >> 122, 140-180 - before dinner: 84-119 >> 79-111 >> 91-128 >> 86-126, 136 - 2h after dinner:  141-169, 180 >> 137-170 >> 137-163, 190 - bedtime: 89-123 >> 92-115 >> 91-122 >> 98-130 >> 99-126 - nighttime: n/c >> n/c Lowest sugar was 38 ... 76 >> 74 >> 90 >> 86; she has hypoglycemia awareness in the 50s. Highestin: 177 >> 187 >> 190 >> 181 >> 191.  Glucometer: FreeStyle >> [facility]  Pt's meals are: - snack:  1/2 yoghurt, PB + bread - Breakfast: wheat waffle + sugar free syrup or wheat muffin w/ 1 egg  - Lunch: salads or [place] sandwich on wheat bread >> banana sandwich with light mayo - Dinner: soups, salad, meat 2x a weekly (chicken, salmon) + veggies - Snacks: pineapple, celery  She continues to exercise 4-5 times a weeks on the treadmill.  -No CKD, last BUN/creatinine:  10/01/2020: Glucose 128, BUN/creatinine 13/0.68, GFR 96 Lab Results  Component Value Date   BUN 11 03/15/2020   CREATININE 0.73 03/15/2020  On lisinopril 2.5.  -+ HL; last set of lipids: 10/01/2020: 137/77/53/69 Lab Results  Component Value Date   CHOL 114 03/15/2020   HDL 38 (L) 03/15/2020   LDLCALC 56 03/15/2020   TRIG 107 03/15/2020   CHOLHDL 3.0 03/15/2020  On Lipitor 80, fish oil.  - last eye exam was 01/28/2020: No DR; [facility].  Coming up next week.  - + Numbness and tingling and occasional burning in her feet.  On low-dose Neurontin and B complex. These help.  She had a NSTEMI in 08/2017.  At that time, she had a catheterization and had 90% blockage on LAD.  She had a stent placed.  She has HTN and controlled hypothyroidism. In 10/01/2020 she had a very low vitamin D of 14.6.  She was started on supplementation - Ergocalciferol 50,000 units 2x a day.  Latest TSH was normal: 10/01/2020: TSH 2.4 Lab Results  Component Value Date   TSH 1.470 03/15/2020    ROS: Constitutional: no weight gain/no weight loss, no fatigue, no subjective hyperthermia, no subjective  hypothermia Eyes: no blurry vision, no xerophthalmia ENT: no sore throat, no nodules palpated in neck, no dysphagia, no odynophagia, no hoarseness Cardiovascular: no CP/no SOB/no palpitations/no leg swelling Respiratory: no cough/no SOB/no wheezing Gastrointestinal: no N/no V/no D/no C/no acid reflux Musculoskeletal: no muscle aches/no joint aches Skin: no rashes, no hair loss Neurological: no tremors/+ numbness/+ tingling/no dizziness  I reviewed pt's medications, allergies, PMH, social hx, family hx, and changes were documented in the history of present illness. Otherwise, unchanged from my initial visit note.  Past Medical History:  Diagnosis Date   DM type 2 (diabetes mellitus, type 2) (HCC)    History of hiatal hernia    Hyperlipidemia    Hypertension    Hypothyroidism    Past Surgical History:  Procedure Laterality Date   ABDOMINAL HYSTERECTOMY  1995   CARDIAC CATHETERIZATION     CORONARY STENT INTERVENTION N/A 09/07/2017   Procedure: CORONARY STENT INTERVENTION;  Surgeon: Runell Gess, MD;  Location: [facility];  Service: Cardiovascular;  Laterality: N/A;   LEFT HEART CATH AND CORONARY ANGIOGRAPHY N/A 09/07/2017   Procedure: LEFT HEART CATH AND CORONARY ANGIOGRAPHY;  Surgeon: Runell Gess, MD;  Location: [facility];  Service: Cardiovascular;  Laterality: N/A;   STAPEDECTOMY Right ~ 2002   Social History   Social History   Marital Status: Married    Spouse Name: N/A   Number of Children: 0   Years of Education: 12   Occupational History   property asst. Production designer, theatre/television/film    Social History Main Topics   Smoking status: Current Every Day Smoker -- 0.50 packs/day for 10 years    Types: Cigarettes   Smokeless tobacco: Not on file     Comment: pt states thinking of quitting   Alcohol Use: No   Drug Use: No   Social History Narrative   Exercise: walking, 4 times/week for 30 minutes.   Current Outpatient Medications on File Prior to Visit  Medication  Sig Dispense Refill   aspirin EC 81 MG EC tablet Take 1 tablet (81 mg total) by mouth daily.     atorvastatin (LIPITOR) 80 MG tablet TAKE 1 TABLET BY MOUTH EVERY DAY 90 tablet 3   clopidogrel (PLAVIX) 75 MG tablet Take 1 tablet (75 mg total) by mouth daily. 90 tablet 3   ergocalciferol (VITAMIN D2) 1.25 MG (50000 UT) capsule      fluticasone (FLONASE) 50 MCG/ACT nasal spray Place 2 sprays into both nostrils daily. 16 g 6   gabapentin (NEURONTIN) 100 MG capsule Take 1 capsule (100 mg total) by mouth at bedtime. 90 capsule 3   JARDIANCE 25 MG TABS tablet Take 25 mg by mouth daily.     levothyroxine (SYNTHROID) 50 MCG tablet Take 1 tablet (50 mcg total) by mouth daily.  90 tablet 3   metFORMIN (GLUCOPHAGE-XR) 500 MG 24 hr tablet TAKE 1 TABLET BY MOUTH TWICE DAILY WITH MEALS AS DIRECTED 180 tablet 3   nitroGLYCERIN (NITROSTAT) 0.4 MG SL tablet Place 1 tablet (0.4 mg total) under the tongue every 5 (five) minutes x 3 doses as needed for chest pain. 25 tablet 3   No current facility-administered medications on file prior to visit.   Allergies  Allergen Reactions   Aspirin Other (See Comments)    Burns stomach   Codeine Rash   Family History  Problem Relation Age of Onset   Diabetes Mother    Heart disease Mother    Stroke Father        2011   Hyperlipidemia Father    Diabetes Sister    Heart disease Sister    Heart disease Brother        congenital   PE: BP 130/62 (BP Location: Right Arm, Patient Position: Sitting, Cuff Size: Normal)   Pulse 68   Ht 5\' 3"  (1.6 m)   Wt 117 lb 9.6 oz (53.3 kg)   SpO2 95%   BMI 20.83 kg/m  Body mass index is 20.83 kg/m. Wt Readings from Last 3 Encounters:  01/30/21 117 lb 9.6 oz (53.3 kg)  10/10/20 114 lb 3.2 oz (51.8 kg)  09/11/20 115 lb 12.8 oz (52.5 kg)   Constitutional: normal weight, in NAD Eyes: PERRLA, EOMI, no exophthalmos ENT: moist mucous membranes, no thyromegaly, no cervical lymphadenopathy Cardiovascular: RRR, No MRG Respiratory:  CTA B Gastrointestinal: abdomen soft, NT, ND, BS+ Musculoskeletal: no deformities, strength intact in all 4 Skin: moist, warm, no rashes Neurological: no tremor with outstretched hands, DTR normal in all 4  ASSESSMENT: 1. DM2, non-insulin-dependent, uncontrolled, with complications - CAD, s/p NSTEMI 2019, s/p stent - PN   2. PN   3. HL  PLAN:  1. Patient with history of longstanding, uncontrolled, type 2 diabetes, with improved control after her NSTEMI + stent placement in 08/2017.  We started Jardiance after this, mostly for the cardiovascular benefits.  She tolerates this well.  Of note, she could not tolerate higher doses of metformin due to diarrhea.  At last visit, sugars were slightly higher than before, but still mostly at goal.  HbA1c was 7.7%, however, this was higher than expected from her log.  We continued the same regimen but we did discuss about possibly adding a GLP-1 receptor agonist in the near future.  We discussed about benefits and possible side effects.  I did not add this at last visit as I wanted to avoid further weight loss for her.  We discussed at last visit about reducing fatty foods. -At today's visit, sugars are improved from before and mostly at goal with few hyperglycemic exceptions.  Most of these are after having sweets or other carbs.  In the last month, however, highest blood sugar has been at 191, and she only had 2 instances.  Overall, she appears to have good diabetes control so at this visit we discussed about continuing the same regimen. - I suggested to:  Patient Instructions  Please continue: - Metformin ER 500 mg 2x a day with meals - Jardiance 25 mg before breakfast  Please return in 4 months with your sugar log.  - we checked her HbA1c: 7.3% (lower) - advised to check sugars at different times of the day - 1x a day, rotating check times - advised for yearly eye exams >> she is due but has an appointment  coming up - return to clinic in 4  months   2. Diabetic PN -Stable -Continues on low-dose Neurontin (100 mg daily) and B complex's  3. HL - Reviewed latest lipid panel from 10/01/2020: 137/77/53/69: HDL slightly low, the rest of the fractions at goal Lab Results  Component Value Date   CHOL 114 03/15/2020   HDL 38 (L) 03/15/2020   LDLCALC 56 03/15/2020   TRIG 107 03/15/2020   CHOLHDL 3.0 03/15/2020  - Continues fish oil and Lipitor 80 mg daily without side effects.  Carlus Pavlovristina Day Greb, MD PhD [facility]

## 2021-01-30 NOTE — Patient Instructions (Signed)
Please continue: - Metformin ER 500 mg 2x a day with meals - Jardiance 25 mg before breakfast  Please return in 4 months with your sugar log.

## 2021-03-26 DIAGNOSIS — E119 Type 2 diabetes mellitus without complications: Secondary | ICD-10-CM | POA: Diagnosis not present

## 2021-03-26 DIAGNOSIS — E559 Vitamin D deficiency, unspecified: Secondary | ICD-10-CM | POA: Diagnosis not present

## 2021-03-26 DIAGNOSIS — Z2839 Other underimmunization status: Secondary | ICD-10-CM | POA: Diagnosis not present

## 2021-03-26 DIAGNOSIS — E039 Hypothyroidism, unspecified: Secondary | ICD-10-CM | POA: Diagnosis not present

## 2021-03-26 DIAGNOSIS — Z0001 Encounter for general adult medical examination with abnormal findings: Secondary | ICD-10-CM | POA: Diagnosis not present

## 2021-03-26 DIAGNOSIS — M1990 Unspecified osteoarthritis, unspecified site: Secondary | ICD-10-CM | POA: Diagnosis not present

## 2021-04-09 IMAGING — MG DIGITAL SCREENING BILAT W/ CAD
4 series · 4 of 4 positions shown · non-contrast
Comparison: Previous exam(s).

CLINICAL DATA: Screening.

EXAM:
DIGITAL SCREENING BILATERAL MAMMOGRAM WITH CAD

[L MLO]
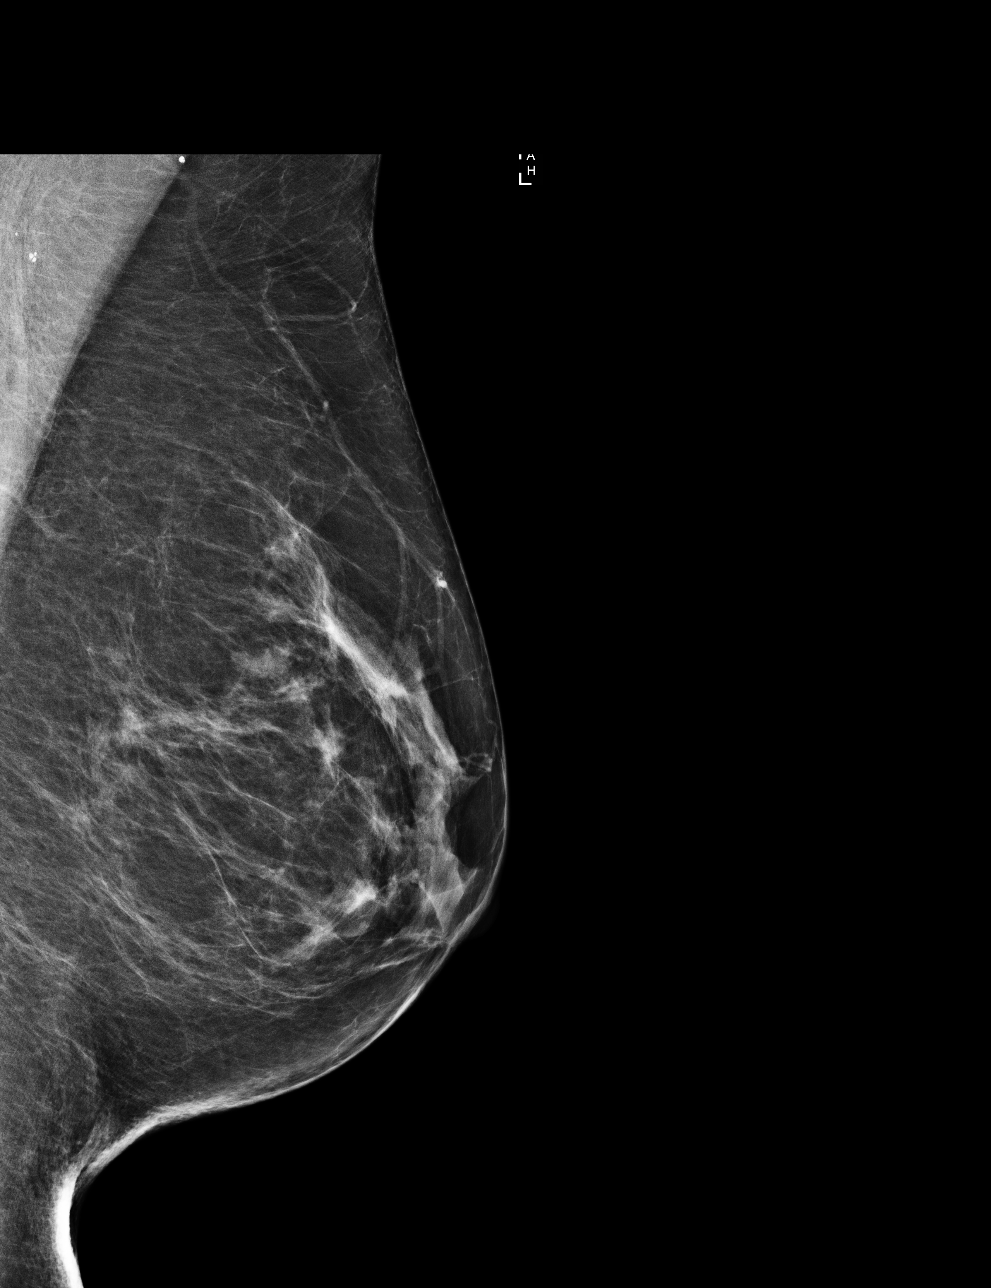

[R CC]
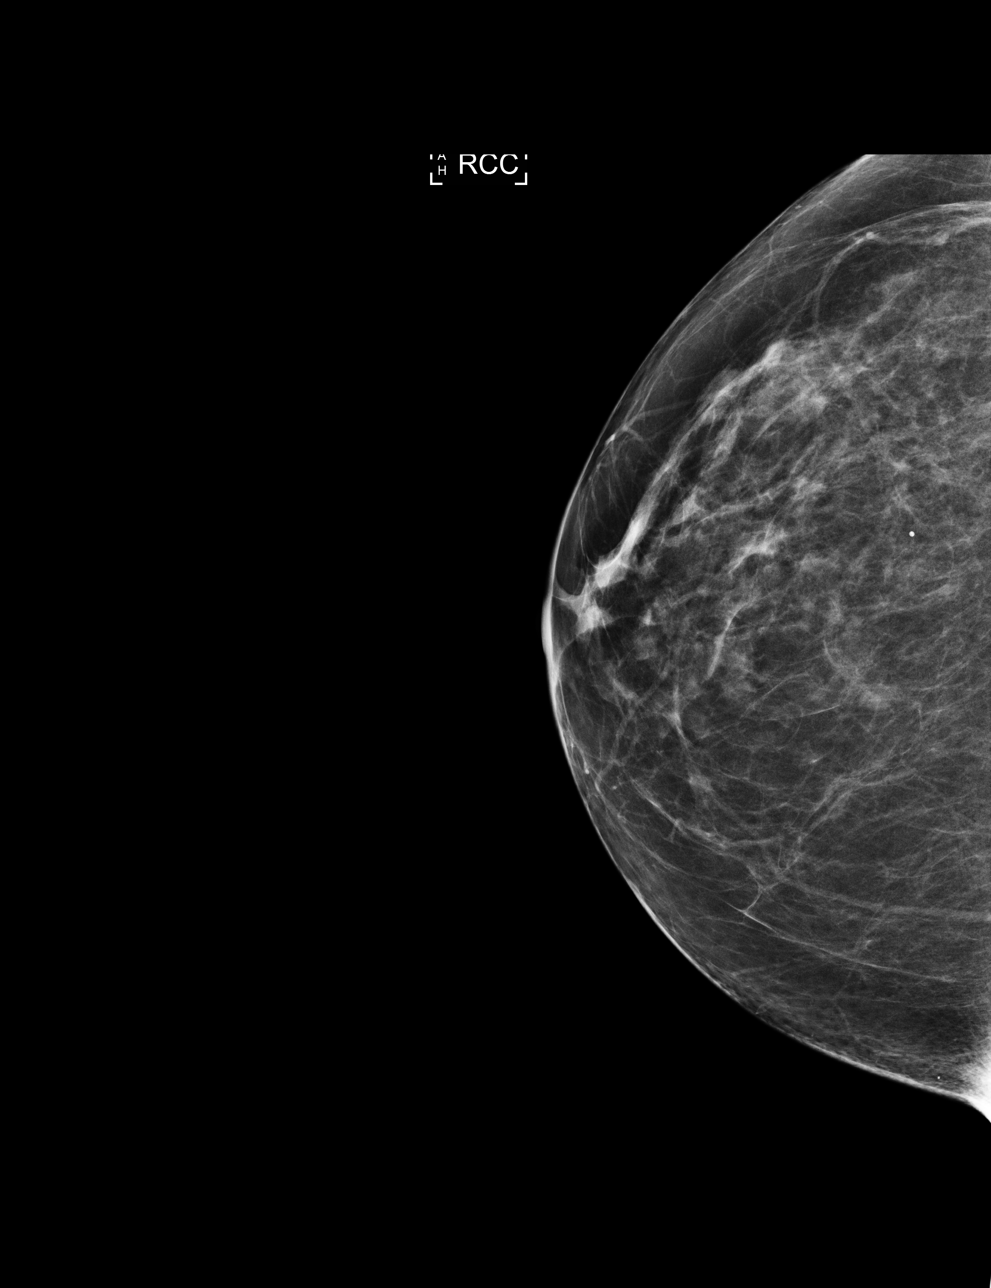

[R MLO]
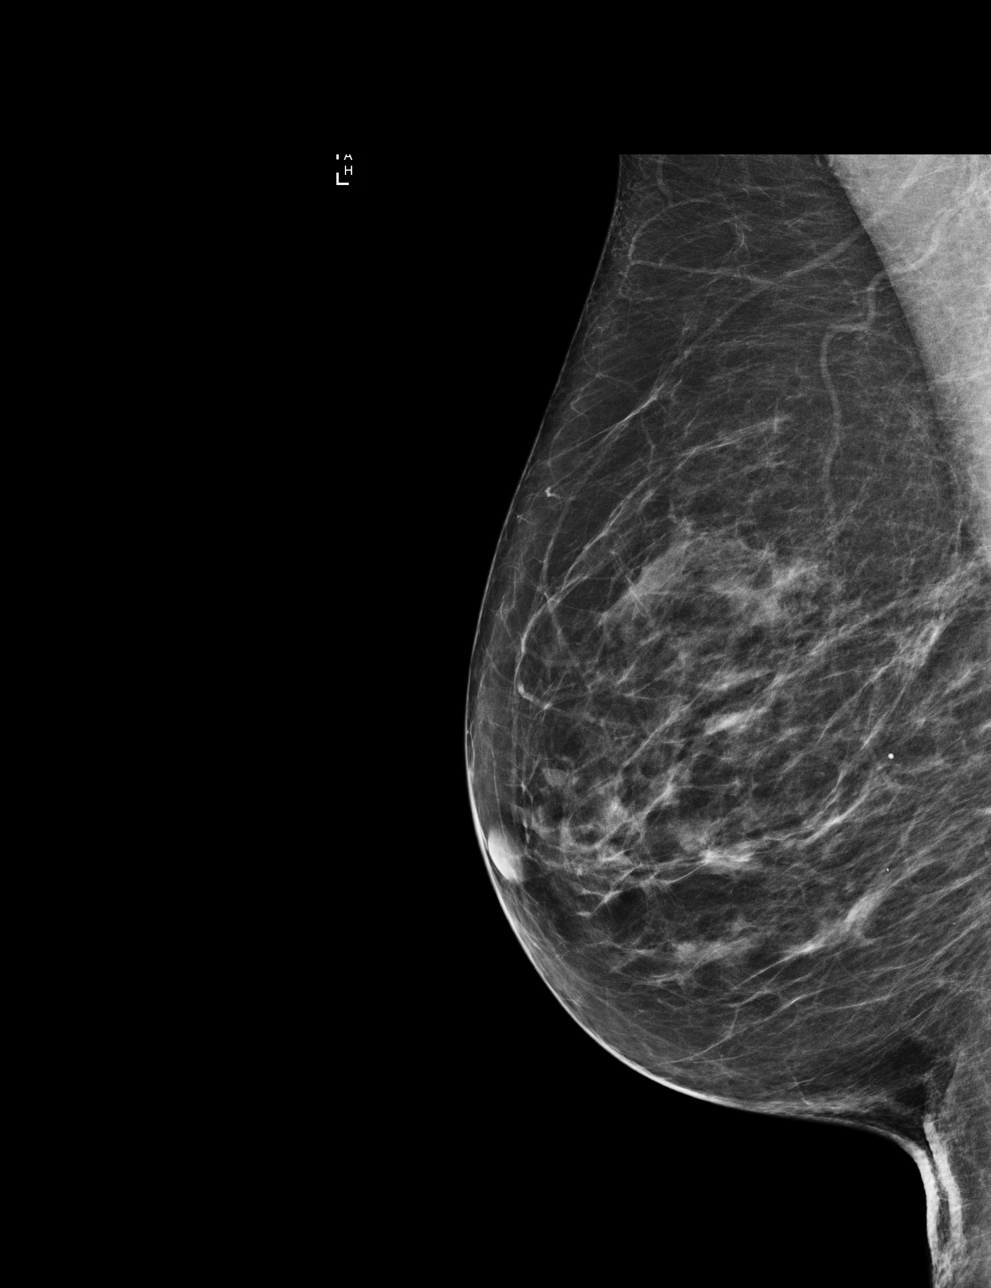

[L CC]
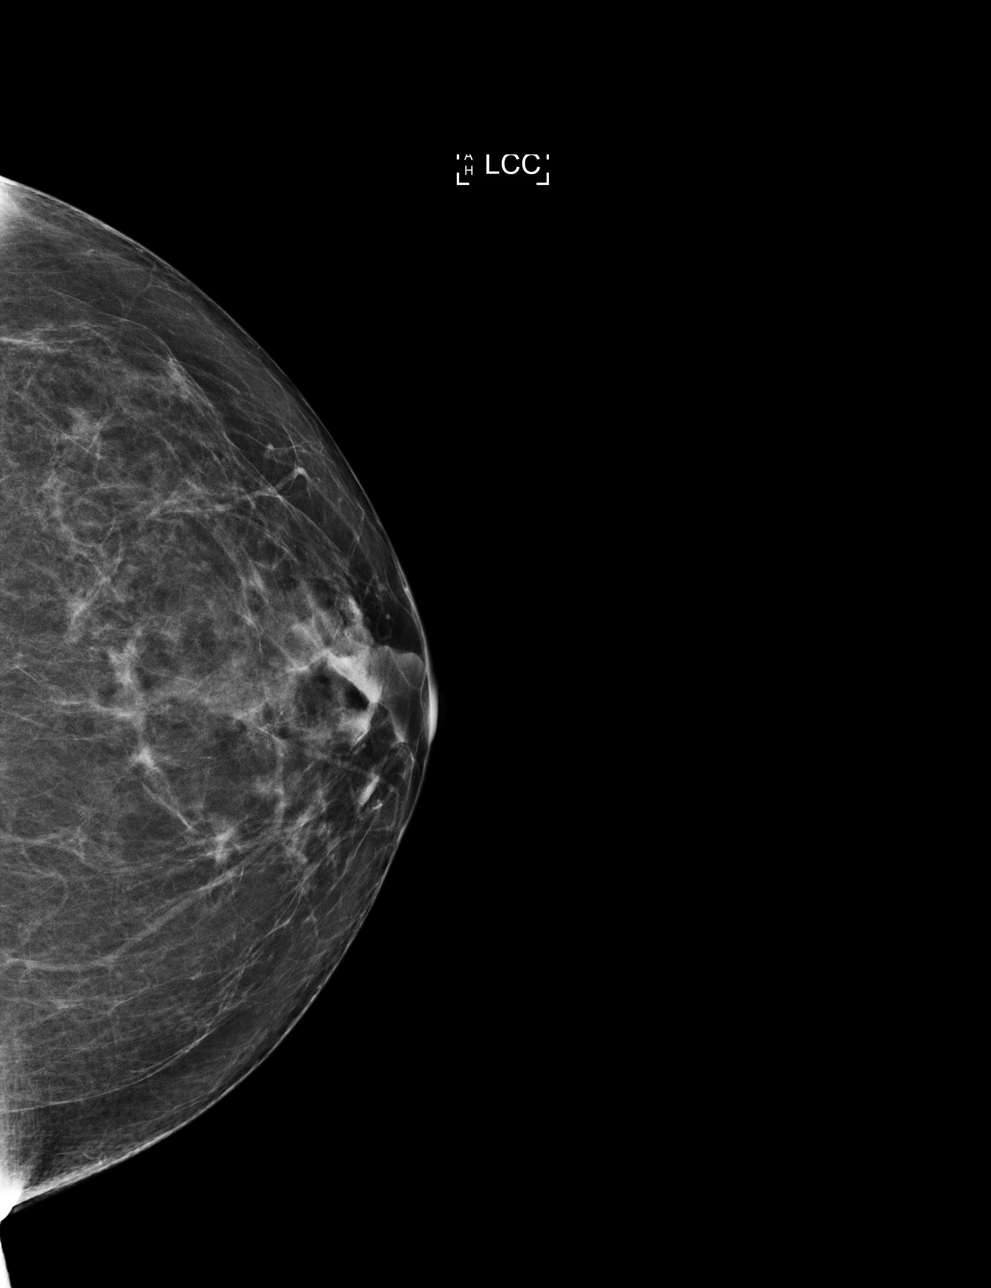

[4 of 4 positions shown; findings below may reference images not displayed]

ACR Breast Density Category b: There are scattered areas of
fibroglandular density.
FINDINGS: There are no findings suspicious for malignancy. Images were
processed with CAD.
IMPRESSION: No mammographic evidence of malignancy. A result letter of this
screening mammogram will be mailed directly to the patient.

RECOMMENDATION:
Screening mammogram in one year. (Code:AS-G-LCT)

BI-RADS CATEGORY  1: Negative.

## 2021-06-04 ENCOUNTER — Ambulatory Visit (INDEPENDENT_AMBULATORY_CARE_PROVIDER_SITE_OTHER): Payer: BC Managed Care – PPO | Admitting: Internal Medicine

## 2021-06-04 ENCOUNTER — Encounter: Payer: Self-pay | Admitting: Internal Medicine

## 2021-06-04 ENCOUNTER — Other Ambulatory Visit: Payer: Self-pay

## 2021-06-04 VITALS — BP 130/70 | HR 60 | Ht 63.0 in | Wt 123.6 lb

## 2021-06-04 DIAGNOSIS — E785 Hyperlipidemia, unspecified: Secondary | ICD-10-CM

## 2021-06-04 DIAGNOSIS — E1165 Type 2 diabetes mellitus with hyperglycemia: Secondary | ICD-10-CM

## 2021-06-04 DIAGNOSIS — E1142 Type 2 diabetes mellitus with diabetic polyneuropathy: Secondary | ICD-10-CM | POA: Diagnosis not present

## 2021-06-04 DIAGNOSIS — E1159 Type 2 diabetes mellitus with other circulatory complications: Secondary | ICD-10-CM | POA: Diagnosis not present

## 2021-06-04 LAB — BASIC METABOLIC PANEL
BUN: 16 mg/dL (ref 6–23)
CO2: 27 mEq/L (ref 19–32)
Calcium: 9.9 mg/dL (ref 8.4–10.5)
Chloride: 105 mEq/L (ref 96–112)
Creatinine, Ser: 0.8 mg/dL (ref 0.40–1.20)
GFR: 80.43 mL/min (ref >60.00)
Glucose, Bld: 249 mg/dL — ABNORMAL HIGH (ref 70–99)
Potassium: 4.6 mEq/L (ref 3.5–5.1)
Sodium: 141 mEq/L (ref 135–145)

## 2021-06-04 LAB — POCT GLYCOSYLATED HEMOGLOBIN (HGB A1C): Hemoglobin A1C: 7.8 % — AB (ref 4.0–5.6)

## 2021-06-04 MED ORDER — OZEMPIC (0.25 OR 0.5 MG/DOSE) 2 MG/1.5ML ~~LOC~~ SOPN: 0.5000 mg | PEN_INJECTOR | SUBCUTANEOUS | 3 refills | Status: DC

## 2021-06-04 MED ORDER — FREESTYLE LIBRE 3 SENSOR MISC: 1.0000 | 3 refills | Status: DC

## 2021-06-04 NOTE — Progress Notes (Addendum)
He has patient ID: Carmen Anderson, female   DOB: 1961-04-02, 60 y.o.   MRN: [id number]   This visit occurred during the SARS-CoV-2 public health emergency.  Safety protocols were in place, including screening questions prior to the visit, additional usage of staff PPE, and extensive cleaning of exam room while observing appropriate contact time as indicated for disinfecting solutions.   HPI: Carmen Anderson is a 60 y.o.-year-old female, initially referred by her PCP, Olean Ree, NP, returning for follow-up for DM2, dx in 2001-2002, non-insulin-dependent, now more controlled, with complications (CAD - s/p NSTEMI 2019, s/p stent; PN). Last visit 4 months ago.  Interim history: No increased urination, blurry vision, nausea, chest pain, CP.  Reviewed HbA1c levels: Lab Results  Component Value Date   HGBA1C 7.3 (A) 01/30/2021   HGBA1C 7.1 (A) 06/04/2020   HGBA1C 7.0 (A) 01/27/2020   HGBA1C 7.4 (A) 09/22/2019   HGBA1C 6.4 (A) 05/18/2019   HGBA1C 7.6 (A) 12/15/2018   HGBA1C 6.7 (A) 08/16/2018   HGBA1C 6.9 (A) 04/13/2018   HGBA1C 6.6 (A) 12/14/2017   HGBA1C 6.9 (H) 09/06/2017   HGBA1C 7 08/14/2017   HGBA1C 6.8 04/13/2017   HGBA1C 6.7 11/25/2016   HGBA1C 7.0 06/03/2016   HGBA1C 7.0 03/04/2016   HGBA1C 7.0 12/03/2015   HGBA1C 7.9 (H) 09/05/2015   HGBA1C 7.7 (H) 05/09/2015   HGBA1C 7.0 10/27/2014   HGBA1C 6.7 04/28/2014  10/01/2020: HbA1c 7.7%  Pt is on a regimen of: - Metformin ER 500 mg once a day (decreased by PCP in 09/2018) >> increase back to twice a day 12/2018. Diarrhea with higher doses. - Januvia 100 mg daily in am >> Jardiance 10 mg before breakfast >> 25 mg (increased 10/01/2020)  Pt checks her sugars 2x a day per review of her log: - am: 76-122 >> 81-120 >> 99-123, 174 >> 89-117, 129 >> 78-121, 139 - 2h after b'fast: 124, 131-163, 182 >> 105-170 >> 103-160, 170 >> 130-167 - before lunch:  79-98, 118 >> 79-101, 121 >> 90-122 >> 89-126 >> 78-100 - 2h after lunch: 145-171 >>  140-171 >> 139-181 >> 122, 140-180 >> 130-171 - before dinner: 84-119 >> 79-111 >> 91-128 >> 86-126, 136 >> 78-110, 120 - 2h after dinner:  141-169, 180 >> 137-170 >> 137-163, 190>> 144-178 - bedtime: 89-123 >> 92-115 >> 91-122 >> 98-130 >> 99-126 >> 90-121 - nighttime: n/c >> n/c Lowest sugar was 38 ... 76 >> 74 >> 90 >> 86 >> 78; she has hypoglycemia awareness in the 50s. Highestin: 177 >> 187 >> 190 >> 181 >> 191 >> 178.  Glucometer: FreeStyle >> [facility]  Pt's meals are: - snack:  1/2 yoghurt, PB + bread - Breakfast: wheat waffle + sugar free syrup or wheat muffin w/ 1 egg  - Lunch: salads or [place] sandwich on wheat bread >> banana sandwich with light mayo - Dinner: soups, salad, meat 2x a weekly (chicken, salmon) + veggies - Snacks: pineapple, celery  She continues to exercise 4-5 times a weeks on the treadmill.  -No CKD, last BUN/creatinine:  10/01/2020: Glucose 128, BUN/creatinine 13/0.68, GFR 96 Lab Results  Component Value Date   BUN 11 03/15/2020   CREATININE 0.73 03/15/2020  On lisinopril 2.5.  -+ HL; last set of lipids: 10/01/2020: 137/77/53/69 Lab Results  Component Value Date   CHOL 114 03/15/2020   HDL 38 (L) 03/15/2020   LDLCALC 56 03/15/2020   TRIG 107 03/15/2020   CHOLHDL 3.0 03/15/2020  On Lipitor 80,  fish oil.  - last eye exam was 01/28/2020: No DR; [facility]. Coming up. - + Numbness and tingling and occasional burning in her feet.  On low-dose Neurontin and B complex. These help.  She had a NSTEMI in 08/2017.  At that time, she had a catheterization and had 90% blockage on LAD.  She had a stent placed.  She has HTN and controlled hypothyroidism. In 10/01/2020 she had a very low vitamin D of 14.6.  She was started on supplementation - Ergocalciferol 50,000 units 2x a day.  Latest TSH was normal: 10/01/2020: TSH 2.4 Lab Results  Component Value Date   TSH 1.470 03/15/2020   ROS: + see HPI Neurological: no tremors/+ numbness/+ tingling/no  dizziness  I reviewed pt's medications, allergies, PMH, social hx, family hx, and changes were documented in the history of present illness. Otherwise, unchanged from my initial visit note.  Past Medical History:  Diagnosis Date   DM type 2 (diabetes mellitus, type 2) (HCC)    History of hiatal hernia    Hyperlipidemia    Hypertension    Hypothyroidism    Past Surgical History:  Procedure Laterality Date   ABDOMINAL HYSTERECTOMY  1995   CARDIAC CATHETERIZATION     CORONARY STENT INTERVENTION N/A 09/07/2017   Procedure: CORONARY STENT INTERVENTION;  Surgeon: Runell Gess, MD;  Location: [facility];  Service: Cardiovascular;  Laterality: N/A;   LEFT HEART CATH AND CORONARY ANGIOGRAPHY N/A 09/07/2017   Procedure: LEFT HEART CATH AND CORONARY ANGIOGRAPHY;  Surgeon: Runell Gess, MD;  Location: [facility];  Service: Cardiovascular;  Laterality: N/A;   STAPEDECTOMY Right ~ 2002   Social History   Social History   Marital Status: Married    Spouse Name: N/A   Number of Children: 0   Years of Education: 12   Occupational History   property asst. Production designer, theatre/television/film    Social History Main Topics   Smoking status: Current Every Day Smoker -- 0.50 packs/day for 10 years    Types: Cigarettes   Smokeless tobacco: Not on file     Comment: pt states thinking of quitting   Alcohol Use: No   Drug Use: No   Social History Narrative   Exercise: walking, 4 times/week for 30 minutes.   Current Outpatient Medications on File Prior to Visit  Medication Sig Dispense Refill   aspirin EC 81 MG EC tablet Take 1 tablet (81 mg total) by mouth daily.     atorvastatin (LIPITOR) 80 MG tablet TAKE 1 TABLET BY MOUTH EVERY DAY 90 tablet 3   clopidogrel (PLAVIX) 75 MG tablet Take 1 tablet (75 mg total) by mouth daily. 90 tablet 3   ergocalciferol (VITAMIN D2) 1.25 MG (50000 UT) capsule      fluticasone (FLONASE) 50 MCG/ACT nasal spray Place 2 sprays into both nostrils daily. 16 g 6    gabapentin (NEURONTIN) 100 MG capsule Take 1 capsule (100 mg total) by mouth at bedtime. 90 capsule 3   JARDIANCE 25 MG TABS tablet Take 25 mg by mouth daily.     levothyroxine (SYNTHROID) 50 MCG tablet Take 1 tablet (50 mcg total) by mouth daily. 90 tablet 3   metFORMIN (GLUCOPHAGE-XR) 500 MG 24 hr tablet TAKE 1 TABLET BY MOUTH TWICE DAILY WITH MEALS AS DIRECTED 180 tablet 3   nitroGLYCERIN (NITROSTAT) 0.4 MG SL tablet Place 1 tablet (0.4 mg total) under the tongue every 5 (five) minutes x 3 doses as needed for chest pain.  25 tablet 3   No current facility-administered medications on file prior to visit.   Allergies  Allergen Reactions   Aspirin Other (See Comments)    Burns stomach   Codeine Rash   Family History  Problem Relation Age of Onset   Diabetes Mother    Heart disease Mother    Stroke Father        2011   Hyperlipidemia Father    Diabetes Sister    Heart disease Sister    Heart disease Brother        congenital   PE: BP 130/70 (BP Location: Right Arm, Patient Position: Sitting, Cuff Size: Normal)   Pulse 60   Ht 5\' 3"  (1.6 m)   Wt 123 lb 9.6 oz (56.1 kg)   SpO2 97%   BMI 21.89 kg/m  Body mass index is 21.89 kg/m. Wt Readings from Last 3 Encounters:  06/04/21 123 lb 9.6 oz (56.1 kg)  01/30/21 117 lb 9.6 oz (53.3 kg)  10/10/20 114 lb 3.2 oz (51.8 kg)   Constitutional: normal weight, in NAD Eyes: PERRLA, EOMI, no exophthalmos ENT: moist mucous membranes, no thyromegaly, no cervical lymphadenopathy Cardiovascular: RRR, No MRG Respiratory: CTA B Musculoskeletal: no deformities, strength intact in all 4 Skin: moist, warm, no rashes Neurological: no tremor with outstretched hands, DTR normal in all 4  ASSESSMENT: 1. DM2, non-insulin-dependent, uncontrolled, with complications - CAD, s/p NSTEMI 2019, s/p stent - PN   No family history of medullary thyroid cancer or personal history of pancreatitis.  2. PN   3. HL  PLAN:  1. Patient with history of  longstanding, uncontrolled, type 2 diabetes, with improved control after her NSTEMI + stent placement in 08/2017.  We started Jardiance after this, mostly for the cardiovascular benefits.  She tolerates this well.  Of note, she could not tolerate higher doses of metformin due to diarrhea.  At last visit, sugars were improved and mostly at goal with only few hyperglycemic exceptions, most after having sweets or other carbs.  HbA1c was lower, at 7.3%.  We did not change her regimen at that time. - at this visit, sugars are all at goal, but HbA1c is higher than expected (see below).  Therefore, at today's visit, we checked her blood sugars in the office 3x and they ranged between 233-250, unexpectedly high especially as she is fasting...  When she left home, she checked her blood sugar with her glucometer and this showed 139.  Therefore, at this visit, we discussed about adding Ozempic at a low dose and increase as tolerated.  I advised her about benefits and possible side effects of the medication.  However, I advised her to hold off adding this until we get venous blood work and also a fructosamine level. - I suggested to:  Patient Instructions  Please continue: - Metformin ER 500 mg 2x a day with meals - Jardiance 25 mg before breakfast  Please start Ozempic 0.25 mg weekly in a.m. (for example on Sunday morning) x 4 weeks, then increase to 0.5 mg weekly in a.m. if no nausea or hypoglycemia.  Please stop at the lab.  Please return in 2 months with your sugar log.  - we checked her HbA1c: 7.8% (higher) - advised to check sugars at different times of the day - 1x a day, rotating check times - advised for yearly eye exams >> she is not UTD but has an appointment scheduled - return to clinic in 2 months   2. Diabetic PN -  stable -Continues on low-dose Neurontin, 100 mg daily and B complex  3. HL -Reviewed latest lipid panel from 09/2020: All fractions at goal -Continues fish oil and Lipitor 80 mg  daily without side effects  Component     Latest Ref Rng & Units 06/04/2021  Sodium     135 - 145 mEq/L 141  Potassium     3.5 - 5.1 mEq/L 4.6  Chloride     96 - 112 mEq/L 105  CO2     19 - 32 mEq/L 27  Glucose     70 - 99 mg/dL 263 (H)  BUN     6 - 23 mg/dL 16  Creatinine     7.85 - 1.20 mg/dL 8.85   GFR     >02.77 mL/min 80.43  Calcium     8.4 - 10.5 mg/dL 9.9  Hemoglobin A1O     4.0 - 5.6 % 7.8 (A)  Fructosamine     205 - 285 umol/L 305 (H)  Glucose is high, correlating with the value obtained by fingerstick. HbA1c calculated from fructosamine is better, at 6.8%.  Carlus Pavlov, MD PhD [facility] Endocrinology

## 2021-06-04 NOTE — Patient Instructions (Addendum)
Please continue: - Metformin ER 500 mg 2x a day with meals - Jardiance 25 mg before breakfast  Please start Ozempic 0.25 mg weekly in a.m. (for example on Sunday morning) x 4 weeks, then increase to 0.5 mg weekly in a.m. if no nausea or hypoglycemia.  Please stop at the lab.  Please return in 2 months with your sugar log.

## 2021-06-05 ENCOUNTER — Encounter: Payer: Self-pay | Admitting: Internal Medicine

## 2021-06-07 LAB — FRUCTOSAMINE: Fructosamine: 305 umol/L — ABNORMAL HIGH (ref 205–285)

## 2021-06-08 ENCOUNTER — Encounter: Payer: Self-pay | Admitting: Internal Medicine

## 2021-06-08 LAB — HM DIABETES EYE EXAM

## 2021-08-08 ENCOUNTER — Other Ambulatory Visit: Payer: Self-pay

## 2021-08-08 ENCOUNTER — Encounter: Payer: Self-pay | Admitting: Internal Medicine

## 2021-08-08 ENCOUNTER — Ambulatory Visit: Payer: BC Managed Care – PPO | Admitting: Internal Medicine

## 2021-08-08 VITALS — BP 120/82 | HR 60 | Ht 63.0 in | Wt 118.4 lb

## 2021-08-08 DIAGNOSIS — E785 Hyperlipidemia, unspecified: Secondary | ICD-10-CM | POA: Diagnosis not present

## 2021-08-08 DIAGNOSIS — E1159 Type 2 diabetes mellitus with other circulatory complications: Secondary | ICD-10-CM

## 2021-08-08 DIAGNOSIS — E1142 Type 2 diabetes mellitus with diabetic polyneuropathy: Secondary | ICD-10-CM | POA: Diagnosis not present

## 2021-08-08 DIAGNOSIS — E1165 Type 2 diabetes mellitus with hyperglycemia: Secondary | ICD-10-CM

## 2021-08-08 MED ORDER — INSULIN PEN NEEDLE 32G X 4 MM MISC: 3 refills | Status: DC

## 2021-08-08 MED ORDER — FIASP FLEXTOUCH 100 UNIT/ML ~~LOC~~ SOPN: 2.0000 [IU] | PEN_INJECTOR | Freq: Two times a day (BID) | SUBCUTANEOUS | 3 refills | Status: DC

## 2021-08-08 NOTE — Patient Instructions (Addendum)
Please continue: - Metformin ER 500 mg 2x a day with meals - Jardiance 25 mg before breakfast  Please start: - FiAsp 2-4 units right before b'fast and dinner  Please return in 1.5 months.

## 2021-08-08 NOTE — Progress Notes (Addendum)
He has patient ID: Carmen Anderson, female   DOB: 21-Sep-1960, 61 y.o.   MRN: [id number]   This visit occurred during the SARS-CoV-2 public health emergency.  Safety protocols were in place, including screening questions prior to the visit, additional usage of staff PPE, and extensive cleaning of exam room while observing appropriate contact time as indicated for disinfecting solutions.   HPI: Carmen Anderson is a 61 y.o.-year-old female, initially referred by her PCP, Carmen Ree, NP, returning for follow-up for DM2, dx in 2001-2002, non-insulin-dependent, now more controlled, with complications (CAD - s/p NSTEMI 2019, s/p stent; PN). Last visit 2 months ago.  Interim history: No increased urination, blurry vision, nausea, chest pain.  Reviewed HbA1c levels: 06/04/2021: HbA1c calculated from fructosamine 6.8% Lab Results  Component Value Date   HGBA1C 7.8 (A) 06/04/2021   HGBA1C 7.3 (A) 01/30/2021   HGBA1C 7.1 (A) 06/04/2020   HGBA1C 7.0 (A) 01/27/2020   HGBA1C 7.4 (A) 09/22/2019   HGBA1C 6.4 (A) 05/18/2019   HGBA1C 7.6 (A) 12/15/2018   HGBA1C 6.7 (A) 08/16/2018   HGBA1C 6.9 (A) 04/13/2018   HGBA1C 6.6 (A) 12/14/2017   HGBA1C 6.9 (H) 09/06/2017   HGBA1C 7 08/14/2017   HGBA1C 6.8 04/13/2017   HGBA1C 6.7 11/25/2016   HGBA1C 7.0 06/03/2016   HGBA1C 7.0 03/04/2016   HGBA1C 7.0 12/03/2015   HGBA1C 7.9 (H) 09/05/2015   HGBA1C 7.7 (H) 05/09/2015   HGBA1C 7.0 10/27/2014  10/01/2020: HbA1c 7.7%  Pt is on a regimen of: - Metformin ER 500 mg once a day (decreased by PCP in 09/2018) >> increase back to twice a day 12/2018. Diarrhea with higher doses. - Januvia 100 mg daily in am >> Jardiance 10 mg before breakfast >> 25 mg (increased 10/01/2020) We tried Ozempic  0.25  mg weekly - added 05/2021 >> severe nausea >> had to stop.  Pt checks her sugars >4x a day with her new Freestyle Libre CGM:   Prev.: - am: 99-123, 174 >> 89-117, 129 >> 78-121, 139 - 2h after b'fast: 105-170 >> 103-160,  170 >> 130-167 - before lunch:   90-122 >> 89-126 >> 78-100 - 2h after lunch: 139-181 >> 122, 140-180 >> 130-171 - before dinner: 91-128 >> 86-126, 136 >> 78-110, 120 - 2h after dinner: 137-170 >> 137-163, 190>> 144-178 - bedtime: 91-122 >> 98-130 >> 99-126 >> 90-121 - nighttime: n/c >> n/c Lowest sugar was 38 ... >> 86 >> 78 >> 58 (night); she has hypoglycemia awareness in the 50s. Highestin: 191 >> 178 >> 300s.  Glucometer: FreeStyle >> [facility]  Pt's meals are: - snack:  1/2 yoghurt, PB + bread - Breakfast: wheat waffle + sugar free syrup or wheat muffin w/ 1 egg  - Lunch: salads or [place] sandwich on wheat bread >> banana sandwich with light mayo - Dinner: soups, salad, meat 2x a weekly (chicken, salmon) + veggies - Snacks: pineapple, celery  She continues to exercise 4-5 times a weeks on the treadmill.  -No CKD, last BUN/creatinine:  10/01/2020: Glucose 128, BUN/creatinine 13/0.68, GFR 96 Lab Results  Component Value Date   BUN 16 06/04/2021   CREATININE 0.80 06/04/2021  On lisinopril 2.5.  -+ HL; last set of lipids: 10/01/2020: 137/77/53/69 Lab Results  Component Value Date   CHOL 114 03/15/2020   HDL 38 (L) 03/15/2020   LDLCALC 56 03/15/2020   TRIG 107 03/15/2020   CHOLHDL 3.0 03/15/2020  On Lipitor 80, fish oil.  - last eye exam was 05/2021:  No DR; [facility].   - + Numbness and tingling and occasional burning in her feet.  On low-dose Neurontin and B complex. These help.  She had a NSTEMI in 08/2017.  At that time, she had a catheterization and had 90% blockage on LAD.  She had a stent placed.  She has HTN and controlled hypothyroidism. In 10/01/2020 she had a very low vitamin D of 14.6.  She was started on supplementation - Ergocalciferol 50,000 units 2x a day.  Latest TSH was normal: 10/01/2020: TSH 2.4 Lab Results  Component Value Date   TSH 1.470 03/15/2020   ROS: + see HPI Neurological: no tremors/+ numbness/+ tingling/no dizziness  I reviewed pt's  medications, allergies, PMH, social hx, family hx, and changes were documented in the history of present illness. Otherwise, unchanged from my initial visit note.  Past Medical History:  Diagnosis Date   DM type 2 (diabetes mellitus, type 2) (HCC)    History of hiatal hernia    Hyperlipidemia    Hypertension    Hypothyroidism    Past Surgical History:  Procedure Laterality Date   ABDOMINAL HYSTERECTOMY  1995   CARDIAC CATHETERIZATION     CORONARY STENT INTERVENTION N/A 09/07/2017   Procedure: CORONARY STENT INTERVENTION;  Surgeon: Runell GessBerry, Carmen J, MD;  Location: [facility];  Service: Cardiovascular;  Laterality: N/A;   LEFT HEART CATH AND CORONARY ANGIOGRAPHY N/A 09/07/2017   Procedure: LEFT HEART CATH AND CORONARY ANGIOGRAPHY;  Surgeon: Runell GessBerry, Carmen J, MD;  Location: [facility];  Service: Cardiovascular;  Laterality: N/A;   STAPEDECTOMY Right ~ 2002   Social History   Social History   Marital Status: Married    Spouse Name: N/A   Number of Children: 0   Years of Education: 12   Occupational History   property asst. Production designer, theatre/television/filmmanager    Social History Main Topics   Smoking status: Current Every Day Smoker -- 0.50 packs/day for 10 years    Types: Cigarettes   Smokeless tobacco: Not on file     Comment: pt states thinking of quitting   Alcohol Use: No   Drug Use: No   Social History Narrative   Exercise: walking, 4 times/week for 30 minutes.   Current Outpatient Medications on File Prior to Visit  Medication Sig Dispense Refill   aspirin EC 81 MG EC tablet Take 1 tablet (81 mg total) by mouth daily.     atorvastatin (LIPITOR) 80 MG tablet TAKE 1 TABLET BY MOUTH EVERY DAY 90 tablet 3   clopidogrel (PLAVIX) 75 MG tablet Take 1 tablet (75 mg total) by mouth daily. 90 tablet 3   Continuous Blood Gluc Sensor (FREESTYLE LIBRE 3 SENSOR) MISC 1 each by Does not apply route every 14 (fourteen) days. 6 each 3   ergocalciferol (VITAMIN D2) 1.25 MG (50000 UT) capsule       fluticasone (FLONASE) 50 MCG/ACT nasal spray Place 2 sprays into both nostrils daily. 16 g 6   gabapentin (NEURONTIN) 100 MG capsule Take 1 capsule (100 mg total) by mouth at bedtime. 90 capsule 3   JARDIANCE 25 MG TABS tablet Take 25 mg by mouth daily.     levothyroxine (SYNTHROID) 50 MCG tablet Take 1 tablet (50 mcg total) by mouth daily. 90 tablet 3   metFORMIN (GLUCOPHAGE-XR) 500 MG 24 hr tablet TAKE 1 TABLET BY MOUTH TWICE DAILY WITH MEALS AS DIRECTED 180 tablet 3   nitroGLYCERIN (NITROSTAT) 0.4 MG SL tablet Place 1 tablet (0.4 mg  total) under the tongue every 5 (five) minutes x 3 doses as needed for chest pain. 25 tablet 3   Semaglutide,0.25 or 0.5MG /DOS, (OZEMPIC, 0.25 OR 0.5 MG/DOSE,) 2 MG/1.5ML SOPN Inject 0.5 mg into the skin once a week. 4.5 mL 3   No current facility-administered medications on file prior to visit.   Allergies  Allergen Reactions   Aspirin Other (See Comments)    Burns stomach   Codeine Rash   Family History  Problem Relation Age of Onset   Diabetes Mother    Heart disease Mother    Stroke Father        2011   Hyperlipidemia Father    Diabetes Sister    Heart disease Sister    Heart disease Brother        congenital   PE: BP 120/82 (BP Location: Right Arm, Patient Position: Sitting, Cuff Size: Normal)    Pulse 60    Ht 5\' 3"  (1.6 m)    Wt 118 lb 6.4 oz (53.7 kg)    SpO2 98%    BMI 20.97 kg/m   Wt Readings from Last 3 Encounters:  08/08/21 118 lb 6.4 oz (53.7 kg)  06/04/21 123 lb 9.6 oz (56.1 kg)  01/30/21 117 lb 9.6 oz (53.3 kg)   Constitutional: normal weight, in NAD Eyes: PERRLA, EOMI, no exophthalmos ENT: moist mucous membranes, no thyromegaly, no cervical lymphadenopathy Cardiovascular: RRR, No MRG Respiratory: CTA B Musculoskeletal: no deformities, strength intact in all 4 Skin: moist, warm, no rashes Neurological: no tremor with outstretched hands, DTR normal in all 4  Diabetic Foot Exam - Simple   Simple Foot Form Diabetic Foot exam  was performed with the following findings: Yes 08/08/2021  9:16 AM  Visual Inspection See comments: Yes Sensation Testing Intact to touch and monofilament testing bilaterally: Yes Pulse Check Posterior Tibialis and Dorsalis pulse intact bilaterally: Yes Comments Onychodystrophy 1st 2 toes bilaterally    ASSESSMENT: 1. DM2, non-insulin-dependent, uncontrolled, with complications - CAD, s/p NSTEMI 2019, s/p stent - PN   No family history of medullary thyroid cancer or personal history of pancreatitis.  2. PN   3. HL  PLAN:  1. Patient with history of longstanding, uncontrolled, type 2 diabetes, with improved control after her NSTEMI + stent placement in 08/2017.  We started Jardiance after this, mostly for the cardiovascular benefits.  She tolerates this well.  Of note, she could not tolerate higher doses of metformin due to diarrhea.  At last visit, sugars were at goal but HbA1c was higher than expected.  We checked a fructosamine level and the HbA1c calculated from fructosamine was correlating with her blood sugars at home, and 6.8%.   - We did end up starting Ozempic at a low dose and I advised her to increase as tolerated.  I explained that this would not only help with blood sugars but also help maintain her weight and has renal and cardiovascular benefits.  Unfortunately, however, she developed severe nausea from it and could not continue. CGM interpretation: -At today's visit, we reviewed her CGM downloads: It appears that 83% of values are in target range (goal >70%), while 17% are higher than 180 (goal <25%), and 0% are lower than 70 (goal <4%).  The calculated average blood sugar is 137.  The projected HbA1c for the next 3 months (GMI) is 6.6%. -Reviewing the CGM trends, it appears that her sugars are excellently controlled during the night in between meals, but they are significantly high after meals.  The increase is rapid and she is also dropping her sugars rapidly after the  postprandial peaks.  I explained that this is a sign of insufficient insulin production from the pancreas, which initially just affects mealtime insulin secretion.  Therefore, especially since she could not tolerate a GLP-1 receptor agonist, I did suggest to add rapid acting insulin before breakfast and dinner, since the hyperglycemic peaks are higher after these meals.  We discussed about how to vary the dose of insulin based on the size of the meal and also blood sugars before the meal.  She agrees with this plan.  I do not feel that she needs long-acting insulin for now. - at today's visit, we plan to check her insulin production and MR pancreatic antibodies.  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 - I suggested to:  Patient Instructions  Please continue: - Metformin ER 500 mg 2x a day with meals - Jardiance 25 mg before breakfast  Please start: - FiAsp 2-4 units right before b'fast and dinner  Please return in 1.5 months.  - advised to check sugars at different times of the day - 4x a day, rotating check times - advised for yearly eye exams >> she is UTD - foot exam performed today - return to clinic in 3 months   2. Diabetic PN -Stable -She continues on low-dose Neurontin, 100 mg daily and also B complex  3. HL - Reviewed latest lipid panel from: 10/01/2020: 137/77/53/69 -all fractions at goal - Continues fish oil and Lipitor 80 mg daily without side effects  Component     Latest Ref Rng & Units 08/08/2021  Glucose     65 - 99 mg/dL 124 (H)  C-Peptide     0.80 - 3.85 ng/mL 3.18  Islet Cell Ab     Neg:<1:1 Negative  ZNT8 Antibodies     <15 U/mL <10  Glutamic Acid Decarb Ab     <5 IU/mL <5  Interestingly, the tests are negative for autoimmunity or insulin deficiency.  Carlus Pavlov, MD PhD [facility] Endocrinology

## 2021-08-09 LAB — ANTI-ISLET CELL ANTIBODY: Islet Cell Ab: NEGATIVE

## 2021-08-15 LAB — C-PEPTIDE: C-Peptide: 3.18 ng/mL (ref 0.80–3.85)

## 2021-08-15 LAB — GLUCOSE, FASTING: Glucose, Bld: 142 mg/dL — ABNORMAL HIGH (ref 65–99)

## 2021-08-15 LAB — ZNT8 ANTIBODIES: ZNT8 Antibodies: <10 U/mL (ref <15)

## 2021-08-15 LAB — GLUTAMIC ACID DECARBOXYLASE AUTO ABS: Glutamic Acid Decarb Ab: <5 IU/mL (ref <5)

## 2021-09-10 NOTE — Progress Notes (Signed)
Cardiology Office Note:    Date:  09/11/2021   ID:  Carmen Anderson, DOB Dec 17, 1960, MRN [id number]  PCP:  Carmen Davenport, MD [facility] Cardiologist: Carmen Guadalajara, MD   Reason for visit: 1 year follow-up  History of Present Illness:    Carmen Anderson is a 61 y.o. female with a hx of hyperlipidemia, diabetes, hypothyroidism, CAD with NSTEMI in 2019 status post DES to the LAD, history of tobacco use (quit in 2019).  She last saw Dr. Tresa Endo in March 2022 and was doing well.  Today, she is doing well.  She mentions she started insulin month and a half ago.  She was unable to tolerate Ozempic due to GI upset.  She has been a diabetic for the past 18 years.  From a heart standpoint, she denies chest pain and shortness of breath.  She states when she had her NSTEMI 2019, she felt heavy chest heaviness radiating to her left arm with associated shortness of breath.  She has not felt similar symptoms since.  She denies PND, orthopnea, lower extremity edema, palpitations and syncope.  She states she had a history of hypotension on lisinopril in the past.  She denies history of TIA and stroke.  No significant bleeding on Plavix and aspirin.   She retired from the housing authority in 2020.  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  She walks when the weather is nice for exercise.    Past Medical History:  Diagnosis Date   DM type 2 (diabetes mellitus, type 2) (HCC)    History of hiatal hernia    Hyperlipidemia    Hypertension    Hypothyroidism     Past Surgical History:  Procedure Laterality Date   ABDOMINAL HYSTERECTOMY  1995   CARDIAC CATHETERIZATION     CORONARY STENT INTERVENTION N/A 09/07/2017   Procedure: CORONARY STENT INTERVENTION;  Surgeon: Carmen Gess, MD;  [facility]: [facility];  Service: Cardiovascular;  Laterality: N/A;   LEFT HEART CATH AND CORONARY ANGIOGRAPHY N/A 09/07/2017   Procedure: LEFT HEART CATH AND CORONARY ANGIOGRAPHY;   Surgeon: Carmen Gess, MD;  Location: [facility];  Service: Cardiovascular;  Laterality: N/A;   STAPEDECTOMY Right ~ 2002    Current Medications: Current Meds  Medication Sig   aspirin EC 81 MG EC tablet Take 1 tablet (81 mg total) by mouth daily.   Continuous Blood Gluc Sensor (FREESTYLE LIBRE 3 SENSOR) MISC 1 each by Does not apply route every 14 (fourteen) days.   ergocalciferol (VITAMIN D2) 1.25 MG (50000 UT) capsule    fluticasone (FLONASE) 50 MCG/ACT nasal spray Place 2 sprays into both nostrils daily.   gabapentin (NEURONTIN) 100 MG capsule Take 1 capsule (100 mg total) by mouth at bedtime.   insulin aspart (FIASP FLEXTOUCH) 100 UNIT/ML FlexTouch Pen Inject 2-4 Units into the skin 2 (two) times daily before a meal.   Insulin Pen Needle 32G X 4 MM MISC Use 2x a day   JARDIANCE 25 MG TABS tablet Take 25 mg by mouth daily.   levothyroxine (SYNTHROID) 50 MCG tablet Take 1 tablet (50 mcg total) by mouth daily.   metFORMIN (GLUCOPHAGE-XR) 500 MG 24 hr tablet TAKE 1 TABLET BY MOUTH TWICE DAILY WITH MEALS AS DIRECTED   nitroGLYCERIN (NITROSTAT) 0.4 MG SL tablet Place 1 tablet (0.4 mg total) under the tongue every 5 (five) minutes x 3 doses as needed for chest pain.   [DISCONTINUED] atorvastatin (LIPITOR)  80 MG tablet TAKE 1 TABLET BY MOUTH EVERY DAY   [DISCONTINUED] clopidogrel (PLAVIX) 75 MG tablet Take 1 tablet (75 mg total) by mouth daily.     Allergies:   Aspirin and Codeine   Social History   Socioeconomic History   Marital status: Married    Spouse name: Not on file   Number of children: Not on file   Years of education: 12   Highest education level: Not on file  Occupational History   Occupation: property asst. Event organiser: [employer]  Tobacco Use   Smoking status: Former    Packs/day: 0.50    Years: 27.00    Pack years: 13.50    Types: Cigarettes    Quit date: 09/05/2017    Years since quitting: 4.0   Smokeless tobacco: Never   Vaping Use   Vaping Use: Never used  Substance and Sexual Activity   Alcohol use: No   Drug use: No   Sexual activity: Yes    Birth control/protection: None  Other Topics Concern   Not on file  Social History Narrative   Exercise: walking, 4 times/week for 30 minutes.   Social Determinants of Health   Financial Resource Strain: Not on file  Food Insecurity: Not on file  Transportation Needs: Not on file  Physical Activity: Not on file  Stress: Not on file  Social Connections: Not on file     Family History: The patient's family history includes Diabetes in her mother and sister; Heart disease in her brother, mother, and sister; Hyperlipidemia in her father; Stroke in her father.  ROS:   Please see the history of present illness.     EKGs/Labs/Other Studies Reviewed:    EKG:  The ekg ordered today demonstrates sinus bradycardia, heart rate 57, abnormal T wave in lead III.  Recent Labs: 06/04/2021: BUN 16; Creatinine, Ser 0.80; Potassium 4.6; Sodium 141   Recent Lipid Panel Lab Results  Component Value Date/Time   CHOL 114 03/15/2020 12:32 PM   TRIG 107 03/15/2020 12:32 PM   HDL 38 (L) 03/15/2020 12:32 PM   LDLCALC 56 03/15/2020 12:32 PM    Physical Exam:    VS:  BP 134/72    Pulse (!) 57    Ht 5\' 3"  (1.6 m)    Wt 120 lb 6.4 oz (54.6 kg)    SpO2 99%    BMI 21.33 kg/m    No data found.  Wt Readings from Last 3 Encounters:  09/11/21 120 lb 6.4 oz (54.6 kg)  08/08/21 118 lb 6.4 oz (53.7 kg)  06/04/21 123 lb 9.6 oz (56.1 kg)     GEN:  Well nourished, well developed in no acute distress HEENT: Normal NECK: No JVD; + carotid bruits bilaterally CARDIAC: RRR, no murmurs, rubs, gallops RESPIRATORY:  Clear to auscultation without rales, wheezing or rhonchi  ABDOMEN: Soft, non-tender, non-distended MUSCULOSKELETAL: No edema; No deformity  SKIN: Warm and dry NEUROLOGIC:  Alert and oriented PSYCHIATRIC:  Normal affect     ASSESSMENT AND PLAN   Coronary artery  disease with no angina -Status post DES to the LAD in 2019 -Continue statin therapy. -As a diabetic, continue DAPT since she has had no bleeding issues. -Not on a beta-blocker secondary to breast resting bradycardia -History of hypotension on lisinopril.  Carotid bruits -No history of TIA or stroke.  No prior carotid duplex. -Order carotid duplex.   -Continue antiplatelet and statin therapy  Insulin-dependent diabetes -Hemoglobin A1c 7.8% in  November 2022.  Recent start insulin.  She is followed by Dr. Elvera Lennox with [facility] end[facility]inology and has a follow-up on March 9.  Hyperlipidemia with goal LDL <70 -LDL was 56 in September 2021. -Recheck fasting lipids this week. -Continue Lipitor 80 mg daily.  Disposition - Follow-up in 1 year with Dr. Tresa Endo.   Medication Adjustments/Labs and Tests Ordered: Current medicines are reviewed at length with the patient today.  Concerns regarding medicines are outlined above.  Orders Placed This Encounter  Procedures   Lipid panel   EKG 12-Lead   VAS US CAROTID   Meds ordered this encounter  Medications   atorvastatin (LIPITOR) 80 MG tablet    Sig: Take 1 tablet (80 mg total) by mouth daily.    Dispense:  90 tablet    Refill:  3   clopidogrel (PLAVIX) 75 MG tablet    Sig: Take 1 tablet (75 mg total) by mouth daily.    Dispense:  90 tablet    Refill:  3    Patient Instructions  Medication Instructions:  No Changes *If you need a refill on your cardiac medications before your next appointment, please call your pharmacy*   Lab Work: Lipid Panel If you have labs (blood work) drawn today and your tests are completely normal, you will receive your results only by: MyChart Message (if you have MyChart) OR A paper copy in the mail If you have any lab test that is abnormal or we need to change your treatment, we will call you to review the results.   Testing/Procedures: [address], [address]. Your physician has requested that  you have a carotid duplex. This test is an ultrasound of the carotid arteries in your neck. It looks at blood flow through these arteries that supply the brain with blood. Allow one hour for this exam. There are no restrictions or special instructions.    Follow-Up: At [facility], you and your health needs are our priority.  As part of our continuing mission to provide you with exceptional heart care, we have created designated Provider Care Teams.  These Care Teams include your primary Cardiologist (physician) and Advanced Practice Providers (APPs -  Physician Assistants and Nurse Practitioners) who all work together to provide you with the care you need, when you need it.  We recommend signing up for the patient portal called "MyChart".  Sign up information is provided on this After Visit Summary.  MyChart is used to connect with patients for Virtual Visits (Telemedicine).  Patients are able to view lab/test results, encounter notes, upcoming appointments, etc.  Non-urgent messages can be sent to your provider as well.   To learn more about what you can do with MyChart, go to [email or web address].    Your next appointment:   1 year(s)  The format for your next appointment:   In Person  Provider:   Nicki Guadalajara, MD        Signed, Cannon Kettle, PA-C  09/11/2021 8:43 AM    [facility]

## 2021-09-11 ENCOUNTER — Ambulatory Visit: Payer: BC Managed Care – PPO | Admitting: Physician Assistant

## 2021-09-11 ENCOUNTER — Encounter: Payer: Self-pay | Admitting: Physician Assistant

## 2021-09-11 ENCOUNTER — Other Ambulatory Visit: Payer: Self-pay

## 2021-09-11 ENCOUNTER — Other Ambulatory Visit: Payer: Self-pay | Admitting: Physician Assistant

## 2021-09-11 VITALS — BP 134/72 | HR 57 | Ht 63.0 in | Wt 120.4 lb

## 2021-09-11 DIAGNOSIS — E1159 Type 2 diabetes mellitus with other circulatory complications: Secondary | ICD-10-CM | POA: Diagnosis not present

## 2021-09-11 DIAGNOSIS — R0989 Other specified symptoms and signs involving the circulatory and respiratory systems: Secondary | ICD-10-CM | POA: Diagnosis not present

## 2021-09-11 DIAGNOSIS — I2510 Atherosclerotic heart disease of native coronary artery without angina pectoris: Secondary | ICD-10-CM | POA: Diagnosis not present

## 2021-09-11 DIAGNOSIS — E785 Hyperlipidemia, unspecified: Secondary | ICD-10-CM | POA: Diagnosis not present

## 2021-09-11 DIAGNOSIS — I214 Non-ST elevation (NSTEMI) myocardial infarction: Secondary | ICD-10-CM

## 2021-09-11 MED ORDER — CLOPIDOGREL BISULFATE 75 MG PO TABS: 75.0000 mg | ORAL_TABLET | Freq: Every day | ORAL | 3 refills | Status: DC

## 2021-09-11 MED ORDER — ATORVASTATIN CALCIUM 80 MG PO TABS: 80.0000 mg | ORAL_TABLET | Freq: Every day | ORAL | 3 refills | Status: DC

## 2021-09-11 NOTE — Patient Instructions (Signed)
Medication Instructions:  ?No Changes ?*If you need a refill on your cardiac medications before your next appointment, please call your pharmacy* ? ? ?Lab Work: ?Lipid Panel ?If you have labs (blood work) drawn today and your tests are completely normal, you will receive your results only by: ?MyChart Message (if you have MyChart) OR ?A paper copy in the mail ?If you have any lab test that is abnormal or we need to change your treatment, we will call you to review the results. ? ? ?Testing/Procedures: [address], [address]. ?Your physician has requested that you have a carotid duplex. This test is an ultrasound of the carotid arteries in your neck. It looks at blood flow through these arteries that supply the brain with blood. Allow one hour for this exam. There are no restrictions or special instructions.  ? ? ?Follow-Up: ?At [facility], you and your health needs are our priority.  As part of our continuing mission to provide you with exceptional heart care, we have created designated Provider Care Teams.  These Care Teams include your primary Cardiologist (physician) and Advanced Practice Providers (APPs -  Physician Assistants and Nurse Practitioners) who all work together to provide you with the care you need, when you need it. ? ?We recommend signing up for the patient portal called "MyChart".  Sign up information is provided on this After Visit Summary.  MyChart is used to connect with patients for Virtual Visits (Telemedicine).  Patients are able to view lab/test results, encounter notes, upcoming appointments, etc.  Non-urgent messages can be sent to your provider as well.   ?To learn more about what you can do with MyChart, go to [email or web address].   ? ?Your next appointment:   ?1 year(s) ? ?The format for your next appointment:   ?In Person ? ?Provider:   ?Nicki Guadalajara, MD   ? ? ? ?

## 2021-09-13 ENCOUNTER — Other Ambulatory Visit: Payer: Self-pay

## 2021-09-13 ENCOUNTER — Ambulatory Visit (HOSPITAL_COMMUNITY)
Admission: RE | Admit: 2021-09-13 | Discharge: 2021-09-13 | Disposition: A | Discharge status: Home / Self Care | Payer: BC Managed Care – PPO | Source: Ambulatory Visit | Attending: Cardiology | Admitting: Cardiology

## 2021-09-13 DIAGNOSIS — R0989 Other specified symptoms and signs involving the circulatory and respiratory systems: Secondary | ICD-10-CM | POA: Insufficient documentation

## 2021-09-13 LAB — LIPID PANEL
Chol/HDL Ratio: 2.7 ratio (ref 0.0–4.4)
Cholesterol, Total: 117 mg/dL (ref 100–199)
HDL: 44 mg/dL (ref >39)
LDL Chol Calc (NIH): 60 mg/dL (ref 0–99)
Triglycerides: 62 mg/dL (ref 0–149)
VLDL Cholesterol Cal: 13 mg/dL (ref 5–40)

## 2021-09-18 ENCOUNTER — Telehealth: Payer: Self-pay

## 2021-09-18 NOTE — Telephone Encounter (Addendum)
Called patient regarding results. Left message for patient to call office.----- Message from Warren Lacy, PA-C sent at 09/16/2021 12:41 PM EST ----- ?Cholesterol levels are controlled.  Continue high dose Lipitor.   ?Your triglycerides have lowered and HDL (good cholesterol) is up -- very good. ?

## 2021-09-18 NOTE — Telephone Encounter (Addendum)
Called patient regarding results. Left message to call office.----- Message from Warren Lacy, PA-C sent at 09/16/2021 12:40 PM EST ----- ?Good news is that there is not significant blockage in the internal carotid arteries (blockage in the internal carotid arteries increases risk of stroke).   ? 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  The collateral circulation around the shoulder is usually sufficient such that flow around a focal area of stenosis in the subclavian artery is well tolerated.   ? ?You are on appropriate therapy with ASA/Plavix and lipitor.  It is important to have a well controlled diabetes and cholesterol.  No changes in therapy recommended at this time. ? ?

## 2021-09-19 ENCOUNTER — Encounter: Payer: Self-pay | Admitting: Internal Medicine

## 2021-09-19 ENCOUNTER — Ambulatory Visit: Payer: BC Managed Care – PPO | Admitting: Internal Medicine

## 2021-09-19 ENCOUNTER — Other Ambulatory Visit: Payer: Self-pay

## 2021-09-19 VITALS — BP 130/88 | HR 74 | Ht 63.0 in | Wt 119.2 lb

## 2021-09-19 DIAGNOSIS — E785 Hyperlipidemia, unspecified: Secondary | ICD-10-CM

## 2021-09-19 DIAGNOSIS — E1142 Type 2 diabetes mellitus with diabetic polyneuropathy: Secondary | ICD-10-CM | POA: Diagnosis not present

## 2021-09-19 DIAGNOSIS — E1140 Type 2 diabetes mellitus with diabetic neuropathy, unspecified: Secondary | ICD-10-CM | POA: Diagnosis not present

## 2021-09-19 LAB — POCT GLYCOSYLATED HEMOGLOBIN (HGB A1C): Hemoglobin A1C: 6.9 % — AB (ref 4.0–5.6)

## 2021-09-19 MED ORDER — JARDIANCE 25 MG PO TABS: 25.0000 mg | ORAL_TABLET | Freq: Every day | ORAL | 3 refills | Status: DC

## 2021-09-19 MED ORDER — FIASP FLEXTOUCH 100 UNIT/ML ~~LOC~~ SOPN: 4.0000 [IU] | PEN_INJECTOR | Freq: Three times a day (TID) | SUBCUTANEOUS | 3 refills | Status: DC

## 2021-09-19 MED ORDER — METFORMIN HCL ER 500 MG PO TB24: 500.0000 mg | ORAL_TABLET | Freq: Two times a day (BID) | ORAL | 3 refills | Status: DC

## 2021-09-19 NOTE — Progress Notes (Signed)
He has patient ID: Carmen Anderson, female   DOB: 06-06-1961, 61 y.o.   MRN: [id number]   This visit occurred during the SARS-CoV-2 public health emergency.  Safety protocols were in place, including screening questions prior to the visit, additional usage of staff PPE, and extensive cleaning of exam room while observing appropriate contact time as indicated for disinfecting solutions.   HPI: Carmen Anderson is a 61 y.o.-year-old female, initially referred by her PCP, Olean Ree, NP, returning for follow-up for DM2, dx in 2001-2002, insulin-dependent, now more controlled, with complications (CAD - s/p NSTEMI 2019, s/p stent; PN). Last visit 1.5 months ago.  Interim history: No increased urination, blurry vision, nausea, chest pain. He recently had carotid ultrasound: No significant blockage.  She continues ASA and Plavix.  Reviewed HbA1c levels: 06/04/2021: HbA1c calculated from fructosamine 6.8% Lab Results  Component Value Date   HGBA1C 7.8 (A) 06/04/2021   HGBA1C 7.3 (A) 01/30/2021   HGBA1C 7.1 (A) 06/04/2020   HGBA1C 7.0 (A) 01/27/2020   HGBA1C 7.4 (A) 09/22/2019   HGBA1C 6.4 (A) 05/18/2019   HGBA1C 7.6 (A) 12/15/2018   HGBA1C 6.7 (A) 08/16/2018   HGBA1C 6.9 (A) 04/13/2018   HGBA1C 6.6 (A) 12/14/2017   HGBA1C 6.9 (H) 09/06/2017   HGBA1C 7 08/14/2017   HGBA1C 6.8 04/13/2017   HGBA1C 6.7 11/25/2016   HGBA1C 7.0 06/03/2016   HGBA1C 7.0 03/04/2016   HGBA1C 7.0 12/03/2015   HGBA1C 7.9 (H) 09/05/2015   HGBA1C 7.7 (H) 05/09/2015   HGBA1C 7.0 10/27/2014  10/01/2020: HbA1c 7.7%  Pt is on a regimen of: - Metformin ER 500 mg once a day >> twice a day 12/2018. Diarrhea with higher doses. - Jardiance 10 mg before breakfast >> 25 mg (increased 10/01/2020) - FiAsp  2-4  >> 3-4 units right before b'fast and dinner - added 07/2021 We tried Ozempic  0.25  mg weekly - added 05/2021 >> severe nausea >> had to stop. She was previously on Januvia.  Pt checks her sugars >4x a day with her new  Freestyle Libre CGM:   Previously:   Lowest sugar was 38 ... >> 86 >> 78 >> 58 (night); she has hypoglycemia awareness in the 50s. Highestin: 191 >> 178 >> 300s.  Glucometer: FreeStyle >> [facility]  Pt's meals are: - snack:  1/2 yoghurt, PB + bread - Breakfast: wheat waffle + sugar free syrup or wheat muffin w/ 1 egg  - Lunch: salads or [place] sandwich on wheat bread >> banana sandwich with light mayo - Dinner: soups, salad, meat 2x a weekly (chicken, salmon) + veggies - Snacks: pineapple, celery She continues to exercise 4-5 times a weeks on the treadmill.  -No CKD, last BUN/creatinine:  Lab Results  Component Value Date   BUN 16 06/04/2021   CREATININE 0.80 06/04/2021  10/01/2020: Glucose 128, BUN/creatinine 13/0.68, GFR 96 On lisinopril 2.5.  -+ HL; last set of lipids: Lab Results  Component Value Date   CHOL 117 09/13/2021   HDL 44 09/13/2021   LDLCALC 60 09/13/2021   TRIG 62 09/13/2021   CHOLHDL 2.7 09/13/2021  10/01/2020: 137/77/53/69 On Lipitor 80, fish oil.  - last eye exam was 05/2021: No DR; [facility].   - + Numbness and tingling and occasional burning in her feet.  On low-dose Neurontin and B complex. These help.  She had a NSTEMI in 08/2017.  At that time, she had a catheterization and had 90% blockage on LAD.  She had a stent placed.  She has HTN and controlled hypothyroidism. She has a history of vitamin D deficiency.  Latest TSH was normal: 10/01/2020: TSH 2.4 Lab Results  Component Value Date   TSH 1.470 03/15/2020   ROS: + see HPI Neurological: no tremors/+ numbness/+ tingling/no dizziness  I reviewed pt's medications, allergies, PMH, social hx, family hx, and changes were documented in the history of present illness. Otherwise, unchanged from my initial visit note.  Past Medical History:  Diagnosis Date   DM type 2 (diabetes mellitus, type 2) (HCC)    History of hiatal hernia    Hyperlipidemia    Hypertension    Hypothyroidism    Past  Surgical History:  Procedure Laterality Date   ABDOMINAL HYSTERECTOMY  1995   CARDIAC CATHETERIZATION     CORONARY STENT INTERVENTION N/A 09/07/2017   Procedure: CORONARY STENT INTERVENTION;  Surgeon: Runell Gess, MD;  [facility]: [facility];  Service: Cardiovascular;  Laterality: N/A;   LEFT HEART CATH AND CORONARY ANGIOGRAPHY N/A 09/07/2017   Procedure: LEFT HEART CATH AND CORONARY ANGIOGRAPHY;  Surgeon: Runell Gess, MD;  Location: [facility];  Service: Cardiovascular;  Laterality: N/A;   STAPEDECTOMY Right ~ 2002   Social History   Social History   Marital Status: Married    Spouse Name: N/A   Number of Children: 0   Years of Education: 12   Occupational History   property asst. Production designer, theatre/television/film    Social History Main Topics   Smoking status: Current Every Day Smoker -- 0.50 packs/day for 10 years    Types: Cigarettes   Smokeless tobacco: Not on file     Comment: pt states thinking of quitting   Alcohol Use: No   Drug Use: No   Social History Narrative   Exercise: walking, 4 times/week for 30 minutes.   Current Outpatient Medications on File Prior to Visit  Medication Sig Dispense Refill   aspirin EC 81 MG EC tablet Take 1 tablet (81 mg total) by mouth daily.     atorvastatin (LIPITOR) 80 MG tablet Take 1 tablet (80 mg total) by mouth daily. 90 tablet 3   clopidogrel (PLAVIX) 75 MG tablet Take 1 tablet (75 mg total) by mouth daily. 90 tablet 3   Continuous Blood Gluc Sensor (FREESTYLE LIBRE 3 SENSOR) MISC 1 each by Does not apply route every 14 (fourteen) days. 6 each 3   ergocalciferol (VITAMIN D2) 1.25 MG (50000 UT) capsule      fluticasone (FLONASE) 50 MCG/ACT nasal spray Place 2 sprays into both nostrils daily. 16 g 6   gabapentin (NEURONTIN) 100 MG capsule Take 1 capsule (100 mg total) by mouth at bedtime. 90 capsule 3   insulin aspart (FIASP FLEXTOUCH) 100 UNIT/ML FlexTouch Pen Inject 2-4 Units into the skin 2 (two) times daily before a meal. 15 mL 3    Insulin Pen Needle 32G X 4 MM MISC Use 2x a day 200 each 3   JARDIANCE 25 MG TABS tablet Take 25 mg by mouth daily.     levothyroxine (SYNTHROID) 50 MCG tablet Take 1 tablet (50 mcg total) by mouth daily. 90 tablet 3   metFORMIN (GLUCOPHAGE-XR) 500 MG 24 hr tablet TAKE 1 TABLET BY MOUTH TWICE DAILY WITH MEALS AS DIRECTED 180 tablet 3   nitroGLYCERIN (NITROSTAT) 0.4 MG SL tablet Place 1 tablet (0.4 mg total) under the tongue every 5 (five) minutes x 3 doses as needed for chest pain. 25 tablet 3   No current facility-administered medications  on file prior to visit.   Allergies  Allergen Reactions   Aspirin Other (See Comments)    Burns stomach   Codeine Rash   Family History  Problem Relation Age of Onset   Diabetes Mother    Heart disease Mother    Stroke Father        2011   Hyperlipidemia Father    Diabetes Sister    Heart disease Sister    Heart disease Brother        congenital   PE: There were no vitals taken for this visit.  Wt Readings from Last 3 Encounters:  09/11/21 120 lb 6.4 oz (54.6 kg)  08/08/21 118 lb 6.4 oz (53.7 kg)  06/04/21 123 lb 9.6 oz (56.1 kg)   Constitutional: normal weight, in NAD Eyes: PERRLA, EOMI, no exophthalmos ENT: moist mucous membranes, no thyromegaly, no cervical lymphadenopathy Cardiovascular: RRR, No MRG Respiratory: CTA B Musculoskeletal: no deformities, strength intact in all 4 Skin: moist, warm, no rashes Neurological: no tremor with outstretched hands, DTR normal in all 4  ASSESSMENT: 1. DM2, non-insulin-dependent, uncontrolled, with complications - CAD, s/p NSTEMI 2019, s/p stent - PN   Component     Latest Ref Rng & Units 08/08/2021  Glucose     65 - 99 mg/dL 130142 (H)  C-Peptide     0.80 - 3.85 ng/mL 3.18  Islet Cell Ab     Neg:<1:1 Negative  ZNT8 Antibodies     <15 U/mL <10  Glutamic Acid Decarb Ab     <5 IU/mL <5  Tests are negative for autoimmunity or insulin deficiency.  No family history of medullary thyroid  cancer or personal history of pancreatitis.  2. PN   3. HL  PLAN:  1. Patient with history of longstanding, uncontrolled, type 2 diabetes, with improved control after her NSTEMI + stent placement in 08/2017.  We started Jardiance after this mostly for the cardiovascular benefits.  She is also on metformin.  She is on a lower dose as she could not tolerate higher dose due to diarrhea.  Her HbA1c calculated from fructosamine usually more accurate for her.  At last visit, this was 6.8% for a directly measured HbA1c of 7.8%.  At that time, sugars are excellently controlled during the night in between meals, but they were significantly higher after meals.  At that time, I advised her to add Fiasp before breakfast and dinner.  We checked her for insulin deficiency and pancreatic autoimmunity but the investigation, interestingly, was negative.  I still suspect a degree of insulin deficiency for her which may become manifest in the next few years. CGM interpretation: -At today's visit, we reviewed her CGM downloads: It appears that 86% of values are in target range (goal >70%), while 14% are higher than 180 (goal <25%), and 0% are lower than 70 (goal <4%).  The calculated average blood sugar is 130.  The projected HbA1c for the next 3 months (GMI) is 6.4%. -Reviewing the CGM trends, it appears that her sugars are very well controlled at night in between meals and now, after addition of Fiasp, sugars after dinner are much better controlled.  She only has post dinner high blood sugars after larger meals and I advised her to take more Fiasp whenever she has a larger dinner.  After breakfast, she still has high blood sugars so I advised her to increase Fiasp with this meal.  She is not taking Fiasp before lunch, and postlunch blood sugars are occasionally higher  than target.  I advised her that she may need to take between 3 to 4 units before this meal, also.  She does not require long-acting insulin.  For now we will  continue metformin and Jardiance. - I suggested to:  Patient Instructions  Please continue: - Metformin ER 500 mg 2x a day with meals - Jardiance 25 mg before breakfast  Change: - FiAsp  Breakfast: 5-6 units  Lunch: 3-4 units Dinner: 4-6 units  Please return in 3 months.  - we checked her HbA1c: 6.9%  -lower-no need to check a fructosamine level - advised to check sugars at different times of the day - 4x a day, rotating check times - advised for yearly eye exams >> she is UTD - foot exam performed at last visit - return to clinic in 3 months   2. Diabetic PN -stable -She continues on low-dose Neurontin, 100 mg daily and also B complex  3. HL -Reviewed latest lipid panel from 09/2021: -fractions at goal except LDL is better and now only slightly above the newly recommended target of less than 55 in patients with cardiovascular disease -She continues on Lipitor 80 mg daily and fish oil without side effects  Carlus Pavlov, MD PhD [facility]

## 2021-09-19 NOTE — Patient Instructions (Addendum)
Please continue: ?- Metformin ER 500 mg 2x a day with meals ?- Jardiance 25 mg before breakfast ? ?Change: ?- FiAsp  ?Breakfast: 5-6 units  ?Lunch: 3-4 units ?Dinner: 4-6 units ? ?Please return in 3 months. ?

## 2021-09-25 ENCOUNTER — Other Ambulatory Visit: Payer: Self-pay | Admitting: Internal Medicine

## 2021-09-27 ENCOUNTER — Telehealth: Payer: Self-pay

## 2021-09-27 NOTE — Telephone Encounter (Addendum)
Called patient regarding results. Patient had understanding of results.----- Message from Cannon Kettle, PA-C sent at 09/16/2021 12:41 PM EST ----- ?Cholesterol levels are controlled.  Continue high dose Lipitor.   ?Your triglycerides have lowered and HDL (good cholesterol) is up -- very good. ?

## 2021-09-27 NOTE — Telephone Encounter (Addendum)
Called patient regarding results patient has understanding of results.----- Message from Cannon Kettle, PA-C sent at 09/16/2021 12:40 PM EST ----- ?Good news is that there is not significant blockage in the internal carotid arteries (blockage in the internal carotid arteries increases risk of stroke).   ? 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  The collateral circulation around the shoulder is usually sufficient such that flow around a focal area of stenosis in the subclavian artery is well tolerated.   ? ?You are on appropriate therapy with ASA/Plavix and lipitor.  It is important to have a well controlled diabetes and cholesterol.  No changes in therapy recommended at this time. ? ?

## 2021-10-22 ENCOUNTER — Other Ambulatory Visit: Payer: Self-pay | Admitting: Family Medicine

## 2021-10-22 DIAGNOSIS — Z1231 Encounter for screening mammogram for malignant neoplasm of breast: Secondary | ICD-10-CM

## 2021-10-25 ENCOUNTER — Encounter: Payer: Self-pay | Admitting: Internal Medicine

## 2021-10-25 DIAGNOSIS — E1140 Type 2 diabetes mellitus with diabetic neuropathy, unspecified: Secondary | ICD-10-CM

## 2021-10-25 MED ORDER — FIASP FLEXTOUCH 100 UNIT/ML ~~LOC~~ SOPN: 4.0000 [IU] | PEN_INJECTOR | Freq: Three times a day (TID) | SUBCUTANEOUS | 3 refills | Status: DC

## 2021-11-18 NOTE — Telephone Encounter (Signed)
NA

## 2021-11-25 ENCOUNTER — Ambulatory Visit
Admission: RE | Admit: 2021-11-25 | Discharge: 2021-11-25 | Disposition: A | Discharge status: Home / Self Care | Payer: BC Managed Care – PPO | Source: Ambulatory Visit | Attending: Family Medicine | Admitting: Family Medicine

## 2021-11-25 DIAGNOSIS — Z1231 Encounter for screening mammogram for malignant neoplasm of breast: Secondary | ICD-10-CM

## 2022-01-15 ENCOUNTER — Encounter: Payer: Self-pay | Admitting: Internal Medicine

## 2022-01-15 ENCOUNTER — Ambulatory Visit: Payer: BC Managed Care – PPO | Admitting: Internal Medicine

## 2022-01-15 VITALS — BP 110/78 | HR 67 | Ht 63.0 in | Wt 125.2 lb

## 2022-01-15 DIAGNOSIS — Z794 Long term (current) use of insulin: Secondary | ICD-10-CM | POA: Diagnosis not present

## 2022-01-15 DIAGNOSIS — E785 Hyperlipidemia, unspecified: Secondary | ICD-10-CM

## 2022-01-15 DIAGNOSIS — E1142 Type 2 diabetes mellitus with diabetic polyneuropathy: Secondary | ICD-10-CM

## 2022-01-15 DIAGNOSIS — E1140 Type 2 diabetes mellitus with diabetic neuropathy, unspecified: Secondary | ICD-10-CM | POA: Diagnosis not present

## 2022-01-15 LAB — POCT GLYCOSYLATED HEMOGLOBIN (HGB A1C): Hemoglobin A1C: 7.1 % — AB (ref 4.0–5.6)

## 2022-01-15 NOTE — Progress Notes (Signed)
Patient ID: Carmen Anderson, female   DOB: [birth year], 61 y.o.   MRN: [id number]   HPI: Carmen Anderson is a 61 y.o.-year-old female, initially referred by her PCP, Olean Ree, NP, returning for follow-up for DM2, dx in 2001-2002, insulin-dependent, now more controlled, with complications (CAD - s/p NSTEMI 2019, s/p stent; PN). Last visit 4 months ago.  Interim history: No increased urination, blurry vision, nausea, chest pain.  Reviewed HbA1c levels: Lab Results  Component Value Date   HGBA1C 6.9 (A) 09/19/2021   HGBA1C 7.8 (A) 06/04/2021   HGBA1C 7.3 (A) 01/30/2021   HGBA1C 7.1 (A) 06/04/2020   HGBA1C 7.0 (A) 01/27/2020   HGBA1C 7.4 (A) 09/22/2019   HGBA1C 6.4 (A) 05/18/2019   HGBA1C 7.6 (A) 12/15/2018   HGBA1C 6.7 (A) 08/16/2018   HGBA1C 6.9 (A) 04/13/2018   HGBA1C 6.6 (A) 12/14/2017   HGBA1C 6.9 (H) 09/06/2017   HGBA1C 7 08/14/2017   HGBA1C 6.8 04/13/2017   HGBA1C 6.7 11/25/2016   HGBA1C 7.0 06/03/2016   HGBA1C 7.0 03/04/2016   HGBA1C 7.0 12/03/2015   HGBA1C 7.9 (H) 09/05/2015   HGBA1C 7.7 (H) 05/09/2015  06/04/2021: HbA1c calculated from fructosamine 6.8% 10/01/2020: HbA1c 7.7%  Pt is on a regimen of: - Metformin ER 500 mg once a day >> twice a day 12/2018. Diarrhea with higher doses. - Jardiance 10 mg before breakfast >> 25 mg (increased 10/01/2020) - FiAsp  2-4  >> 3-4 units right before b'fast and dinner - added 07/2021 >> Breakfast: 5-6 units  Lunch: 3-4 units Dinner: 4-6 units We tried Ozempic  0.25  mg weekly - added 05/2021 >> severe nausea >> had to stop. She was previously on Januvia.  Pt checks her sugars >4x a day with her new Freestyle Libre CGM:   Previously:   Previously:   Lowest sugar was 38 ... >> 86 >> 78 >> 58 (night); she has hypoglycemia awareness in the 50s. Highestin: 191 >> 178 >> 300s.  Glucometer: FreeStyle >> [facility]  Pt's meals are: - snack:  1/2 yoghurt, PB + bread - Breakfast: wheat waffle + sugar free syrup or wheat  muffin w/ 1 egg  - Lunch: salads or [place] sandwich on wheat bread >> banana sandwich with light mayo - Dinner: soups, salad, meat 2x a weekly (chicken, salmon) + veggies - Snacks: pineapple, celery She continues to exercise 4-5 times a weeks on the treadmill.  -No CKD, last BUN/creatinine:  Lab Results  Component Value Date   BUN 16 06/04/2021   CREATININE 0.80 06/04/2021  10/01/2020: Glucose 128, BUN/creatinine 13/0.68, GFR 96 On lisinopril 2.5.  -+ HL; last set of lipids: Lab Results  Component Value Date   CHOL 117 09/13/2021   HDL 44 09/13/2021   LDLCALC 60 09/13/2021   TRIG 62 09/13/2021   CHOLHDL 2.7 09/13/2021  10/01/2020: 137/77/53/69 On Lipitor 80, fish oil.  - last eye exam was 05/2021: No DR; [facility].   - + Numbness and tingling and occasional burning in her feet.  On low-dose Neurontin and B complex. These help.  Last foot exam August 08, 2021.  She had a NSTEMI in 08/2017.  At that time, she had a catheterization and had 90% blockage on LAD.  She had a stent placed.  She has HTN and controlled hypothyroidism. She has a history of vitamin D deficiency.  Latest TSH was normal: 10/01/2020: TSH 2.4 Lab Results  Component Value Date   TSH 1.470 03/15/2020   ROS: + see  HPI Neurological: no tremors/+ numbness/+ tingling/no dizziness  I reviewed pt's medications, allergies, PMH, social hx, family hx, and changes were documented in the history of present illness. Otherwise, unchanged from my initial visit note.  Past Medical History:  Diagnosis Date   DM type 2 (diabetes mellitus, type 2) (HCC)    History of hiatal hernia    Hyperlipidemia    Hypertension    Hypothyroidism    Past Surgical History:  Procedure Laterality Date   ABDOMINAL HYSTERECTOMY  1995   CARDIAC CATHETERIZATION     CORONARY STENT INTERVENTION N/A 09/07/2017   Procedure: CORONARY STENT INTERVENTION;  Surgeon: Runell Gess, MD;  Location: [facility];  Service:  Cardiovascular;  Laterality: N/A;   LEFT HEART CATH AND CORONARY ANGIOGRAPHY N/A 09/07/2017   Procedure: LEFT HEART CATH AND CORONARY ANGIOGRAPHY;  Surgeon: Runell Gess, MD;  Location: [facility];  Service: Cardiovascular;  Laterality: N/A;   STAPEDECTOMY Right ~ 2002   Social History   Social History   Marital Status: Married    Spouse Name: N/A   Number of Children: 0   Years of Education: 12   Occupational History   property asst. Production designer, theatre/television/film    Social History Main Topics   Smoking status: Current Every Day Smoker -- 0.50 packs/day for 10 years    Types: Cigarettes   Smokeless tobacco: Not on file     Comment: pt states thinking of quitting   Alcohol Use: No   Drug Use: No   Social History Narrative   Exercise: walking, 4 times/week for 30 minutes.   Current Outpatient Medications on File Prior to Visit  Medication Sig Dispense Refill   aspirin EC 81 MG EC tablet Take 1 tablet (81 mg total) by mouth daily.     atorvastatin (LIPITOR) 80 MG tablet Take 1 tablet (80 mg total) by mouth daily. 90 tablet 3   clopidogrel (PLAVIX) 75 MG tablet Take 1 tablet (75 mg total) by mouth daily. 90 tablet 3   Continuous Blood Gluc Sensor (FREESTYLE LIBRE 3 SENSOR) MISC 1 each by Does not apply route every 14 (fourteen) days. 6 each 3   ergocalciferol (VITAMIN D2) 1.25 MG (50000 UT) capsule      fluticasone (FLONASE) 50 MCG/ACT nasal spray Place 2 sprays into both nostrils daily. 16 g 6   gabapentin (NEURONTIN) 100 MG capsule Take 1 capsule (100 mg total) by mouth at bedtime. 90 capsule 3   insulin aspart (FIASP FLEXTOUCH) 100 UNIT/ML FlexTouch Pen Inject 4-6 Units into the skin 3 (three) times daily before meals. 15 mL 3   Insulin Pen Needle 32G X 4 MM MISC Use 2x a day 200 each 3   JARDIANCE 25 MG TABS tablet Take 1 tablet (25 mg total) by mouth daily. 90 tablet 3   levothyroxine (SYNTHROID) 50 MCG tablet Take 1 tablet (50 mcg total) by mouth daily. 90 tablet 3   metFORMIN  (GLUCOPHAGE-XR) 500 MG 24 hr tablet Take 1 tablet (500 mg total) by mouth 2 (two) times daily with a meal. 180 tablet 3   nitroGLYCERIN (NITROSTAT) 0.4 MG SL tablet Place 1 tablet (0.4 mg total) under the tongue every 5 (five) minutes x 3 doses as needed for chest pain. 25 tablet 3   No current facility-administered medications on file prior to visit.   Allergies  Allergen Reactions   Aspirin Other (See Comments)    Burns stomach   Codeine Rash   Family History  Problem Relation Age of Onset   Diabetes Mother    Heart disease Mother    Stroke Father        2011   Hyperlipidemia Father    Diabetes Sister    Heart disease Sister    Heart disease Brother        congenital   PE: BP 110/78 (BP Location: Left Arm, Patient Position: Sitting, Cuff Size: Normal)   Pulse 67   Ht 5\' 3"  (1.6 m)   Wt 125 lb 3.2 oz (56.8 kg)   SpO2 98%   BMI 22.18 kg/m   Wt Readings from Last 3 Encounters:  01/15/22 125 lb 3.2 oz (56.8 kg)  09/19/21 119 lb 3.2 oz (54.1 kg)  09/11/21 120 lb 6.4 oz (54.6 kg)   Constitutional: normal weight, in NAD Eyes: PERRLA, EOMI, no exophthalmos ENT: moist mucous membranes, no thyromegaly, no cervical lymphadenopathy Cardiovascular: RRR, No MRG Respiratory: CTA B Musculoskeletal: no deformities, strength intact in all 4 Skin: moist, warm, no rashes Neurological: no tremor with outstretched hands, DTR normal in all 4  ASSESSMENT: 1. DM2, insulin-dependent, uncontrolled, with complications - CAD, s/p NSTEMI 2019, s/p stent - PN   Component     Latest Ref Rng & Units 08/08/2021  Glucose     65 - 99 mg/dL 08/10/2021 (H)  C-Peptide     0.80 - 3.85 ng/mL 3.18  Islet Cell Ab     Neg:<1:1 Negative  ZNT8 Antibodies     <15 U/mL <10  Glutamic Acid Decarb Ab     <5 IU/mL <5  Tests are negative for autoimmunity or insulin deficiency.  No family history of medullary thyroid cancer or personal history of pancreatitis.  2. PN   3. HL  PLAN:  1. Patient with  history of longstanding, uncontrolled, type 2 diabetes, with improved control after her NSTEMI + stent placement in 08/2017.  After this, we started Jardiance.  She is also on metformin, low-dose, due to diarrhea with higher doses.  She is on low-dose Fiasp, which we adjusted at last visit.  Sugars after meals improved after adding this and her HbA1c also improved.  We checked her for insulin deficiency and pancreatic autoimmunity but interestingly, the investigation was negative.  I still suspect a degree of insulin deficiency after meals. -At last visit, sugars are very well controlled at night and in between meals and also they were better controlled after dinner, while on Fiasp.  After larger meals, sugars after dinner were higher and we discussed about taking a higher dose for such meals.  After breakfast, she still had some high blood sugars so I advised her to also increase the with this meal.  She was not taking Fiasp before lunch and postlunch blood sugars were occasionally higher than target.  We discussed about taking between 3 to 4 units before this meal, also. -At last visit, the directly measured HbA1c decreased from 7.8 to 6.9%.  We did not check a fructosamine at that time. CGM interpretation: -At today's visit, we reviewed her CGM downloads: It appears that 88% of values are in target range (goal >70%), while 12% are higher than 180 (goal <25%), and 0% are lower than 70 (goal <4%).  The calculated average blood sugar is 133.  The projected HbA1c for the next 3 months (GMI) is 6.5%. -Reviewing the CGM trends, it appears that her sugars after lunch have improved, while the sugars after breakfast are still quite high.  Post dinner blood sugars are  approximately the same as before, only with an occasional hyperglycemic spike.  Upon questioning, she is eating Cheerios with almond milk in the morning.  We discussed about replacing Cheerios with oatmeal or grits and add berries to it.  If she does  this, I do not expect her to need an increase in her breakfast yes.  We can continue the rest of the regimen.  The sugars overnight are excellent.  She does not need long-acting insulin yet. - I suggested to:  Patient Instructions  Please continue: - Metformin ER 500 mg 2x a day with meals - Jardiance 25 mg before breakfast - FiAsp  Breakfast: 5-6 units  Lunch: 3-4 units Dinner: 4-6 units  Try to stop morning cereals.  Please return in 3-4 months.  - we checked her HbA1c: 7.1% (higher) - advised to check sugars at different times of the day - 4x a day, rotating check times - advised for yearly eye exams >> she is UTD - return to clinic in 3-4 months   2. Diabetic PN -stable -She continues on low-dose Neurontin, 100 mg daily, and also B complex  3. HL -Reviewed latest lipid panel from 09/2021: Fractions at goal except LDL was better and now only slightly above the  recommended target of <55 in patients with cardiovascular disease -She continues on Lipitor 80 mg daily and fish oil without side effects  Carlus Pavlov, MD PhD [facility] Endocrinology

## 2022-01-15 NOTE — Patient Instructions (Addendum)
Please continue: - Metformin ER 500 mg 2x a day with meals - Jardiance 25 mg before breakfast - FiAsp  Breakfast: 5-6 units  Lunch: 3-4 units Dinner: 4-6 units  Try to stop morning cereals.  Please return in 3-4 months.

## 2022-01-20 ENCOUNTER — Ambulatory Visit
Admission: RE | Admit: 2022-01-20 | Discharge: 2022-01-20 | Disposition: A | Discharge status: Home / Self Care | Payer: BC Managed Care – PPO | Source: Ambulatory Visit | Attending: Internal Medicine | Admitting: Internal Medicine

## 2022-01-20 VITALS — BP 128/65 | HR 65 | Temp 99.0°F | Resp 18 | Ht 63.0 in | Wt 125.0 lb

## 2022-01-20 DIAGNOSIS — J069 Acute upper respiratory infection, unspecified: Secondary | ICD-10-CM | POA: Diagnosis not present

## 2022-01-20 MED ORDER — BENZONATATE 100 MG PO CAPS: 100.0000 mg | ORAL_CAPSULE | Freq: Three times a day (TID) | ORAL | 0 refills | Status: DC | PRN

## 2022-01-20 NOTE — ED Triage Notes (Signed)
Patient c/o non-productive cough x 2 days ago, dry cough, worse at night.  Patient is having some chest congestion, nasal drainage.  Patient has taken throat lozenges and Tylenol for headache.

## 2022-01-20 NOTE — Discharge Instructions (Signed)
You have a viral upper respiratory infection that should run its course and self resolve with symptomatic treatment.  You have been sent a cough medication to take as needed.  Please follow-up if symptoms persist or worsen.  COVID test is pending.  We will call if it is positive.

## 2022-01-20 NOTE — ED Provider Notes (Signed)
[facility]    CSN: [id number] Arrival date & time: 01/20/22  1600      History   Chief Complaint Chief Complaint  Patient presents with   Cough    I have a bad cough, Headache and sometimes a sore throat. This started late on Friday 01/18/22. - Entered by patient   Appointment    HPI Carmen Anderson is a 61 y.o. female.   Patient presents with nasal drainage and dry cough that has been present for approximately 2 days.  Denies any known fevers.  She states that her husband has had similar symptoms.  Patient has used throat lozenges and taking Tylenol with minimal improvement.  Patient also reports headache.  Denies chest pain, shortness of breath, sore throat, ear pain, nausea, vomiting, diarrhea, abdominal pain.  Denies history of asthma or COPD and patient is not a smoker.   Cough   Past Medical History:  Diagnosis Date   DM type 2 (diabetes mellitus, type 2) (HCC)    History of hiatal hernia    Hyperlipidemia    Hypertension    Hypothyroidism     Patient Active Problem List   Diagnosis Date Noted   Vitamin D deficiency 03/15/2020   Age-related osteoporosis without current pathological fracture 03/15/2020   Iatrogenic hypotension 06/19/2018   Former smoker 06/19/2018   Diabetes mellitus with cardiac complication (HCC) 06/19/2018   Non-ST elevation (NSTEMI) myocardial infarction (HCC) 09/08/2017   Elevated troponin    Abnormal cardiovascular stress test    Essential hypertension    Chest pain 09/04/2017   Diabetic peripheral neuropathy associated with type 2 diabetes mellitus (HCC) 04/13/2017   Type 2 diabetes mellitus with diabetic neuropathy, without long-term current use of insulin (HCC) 09/25/2015   Hyperlipidemia    Allergic rhinitis    Hypothyroid     Past Surgical History:  Procedure Laterality Date   ABDOMINAL HYSTERECTOMY  1995   CARDIAC CATHETERIZATION     CORONARY STENT INTERVENTION N/A 09/07/2017   Procedure: CORONARY STENT  INTERVENTION;  Surgeon: Runell Gess, MD;  Location: [facility];  Service: Cardiovascular;  Laterality: N/A;   LEFT HEART CATH AND CORONARY ANGIOGRAPHY N/A 09/07/2017   Procedure: LEFT HEART CATH AND CORONARY ANGIOGRAPHY;  Surgeon: Runell Gess, MD;  Location: [facility];  Service: Cardiovascular;  Laterality: N/A;   STAPEDECTOMY Right ~ 2002    OB History   No obstetric history on file.      Home Medications    Prior to Admission medications   Medication Sig Start Date End Date Taking? Authorizing Provider  aspirin EC 81 MG EC tablet Take 1 tablet (81 mg total) by mouth daily. 09/09/17  Yes Azalee Course, PA  atorvastatin (LIPITOR) 80 MG tablet Take 1 tablet (80 mg total) by mouth daily. 09/11/21  Yes Juanda Crumble K, PA-C  benzonatate (TESSALON) 100 MG capsule Take 1 capsule (100 mg total) by mouth every 8 (eight) hours as needed for cough. 01/20/22  Yes Abayomi Pattison, Rolly Salter E, FNP  clopidogrel (PLAVIX) 75 MG tablet Take 1 tablet (75 mg total) by mouth daily. 09/11/21  Yes Juanda Crumble K, PA-C  Continuous Blood Gluc Sensor (FREESTYLE LIBRE 3 SENSOR) MISC 1 each by Does not apply route every 14 (fourteen) days. 06/04/21  Yes Carlus Pavlov, MD  ergocalciferol (VITAMIN D2) 1.25 MG (50000 UT) capsule  10/01/20  Yes [provider]  fluticasone (FLONASE) 50 MCG/ACT nasal spray Place 2 sprays into both nostrils daily. 03/15/20  Yes Lezlie Lye, Meda Coffee, MD  gabapentin (NEURONTIN) 100 MG capsule Take 1 capsule (100 mg total) by mouth at bedtime. 10/19/19  Yes Stallings, Zoe A, MD  insulin aspart (FIASP FLEXTOUCH) 100 UNIT/ML FlexTouch Pen Inject 4-6 Units into the skin 3 (three) times daily before meals. 10/25/21  Yes Carlus Pavlov, MD  Insulin Pen Needle 32G X 4 MM MISC Use 2x a day 08/08/21  Yes Carlus Pavlov, MD  JARDIANCE 25 MG TABS tablet Take 1 tablet (25 mg total) by mouth daily. 09/19/21  Yes Carlus Pavlov, MD  levothyroxine (SYNTHROID) 50 MCG tablet  Take 1 tablet (50 mcg total) by mouth daily. 10/19/19  Yes Doristine Bosworth, MD  metFORMIN (GLUCOPHAGE-XR) 500 MG 24 hr tablet Take 1 tablet (500 mg total) by mouth 2 (two) times daily with a meal. 09/19/21  Yes Carlus Pavlov, MD  nitroGLYCERIN (NITROSTAT) 0.4 MG SL tablet Place 1 tablet (0.4 mg total) under the tongue every 5 (five) minutes x 3 doses as needed for chest pain. 08/08/19  Yes Lennette Bihari, MD    Family History Family History  Problem Relation Age of Onset   Diabetes Mother    Heart disease Mother    Stroke Father        2011   Hyperlipidemia Father    Diabetes Sister    Heart disease Sister    Heart disease Brother        congenital    Social History Social History   Tobacco Use   Smoking status: Former    Packs/day: 0.50    Years: 27.00    Total pack years: 13.50    Types: Cigarettes    Quit date: 09/05/2017    Years since quitting: 4.3   Smokeless tobacco: Never  Vaping Use   Vaping Use: Never used  Substance Use Topics   Alcohol use: No   Drug use: No     Allergies   Aspirin and Codeine   Review of Systems Review of Systems Per HPI  Physical Exam Triage Vital Signs ED Triage Vitals  Enc Vitals Group     BP 01/20/22 1614 128/65     Pulse Rate 01/20/22 1614 65     Resp 01/20/22 1614 18     Temp 01/20/22 1614 99 F (37.2 C)     Temp Source 01/20/22 1614 Oral     SpO2 01/20/22 1614 95 %     Weight 01/20/22 1615 125 lb (56.7 kg)     Height 01/20/22 1615 5\' 3"  (1.6 m)     Head Circumference --      Peak Flow --      Pain Score 01/20/22 1615 0     Pain Loc --      Pain Edu? --      Excl. in GC? --    No data found.  Updated Vital Signs BP 128/65 (BP Location: Left Arm)   Pulse 65   Temp 99 F (37.2 C) (Oral)   Resp 18   Ht 5\' 3"  (1.6 m)   Wt 125 lb (56.7 kg)   SpO2 95%   BMI 22.14 kg/m   Visual Acuity Right Eye Distance:   Left Eye Distance:   Bilateral Distance:    Right Eye Near:   Left Eye Near:    Bilateral  Near:     Physical Exam Constitutional:      General: She is not in acute distress.    Appearance: Normal appearance. She is  not toxic-appearing or diaphoretic.  HENT:     Head: Normocephalic and atraumatic.     Right Ear: Tympanic membrane and ear canal normal.     Left Ear: Tympanic membrane and ear canal normal.     Nose: Congestion present.     Mouth/Throat:     Mouth: Mucous membranes are moist.     Pharynx: No posterior oropharyngeal erythema.  Eyes:     Extraocular Movements: Extraocular movements intact.     Conjunctiva/sclera: Conjunctivae normal.     Pupils: Pupils are equal, round, and reactive to light.  Cardiovascular:     Rate and Rhythm: Normal rate and regular rhythm.     Pulses: Normal pulses.     Heart sounds: Normal heart sounds.  Pulmonary:     Effort: Pulmonary effort is normal. No respiratory distress.     Breath sounds: Normal breath sounds. No stridor. No wheezing, rhonchi or rales.  Abdominal:     General: Abdomen is flat. Bowel sounds are normal.     Palpations: Abdomen is soft.  Musculoskeletal:        General: Normal range of motion.     Cervical back: Normal range of motion.  Skin:    General: Skin is warm and dry.  Neurological:     General: No focal deficit present.     Mental Status: She is alert and oriented to person, place, and time. Mental status is at baseline.  Psychiatric:        Mood and Affect: Mood normal.        Behavior: Behavior normal.      UC Treatments / Results  Labs (all labs ordered are listed, but only abnormal results are displayed) Labs Reviewed  NOVEL CORONAVIRUS, NAA    EKG   Radiology No results found.  Procedures Procedures (including critical care time)  Medications Ordered in UC Medications - No data to display  Initial Impression / Assessment and Plan / UC Course  I have reviewed the triage vital signs and the nursing notes.  Pertinent labs & imaging results that were available during my care  of the patient were reviewed by me and considered in my medical decision making (see chart for details).     Patient presents with symptoms likely from a viral upper respiratory infection. Differential includes bacterial pneumonia, sinusitis, allergic rhinitis, COVID-19, flu. Do not suspect underlying cardiopulmonary process. Symptoms seem unlikely related to ACS, CHF or COPD exacerbations, pneumonia, pneumothorax. Patient is nontoxic appearing and not in need of emergent medical intervention.  COVID test pending.  Recommended symptom control with symptom controlling medications.  Benzonatate prescribed for patient.  Return if symptoms fail to improve in 1-2 weeks or you develop shortness of breath, chest pain, severe headache. Patient states understanding and is agreeable.  Discharged with PCP followup.  Final Clinical Impressions(s) / UC Diagnoses   Final diagnoses:  Viral upper respiratory tract infection with cough     Discharge Instructions      You have a viral upper respiratory infection that should run its course and self resolve with symptomatic treatment.  You have been sent a cough medication to take as needed.  Please follow-up if symptoms persist or worsen.  COVID test is pending.  We will call if it is positive.    ED Prescriptions     Medication Sig Dispense Auth. Provider   benzonatate (TESSALON) 100 MG capsule Take 1 capsule (100 mg total) by mouth every 8 (eight) hours as needed for  cough. 21 capsule Gustavus Bryant, [place]      PDMP not reviewed this encounter.   Gustavus Bryant, [place] 01/20/22 1626

## 2022-01-21 LAB — NOVEL CORONAVIRUS, NAA: SARS-CoV-2, NAA: NOT DETECTED

## 2022-05-09 ENCOUNTER — Other Ambulatory Visit: Payer: Self-pay | Admitting: Internal Medicine

## 2022-05-20 ENCOUNTER — Ambulatory Visit: Payer: BC Managed Care – PPO | Admitting: Internal Medicine

## 2022-05-20 ENCOUNTER — Encounter: Payer: Self-pay | Admitting: Internal Medicine

## 2022-05-20 VITALS — BP 120/80 | HR 58 | Ht 63.0 in | Wt 129.4 lb

## 2022-05-20 DIAGNOSIS — Z794 Long term (current) use of insulin: Secondary | ICD-10-CM

## 2022-05-20 DIAGNOSIS — E1140 Type 2 diabetes mellitus with diabetic neuropathy, unspecified: Secondary | ICD-10-CM

## 2022-05-20 DIAGNOSIS — E1142 Type 2 diabetes mellitus with diabetic polyneuropathy: Secondary | ICD-10-CM

## 2022-05-20 DIAGNOSIS — E785 Hyperlipidemia, unspecified: Secondary | ICD-10-CM

## 2022-05-20 LAB — POCT GLYCOSYLATED HEMOGLOBIN (HGB A1C): Hemoglobin A1C: 7 % — AB (ref 4.0–5.6)

## 2022-05-20 MED ORDER — JARDIANCE 25 MG PO TABS: 25.0000 mg | ORAL_TABLET | Freq: Every day | ORAL | 3 refills | Status: DC

## 2022-05-20 MED ORDER — FIASP FLEXTOUCH 100 UNIT/ML ~~LOC~~ SOPN: 6.0000 [IU] | PEN_INJECTOR | Freq: Three times a day (TID) | SUBCUTANEOUS | 3 refills | Status: DC

## 2022-05-20 MED ORDER — METFORMIN HCL ER 500 MG PO TB24: 500.0000 mg | ORAL_TABLET | Freq: Two times a day (BID) | ORAL | 3 refills | Status: DC

## 2022-05-20 NOTE — Patient Instructions (Addendum)
Please continue: - Metformin ER 500 mg 2x a day with meals - Jardiance 25 mg before breakfast  Change: - FiAsp  Breakfast: 6-10 units  Lunch: 4 units Dinner: 6-10 units  Try to stop morning cereals. Change to whole grain bread. Stop pineapple as snack.  Please return in 3-4 months.

## 2022-05-20 NOTE — Progress Notes (Signed)
Patient ID: Carmen Anderson, female   DOB: 10/23/1960, 61 y.o.   MRN: [id number]   HPI: Carmen Anderson is a 61 y.o.-year-old female, initially referred by her PCP, Olean Ree, NP, returning for follow-up for DM2, dx in 2001-2002, insulin-dependent, now more controlled, with complications (CAD - s/p NSTEMI 2019, s/p stent; PN). Last visit 4 months ago.  Interim history: No increased urination, blurry vision, nausea, chest pain.  Reviewed HbA1c levels: Lab Results  Component Value Date   HGBA1C 7.1 (A) 01/15/2022   HGBA1C 6.9 (A) 09/19/2021   HGBA1C 7.8 (A) 06/04/2021   HGBA1C 7.3 (A) 01/30/2021   HGBA1C 7.1 (A) 06/04/2020   HGBA1C 7.0 (A) 01/27/2020   HGBA1C 7.4 (A) 09/22/2019   HGBA1C 6.4 (A) 05/18/2019   HGBA1C 7.6 (A) 12/15/2018   HGBA1C 6.7 (A) 08/16/2018   HGBA1C 6.9 (A) 04/13/2018   HGBA1C 6.6 (A) 12/14/2017   HGBA1C 6.9 (H) 09/06/2017   HGBA1C 7 08/14/2017   HGBA1C 6.8 04/13/2017   HGBA1C 6.7 11/25/2016   HGBA1C 7.0 06/03/2016   HGBA1C 7.0 03/04/2016   HGBA1C 7.0 12/03/2015   HGBA1C 7.9 (H) 09/05/2015  06/04/2021: HbA1c calculated from fructosamine 6.8% 10/01/2020: HbA1c 7.7%  Pt is on a regimen of: - Metformin ER 500 mg once a day >> twice a day 12/2018. Diarrhea with higher doses. - Jardiance 10 mg before breakfast >> 25 mg (increased 10/01/2020) - FiAsp  2-4  >> 3-4 units right before b'fast and dinner - added 07/2021 >> Breakfast: 5-6 units  Lunch: 3-4 units Dinner: 4-6 units We tried Ozempic  0.25  mg weekly - added 05/2021 >> severe nausea >> had to stop. She was previously on Januvia.  Pt checks her sugars >4x a day with her new Freestyle Libre CGM:  Previously:   Previously:   Lowest sugar was 38 ... >> 86 >> 78 >> 58 (night) >> 60; she has hypoglycemia awareness in the 50s. Highestin: 191 >> 178 >> 300s >> 300s.  Glucometer: FreeStyle >> [facility]  Pt's meals are: - snack:  1/2 yoghurt, PB + bread  - Breakfast: wheat waffle + sugar free syrup  or wheat muffin w/ 1 egg >> cereals or oatmeal; multi-grain bread - Lunch: salads or [place] sandwich on wheat bread >> banana sandwich with light mayo >> soup or white meat sandwich with multigrain bread - Dinner: soups, salad, meat 2x a weekly (chicken, salmon) + veggies >> veggies or meat + veggies and fried brown rice - Snacks: pineapple, celery She continues to exercise 4-5 times a weeks on the treadmill.  -No CKD, last BUN/creatinine:  Lab Results  Component Value Date   BUN 16 06/04/2021   CREATININE 0.80 06/04/2021  10/01/2020: Glucose 128, BUN/creatinine 13/0.68, GFR 96 On lisinopril 2.5.  -+ HL; last set of lipids: Lab Results  Component Value Date   CHOL 117 09/13/2021   HDL 44 09/13/2021   LDLCALC 60 09/13/2021   TRIG 62 09/13/2021   CHOLHDL 2.7 09/13/2021  10/01/2020: 137/77/53/69 On Lipitor 80, fish oil.  - last eye exam was 05/2021: No DR; [facility].   - + Numbness and tingling and occasional burning in her feet.  On low-dose Neurontin and B complex. These help.  Last foot exam August 08, 2021.  She had a NSTEMI in 08/2017.  At that time, she had a catheterization and had 90% blockage on LAD.  She had a stent placed.  She has HTN, vitamin D deficiency, controlled hypothyroidism. Latest TSH  levels were normal: 10/01/2020: TSH 2.4 Lab Results  Component Value Date   TSH 1.470 03/15/2020   ROS: + see HPI  I reviewed pt's medications, allergies, PMH, social hx, family hx, and changes were documented in the history of present illness. Otherwise, unchanged from my initial visit note.  Past Medical History:  Diagnosis Date   DM type 2 (diabetes mellitus, type 2) (HCC)    History of hiatal hernia    Hyperlipidemia    Hypertension    Hypothyroidism    Past Surgical History:  Procedure Laterality Date   ABDOMINAL HYSTERECTOMY  1995   CARDIAC CATHETERIZATION     CORONARY STENT INTERVENTION N/A 09/07/2017   Procedure: CORONARY STENT INTERVENTION;  Surgeon: Runell Gess, MD;  Location: [facility];  Service: Cardiovascular;  Laterality: N/A;   LEFT HEART CATH AND CORONARY ANGIOGRAPHY N/A 09/07/2017   Procedure: LEFT HEART CATH AND CORONARY ANGIOGRAPHY;  Surgeon: Runell Gess, MD;  Location: [facility];  Service: Cardiovascular;  Laterality: N/A;   STAPEDECTOMY Right ~ 2002   Social History   Social History   Marital Status: Married    Spouse Name: N/A   Number of Children: 0   Years of Education: 12   Occupational History   property asst. Production designer, theatre/television/film    Social History Main Topics   Smoking status: Current Every Day Smoker -- 0.50 packs/day for 10 years    Types: Cigarettes   Smokeless tobacco: Not on file     Comment: pt states thinking of quitting   Alcohol Use: No   Drug Use: No   Social History Narrative   Exercise: walking, 4 times/week for 30 minutes.   Current Outpatient Medications on File Prior to Visit  Medication Sig Dispense Refill   aspirin EC 81 MG EC tablet Take 1 tablet (81 mg total) by mouth daily.     atorvastatin (LIPITOR) 80 MG tablet Take 1 tablet (80 mg total) by mouth daily. 90 tablet 3   benzonatate (TESSALON) 100 MG capsule Take 1 capsule (100 mg total) by mouth every 8 (eight) hours as needed for cough. 21 capsule 0   clopidogrel (PLAVIX) 75 MG tablet Take 1 tablet (75 mg total) by mouth daily. 90 tablet 3   Continuous Blood Gluc Sensor (FREESTYLE LIBRE 3 SENSOR) MISC 1 each by Does not apply route every 14 (fourteen) days. 6 each 3   ergocalciferol (VITAMIN D2) 1.25 MG (50000 UT) capsule      fluticasone (FLONASE) 50 MCG/ACT nasal spray Place 2 sprays into both nostrils daily. 16 g 6   gabapentin (NEURONTIN) 100 MG capsule Take 1 capsule (100 mg total) by mouth at bedtime. 90 capsule 3   insulin aspart (FIASP FLEXTOUCH) 100 UNIT/ML FlexTouch Pen Inject 4-6 Units into the skin 3 (three) times daily before meals. 15 mL 3   Insulin Pen Needle 32G X 4 MM MISC Use 2x a day 200 each 3   JARDIANCE  25 MG TABS tablet Take 1 tablet (25 mg total) by mouth daily. 90 tablet 3   levothyroxine (SYNTHROID) 50 MCG tablet Take 1 tablet (50 mcg total) by mouth daily. 90 tablet 3   metFORMIN (GLUCOPHAGE-XR) 500 MG 24 hr tablet Take 1 tablet (500 mg total) by mouth 2 (two) times daily with a meal. 180 tablet 3   nitroGLYCERIN (NITROSTAT) 0.4 MG SL tablet Place 1 tablet (0.4 mg total) under the tongue every 5 (five) minutes x 3 doses as needed for  chest pain. 25 tablet 3   No current facility-administered medications on file prior to visit.   Allergies  Allergen Reactions   Aspirin Other (See Comments)    Burns stomach   Codeine Rash   Family History  Problem Relation Age of Onset   Diabetes Mother    Heart disease Mother    Stroke Father        2011   Hyperlipidemia Father    Diabetes Sister    Heart disease Sister    Heart disease Brother        congenital   PE: BP 120/80 (BP Location: Right Arm, Patient Position: Sitting, Cuff Size: Normal)   Pulse (!) 58   Ht 5\' 3"  (1.6 m)   Wt 129 lb 6.4 oz (58.7 kg)   SpO2 99%   BMI 22.92 kg/m   Wt Readings from Last 3 Encounters:  05/20/22 129 lb 6.4 oz (58.7 kg)  01/20/22 125 lb (56.7 kg)  01/15/22 125 lb 3.2 oz (56.8 kg)   Constitutional: normal weight, in NAD Eyes:  EOMI, no exophthalmos ENT: no neck masses, no cervical lymphadenopathy Cardiovascular: RRR, No MRG Respiratory: CTA B Musculoskeletal: no deformities Skin:no rashes Neurological: no tremor with outstretched hands  ASSESSMENT: 1. DM2, insulin-dependent, uncontrolled, with complications - CAD, s/p NSTEMI 2019, s/p stent - PN   Component     Latest Ref Rng & Units 08/08/2021  Glucose     65 - 99 mg/dL 08/10/2021 (H)  C-Peptide     0.80 - 3.85 ng/mL 3.18  Islet Cell Ab     Neg:<1:1 Negative  ZNT8 Antibodies     <15 U/mL <10  Glutamic Acid Decarb Ab     <5 IU/mL <5  Tests are negative for autoimmunity or insulin deficiency.  No family history of medullary thyroid  cancer or personal history of pancreatitis.  2. PN   3. HL  PLAN:  1. Patient with history of longstanding, uncontrolled, type 2 diabetes, with improved control after NSTEMI and stent placement in 08/2017.  After this, we started Jardiance.  She is also on metformin ER, low-dose due to diarrhea with higher doses.  She is on Fiasp low doses.  She did not appear to need long-acting insulin.  Sugars improved after adding Fiasp.  At last visit, sugars after breakfast were high so we discussed about stopping morning cereals.  She was having Cheerios with almond milk and discussed about replacing Cheerios with oatmeal or grits and add berries to it.  We did not change the regimen at that time.  HbA1c was 7.1%, higher. CGM interpretation: -At today's visit, we reviewed her CGM downloads: It appears that 81% of values are in target range (goal >70%), while 19% are higher than 180 (goal <25%), and 0% are lower than 70 (goal <4%).  The calculated average blood sugar is 145.  The projected HbA1c for the next 3 months (GMI) is 6.8%. -Reviewing the CGM trends, sugars appear to increase sometimes quite significantly after breakfast and then again after dinner.  Upon questioning, even though she does notice that some of her breakfast and dinner to increase the blood sugars after the meals, she is not going above 6 units of Fiasp before these meals.  We discussed at this visit that if she notices that a meal is causing significant hypoglycemia, there is no point in continuing with the same dose of Fiasp, but she needs to increase the dose.  I sent a prescription for Fiasp to reflect  the new doses, to her pharmacy.  For now, she is doing a good job taking 4 units before lunch, which is a small meal for her.  We do not need to change this.  Also, we can continue with metformin and Jardiance.  I refilled these for her. -We also reviewed her diet and I made specific suggestions (See patient's instructions below) - I  suggested to:  Patient Instructions  Please continue: - Metformin ER 500 mg 2x a day with meals - Jardiance 25 mg before breakfast  Change: - FiAsp  Breakfast: 6-10 units  Lunch: 4 units Dinner: 6-10 units  Try to stop morning cereals. Change to whole grain bread. Stop pineapple as snack.  Please return in 3-4 months.  - we checked her HbA1c: 7.0% - slightly lower - advised to check sugars at different times of the day - 4x a day, rotating check times - advised for yearly eye exams >> she is UTD - return to clinic in 3-4 months   2. Diabetic PN -stable -She continues on low-dose Neurontin, 100 mg daily, and also B complex  3. HL - Reviewed latest lipid panel from 09/2021: Fractions at goal except LDL only slightly above the recommended target of less than 55 in patients with cardiovascular disease: Lab Results  Component Value Date   CHOL 117 09/13/2021   HDL 44 09/13/2021   LDLCALC 60 09/13/2021   TRIG 62 09/13/2021   CHOLHDL 2.7 09/13/2021  - Continues Lipitor 80 mg daily and fish oil without side effects.  Philemon Kingdom, MD PhD [facility] Endocrinology

## 2022-06-01 ENCOUNTER — Other Ambulatory Visit: Payer: Self-pay | Admitting: Internal Medicine

## 2022-06-03 ENCOUNTER — Other Ambulatory Visit: Payer: Self-pay | Admitting: Family Medicine

## 2022-06-03 DIAGNOSIS — E2839 Other primary ovarian failure: Secondary | ICD-10-CM

## 2022-07-11 ENCOUNTER — Other Ambulatory Visit: Payer: Self-pay | Admitting: Internal Medicine

## 2022-07-23 ENCOUNTER — Other Ambulatory Visit: Payer: Self-pay | Admitting: Physician Assistant

## 2022-07-23 DIAGNOSIS — I214 Non-ST elevation (NSTEMI) myocardial infarction: Secondary | ICD-10-CM

## 2022-08-07 ENCOUNTER — Ambulatory Visit
Admission: RE | Admit: 2022-08-07 | Discharge: 2022-08-07 | Disposition: A | Discharge status: Home / Self Care | Payer: BC Managed Care – PPO | Source: Ambulatory Visit | Attending: Family Medicine | Admitting: Family Medicine

## 2022-08-07 DIAGNOSIS — E2839 Other primary ovarian failure: Secondary | ICD-10-CM

## 2022-08-14 NOTE — Progress Notes (Signed)
This encounter was created in error - please disregard.

## 2022-09-15 ENCOUNTER — Encounter: Payer: Self-pay | Admitting: Nurse Practitioner

## 2022-09-15 ENCOUNTER — Ambulatory Visit: Payer: BC Managed Care – PPO | Attending: Nurse Practitioner | Admitting: Nurse Practitioner

## 2022-09-15 VITALS — BP 138/56 | HR 58 | Ht 63.0 in | Wt 129.2 lb

## 2022-09-15 DIAGNOSIS — I6529 Occlusion and stenosis of unspecified carotid artery: Secondary | ICD-10-CM

## 2022-09-15 DIAGNOSIS — I771 Stricture of artery: Secondary | ICD-10-CM | POA: Diagnosis not present

## 2022-09-15 DIAGNOSIS — E1159 Type 2 diabetes mellitus with other circulatory complications: Secondary | ICD-10-CM

## 2022-09-15 DIAGNOSIS — I2510 Atherosclerotic heart disease of native coronary artery without angina pectoris: Secondary | ICD-10-CM | POA: Diagnosis not present

## 2022-09-15 DIAGNOSIS — E785 Hyperlipidemia, unspecified: Secondary | ICD-10-CM

## 2022-09-15 DIAGNOSIS — I10 Essential (primary) hypertension: Secondary | ICD-10-CM | POA: Diagnosis not present

## 2022-09-15 NOTE — Patient Instructions (Signed)
Medication Instructions:  Your physician recommends that you continue on your current medications as directed. Please refer to the Current Medication list given to you today.   *If you need a refill on your cardiac medications before your next appointment, please call your pharmacy*   Lab Work: NONE ordered at this time of appointment   If you have labs (blood work) drawn today and your tests are completely normal, you will receive your results only by: [place] (if you have MyChart) OR A paper copy in the mail If you have any lab test that is abnormal or we need to change your treatment, we will call you to review the results.   Testing/Procedures: NONE ordered at this time of appointment     Follow-Up: At [facility], you and your health needs are our priority.  As part of our continuing mission to provide you with exceptional heart care, we have created designated Provider Care Teams.  These Care Teams include your primary Cardiologist (physician) and Advanced Practice Providers (APPs -  Physician Assistants and Nurse Practitioners) who all work together to provide you with the care you need, when you need it.  We recommend signing up for the patient portal called "MyChart".  Sign up information is provided on this After Visit Summary.  MyChart is used to connect with patients for Virtual Visits (Telemedicine).  Patients are able to view lab/test results, encounter notes, upcoming appointments, etc.  Non-urgent messages can be sent to your provider as well.   To learn more about what you can do with MyChart, go to [email or web address].    Your next appointment:   1 year(s)  Provider:   Shelva Majestic, MD     Other Instructions

## 2022-09-15 NOTE — Progress Notes (Signed)
Office Visit    Patient Name: Carmen Anderson Date of Encounter: 09/15/2022  Primary Care Provider:  Hayden Rasmussen, MD Primary Cardiologist:  Shelva Majestic, MD  Chief Complaint    62 year old female with a history of CAD s/p NSTEMI, DES-LAD in 2019, hypertension, hyperlipidemia, carotid artery stenosis, subclavian artery stenosis, type 2 diabetes, hypothyroidism, and former tobacco use who presents for follow-up related to CAD.  Past Medical History    Past Medical History:  Diagnosis Date   DM type 2 (diabetes mellitus, type 2) ([place])    History of hiatal hernia    Hyperlipidemia    Hypertension    Hypothyroidism    Past Surgical History:  Procedure Laterality Date   ABDOMINAL HYSTERECTOMY  1995   CARDIAC CATHETERIZATION     CORONARY STENT INTERVENTION N/A 09/07/2017   Procedure: CORONARY STENT INTERVENTION;  Surgeon: Lorretta Harp, MD;  Location: [place] [facility];  Service: Cardiovascular;  Laterality: N/A;   LEFT HEART CATH AND CORONARY ANGIOGRAPHY N/A 09/07/2017   Procedure: LEFT HEART CATH AND CORONARY ANGIOGRAPHY;  Surgeon: Lorretta Harp, MD;  Location: [place] [facility];  Service: Cardiovascular;  Laterality: N/A;   STAPEDECTOMY Right ~ 2002    Allergies  Allergies  Allergen Reactions   Aspirin Other (See Comments)    Burns stomach   Codeine Rash     Labs/Other Studies Reviewed    The following studies were reviewed today: LHC 09-02-2017: Diagnostic Dominance: Left Left Anterior Descending  Prox LAD to Mid LAD lesion is 90% stenosed.    Intervention   Prox LAD to Mid LAD lesion  Stent  A stent was successfully placed.  Post-Intervention Lesion Assessment  There is a 0% residual stenosis post intervention.     Echo 02-Sep-2017: Study Conclusions   - Left ventricle: The cavity size was normal. Wall thickness was    normal. Systolic function was normal. The estimated ejection    fraction was in the range of 60% to 65%. Wall motion was normal;     there were no regional wall motion abnormalities. Left    ventricular diastolic function parameters were normal.  - Mitral valve: There was mild regurgitation.  - Tricuspid valve: There was trivial regurgitation.   Carotid ultrasound 09/2021:  Summary:  Right Carotid: Velocities in the right ICA are consistent with a 1-39%  stenosis.   Left Carotid: There is no evidence of stenosis in the left ICA. The ECA  appears >50% stenosed. The extracranial vessels were near-normal  with only minimal wall thickening or plaque.   Vertebrals:  Bilateral vertebral arteries demonstrate antegrade flow.  Subclavians: Bilateral subclavian arteries were stenotic. Bilateral  subclavian artery flow was disturbed.  Recent Labs: No results found for requested labs within last 365 days.  Recent Lipid Panel    Component Value Date/Time   CHOL 117 09/13/2021 0810   TRIG 62 09/13/2021 0810   HDL 44 09/13/2021 0810   CHOLHDL 2.7 09/13/2021 0810   CHOLHDL 5.6 09/05/2017 0259   VLDL 22 09/05/2017 0259   LDLCALC 60 09/13/2021 0810    History of Present Illness    62 year old female with the above past medical history including CAD s/p NSTEMI, DES-LAD in 2019, carotid artery stenosis, subclavian artery stenosis, hypertension, hyperlipidemia, type 2 diabetes, hypothyroidism, and former tobacco use.  She has not tolerated beta-blocker therapy in the setting of resting bradycardia.  She did not tolerate lisinopril due to hypotension.  She was last seen in the office  on 09/11/2021 and was doing well from a cardiac standpoint.  She denied symptoms concerning for angina.  Carotid dopplers in 09/2021 in the setting of bilateral carotid bruits revealed 1 to 39% R ICA stenosis, bilateral subclavian artery stenosis.   She presents today for follow-up.  Since her last visit she has done well from a cardiac standpoint. She denies symptoms concerning for angina, denies palpitations, dizziness, presyncope, syncope.  Overall,  she reports feeling well.  Home Medications    Current Outpatient Medications  Medication Sig Dispense Refill   aspirin EC 81 MG EC tablet Take 1 tablet (81 mg total) by mouth daily.     atorvastatin (LIPITOR) 80 MG tablet Take 1 tablet (80 mg total) by mouth daily. 90 tablet 3   BD PEN NEEDLE NANO 2ND GEN 32G X 4 MM MISC USE TO TEST 2 TIMES A DAY 200 each 3   clopidogrel (PLAVIX) 75 MG tablet TAKE 1 TABLET BY MOUTH EVERY DAY 90 tablet 3   Continuous Blood Gluc Sensor (FREESTYLE LIBRE 3 SENSOR) MISC USE EVERY 14 DAYS AS DIRECTED 2 each 11   gabapentin (NEURONTIN) 100 MG capsule Take 1 capsule (100 mg total) by mouth at bedtime. 90 capsule 3   insulin aspart (FIASP FLEXTOUCH) 100 UNIT/ML FlexTouch Pen Inject 6-10 Units into the skin 3 (three) times daily before meals. 30 mL 3   JARDIANCE 25 MG TABS tablet Take 1 tablet (25 mg total) by mouth daily. 90 tablet 3   levothyroxine (SYNTHROID) 50 MCG tablet Take 1 tablet (50 mcg total) by mouth daily. 90 tablet 3   metFORMIN (GLUCOPHAGE-XR) 500 MG 24 hr tablet Take 1 tablet (500 mg total) by mouth 2 (two) times daily with a meal. 180 tablet 3   nitroGLYCERIN (NITROSTAT) 0.4 MG SL tablet Place 1 tablet (0.4 mg total) under the tongue every 5 (five) minutes x 3 doses as needed for chest pain. 25 tablet 3   No current facility-administered medications for this visit.     Review of Systems    She denies chest pain, palpitations, dyspnea, pnd, orthopnea, n, v, dizziness, syncope, edema, weight gain, or early satiety. All other systems reviewed and are otherwise negative except as noted above.   Physical Exam    VS:  BP (!) 138/56 (BP Location: Right Arm, Patient Position: Sitting, Cuff Size: Normal)   Pulse (!) 58   Ht '5\' 3"'$  (1.6 m)   Wt 129 lb 3.2 oz (58.6 kg)   SpO2 96%   BMI 22.89 kg/m   GEN: Well nourished, well developed, in no acute distress. HEENT: normal. Neck: Supple, no JVD, bilateral carotid bruits, no masses. Cardiac: RRR, no  murmurs, rubs, or gallops. No clubbing, cyanosis, edema.  Radials/DP/PT 2+ and equal bilaterally.  Respiratory:  Respirations regular and unlabored, clear to auscultation bilaterally. GI: Soft, nontender, nondistended, BS + x 4. MS: no deformity or atrophy. Skin: warm and dry, no rash. Neuro:  Strength and sensation are intact. Psych: Normal affect.  Accessory Clinical Findings    ECG personally reviewed by me today -sinus bradycardia, 58 bpm- no acute changes.   Lab Results  Component Value Date   WBC 9.7 10/17/2019   HGB 15.1 10/17/2019   HCT 43.9 10/17/2019   MCV 91 10/17/2019   PLT 330 10/17/2019   Lab Results  Component Value Date   CREATININE 0.80 06/04/2021   BUN 16 06/04/2021   NA 141 06/04/2021   K 4.6 06/04/2021   CL 105 06/04/2021  CO2 27 06/04/2021   Lab Results  Component Value Date   ALT 26 03/15/2020   AST 20 03/15/2020   ALKPHOS 65 03/15/2020   BILITOT 0.2 03/15/2020   Lab Results  Component Value Date   CHOL 117 09/13/2021   HDL 44 09/13/2021   LDLCALC 60 09/13/2021   TRIG 62 09/13/2021   CHOLHDL 2.7 09/13/2021    Lab Results  Component Value Date   HGBA1C 7.0 (A) 05/20/2022    Assessment & Plan    1. CAD:  S/p NSTEMI, DES-LAD in 2019.  Stable with no anginal symptoms.  No indication for ischemic evaluation.  Continue aspirin, Plavix, Lipitor.  She did not tolerate lisinopril due to hypotension.  She is asking if she needs to remain on both Plavix and Aspirin.  I will reach out to Dr. Claiborne Billings to see if he wishes to continue DAPT indefinitely.  2. Carotid artery/subclavian artery stenosis: Carotid dopplers in 09/2021 in the setting of bilateral carotid bruits revealed 1 to 39% R ICA stenosis, bilateral subclavian artery stenosis.  She is asymptomatic.  Pt declines repeat carotid ultrasound at this time.  Would recommend repeat carotid Dopplers in 1 year for routine monitoring.  Continue aspirin, Lipitor.  3. Hypertension: BP well controlled.  Continue current antihypertensive regimen.   4. Hyperlipidemia: LDL was 70 in 05/2022. Continue Lipitor.  5. Type 2 diabetes: A1c was 7.0 in 05/2022. Follows with endocrinology.    6. Disposition: Follow-up in 1 year.     Lenna Sciara, NP 09/15/2022, 2:34 PM

## 2022-09-18 ENCOUNTER — Encounter: Payer: Self-pay | Admitting: Internal Medicine

## 2022-09-18 ENCOUNTER — Ambulatory Visit: Payer: BC Managed Care – PPO | Admitting: Internal Medicine

## 2022-09-18 VITALS — BP 140/86 | HR 60 | Wt 131.8 lb

## 2022-09-18 DIAGNOSIS — E1140 Type 2 diabetes mellitus with diabetic neuropathy, unspecified: Secondary | ICD-10-CM | POA: Diagnosis not present

## 2022-09-18 DIAGNOSIS — Z794 Long term (current) use of insulin: Secondary | ICD-10-CM

## 2022-09-18 DIAGNOSIS — E1142 Type 2 diabetes mellitus with diabetic polyneuropathy: Secondary | ICD-10-CM

## 2022-09-18 DIAGNOSIS — E785 Hyperlipidemia, unspecified: Secondary | ICD-10-CM

## 2022-09-18 LAB — POCT GLYCOSYLATED HEMOGLOBIN (HGB A1C): Hemoglobin A1C: 6.9 % — AB (ref 4.0–5.6)

## 2022-09-18 NOTE — Patient Instructions (Addendum)
Please continue: - Metformin ER 500 mg 2x a day with meals - Jardiance 25 mg before breakfast - FiAsp  Breakfast: 6-10 units  Lunch: 3-4 units Dinner: 6-10 units  Try to change b'fast as discussed.   Please return in 4 months.[name]

## 2022-09-18 NOTE — Progress Notes (Signed)
Patient ID: [name], female   DOB: 01-02-1961, 62 y.o.   MRN: [id number]   HPI: Carmen Anderson is a 62 y.o.-year-old female, initially referred by her PCP, Carmen Reamer, NP, returning for follow-up for DM2, dx in 2001-2002, insulin-dependent, now more controlled, with complications (CAD - s/p NSTEMI 2019, s/p stent; PN). Last visit 4 months ago.  Interim history: No increased urination, blurry vision, nausea, chest pain.  Reviewed HbA1c levels: Lab Results  Component Value Date   HGBA1C 7.0 (A) 05/20/2022   HGBA1C 7.1 (A) 01/15/2022   HGBA1C 6.9 (A) 09/19/2021   HGBA1C 7.8 (A) 06/04/2021   HGBA1C 7.3 (A) 01/30/2021   HGBA1C 7.1 (A) 06/04/2020   HGBA1C 7.0 (A) 01/27/2020   HGBA1C 7.4 (A) 09/22/2019   HGBA1C 6.4 (A) 05/18/2019   HGBA1C 7.6 (A) 12/15/2018   HGBA1C 6.7 (A) 08/16/2018   HGBA1C 6.9 (A) 04/13/2018   HGBA1C 6.6 (A) 12/14/2017   HGBA1C 6.9 (H) 09/06/2017   HGBA1C 7 08/14/2017   HGBA1C 6.8 04/13/2017   HGBA1C 6.7 11/25/2016   HGBA1C 7.0 06/03/2016   HGBA1C 7.0 03/04/2016   HGBA1C 7.0 12/03/2015  06/04/2021: HbA1c calculated from fructosamine 6.8% 10/01/2020: HbA1c 7.7%  Pt is on a regimen of: - Metformin ER 500 mg once a day >> twice a day 12/2018. Diarrhea with higher doses. - Jardiance 10 mg before breakfast >> 25 mg (increased 10/01/2020) - FiAsp - added 07/2021 >> Breakfast: 6-10 units  Lunch: 4 units Dinner: 6-10 units We tried Ozempic  0.25  mg weekly - added 05/2021 >> severe nausea >> had to stop. She was previously on Januvia.  Pt checks her sugars >4x a day with her new Freestyle Libre 3 CGM:   Previously:  Previously:   Lowest sugar was 38 ... >> 58 (night) >> 60 >> 60; she has hypoglycemia awareness in the 50s. Highestin: 300s >> 300s >> 200s.  Glucometer: FreeStyle >> [facility]  Pt's meals are: - snack:  1/2 yoghurt, PB + bread  - Breakfast: wheat waffle + sugar free syrup or wheat muffin w/ 1 egg >> cereals or oatmeal; multi-grain  bread - Lunch: salads or [place] sandwich on wheat bread >> banana sandwich with light mayo >> soup or white meat sandwich with multigrain bread - Dinner: soups, salad, meat 2x a weekly (chicken, salmon) + veggies >> veggies or meat + veggies and fried brown rice - Snacks:  celery She continues to exercise 4-5 times a weeks on the treadmill.  -No CKD, last BUN/creatinine:  05/20/2022: 14/0.6 Lab Results  Component Value Date   BUN 16 06/04/2021   CREATININE 0.80 06/04/2021  10/01/2020: Glucose 128, BUN/creatinine 13/0.68, GFR 96 On lisinopril 2.5.  -+ HL; last set of lipids: 05/20/2022: 138/116/45/70 Lab Results  Component Value Date   CHOL 117 09/13/2021   HDL 44 09/13/2021   LDLCALC 60 09/13/2021   TRIG 62 09/13/2021   CHOLHDL 2.7 09/13/2021  10/01/2020: 137/77/53/69 On Lipitor 80, fish oil.  - last eye exam was 05/2021: No DR; [facility].   - + Numbness and tingling and occasional burning in her feet.  On low-dose Neurontin and B complex. These help.  Last foot exam 08/08/2021.  She had a NSTEMI in 08/2017.  At that time, she had a catheterization and had 90% blockage on LAD.  She had a stent placed.  She has HTN, vitamin D deficiency, controlled hypothyroidism. Latest TSH levels were normal: 10/01/2020: TSH 2.4 Lab Results  Component Value Date  TSH 1.470 03/15/2020   ROS: + see HPI  I reviewed pt's medications, allergies, PMH, social hx, family hx, and changes were documented in the history of present illness. Otherwise, unchanged from my initial visit note.  Past Medical History:  Diagnosis Date   DM type 2 (diabetes mellitus, type 2) ([place])    History of hiatal hernia    Hyperlipidemia    Hypertension    Hypothyroidism    Past Surgical History:  Procedure Laterality Date   ABDOMINAL HYSTERECTOMY  1995   CARDIAC CATHETERIZATION     CORONARY STENT INTERVENTION N/A 09/07/2017   Procedure: CORONARY STENT INTERVENTION;  Surgeon: Lorretta Harp, MD;  Location: [facility];  Service: Cardiovascular;  Laterality: N/A;   LEFT HEART CATH AND CORONARY ANGIOGRAPHY N/A 09/07/2017   Procedure: LEFT HEART CATH AND CORONARY ANGIOGRAPHY;  Surgeon: Lorretta Harp, MD;  Location: [facility];  Service: Cardiovascular;  Laterality: N/A;   STAPEDECTOMY Right ~ 2002   Social History   Social History   Marital Status: Married    Spouse Name: N/A   Number of Children: 0   Years of Education: 12   Occupational History   property asst. Freight forwarder    Social History Main Topics   Smoking status: Current Every Day Smoker -- 0.50 packs/day for 10 years    Types: Cigarettes   Smokeless tobacco: Not on file     Comment: pt states thinking of quitting   Alcohol Use: No   Drug Use: No   Social History Narrative   Exercise: walking, 4 times/week for 30 minutes.   Current Outpatient Medications on File Prior to Visit  Medication Sig Dispense Refill   aspirin EC 81 MG EC tablet Take 1 tablet (81 mg total) by mouth daily.     atorvastatin (LIPITOR) 80 MG tablet Take 1 tablet (80 mg total) by mouth daily. 90 tablet 3   BD PEN NEEDLE NANO 2ND GEN 32G X 4 MM MISC USE TO TEST 2 TIMES A DAY 200 each 3   clopidogrel (PLAVIX) 75 MG tablet TAKE 1 TABLET BY MOUTH EVERY DAY 90 tablet 3   Continuous Blood Gluc Sensor (FREESTYLE LIBRE 3 SENSOR) MISC USE EVERY 14 DAYS AS DIRECTED 2 each 11   gabapentin (NEURONTIN) 100 MG capsule Take 1 capsule (100 mg total) by mouth at bedtime. 90 capsule 3   insulin aspart (FIASP FLEXTOUCH) 100 UNIT/ML FlexTouch Pen Inject 6-10 Units into the skin 3 (three) times daily before meals. 30 mL 3   JARDIANCE 25 MG TABS tablet Take 1 tablet (25 mg total) by mouth daily. 90 tablet 3   levothyroxine (SYNTHROID) 50 MCG tablet Take 1 tablet (50 mcg total) by mouth daily. 90 tablet 3   metFORMIN (GLUCOPHAGE-XR) 500 MG 24 hr tablet Take 1 tablet (500 mg total) by mouth 2 (two) times daily with a meal. 180 tablet 3   nitroGLYCERIN (NITROSTAT) 0.4  MG SL tablet Place 1 tablet (0.4 mg total) under the tongue every 5 (five) minutes x 3 doses as needed for chest pain. 25 tablet 3   No current facility-administered medications on file prior to visit.   Allergies  Allergen Reactions   Aspirin Other (See Comments)    Burns stomach   Codeine Rash   Family History  Problem Relation Age of Onset   Diabetes Mother    Heart disease Mother    Stroke Father        2011  Hyperlipidemia Father    Diabetes Sister    Heart disease Sister    Heart disease Brother        congenital   PE: BP (!) 140/86 (BP Location: Left Arm, Patient Position: Sitting, Cuff Size: Normal)   Pulse 60   Wt 131 lb 12.8 oz (59.8 kg)   SpO2 100%   BMI 23.35 kg/m   Wt Readings from Last 3 Encounters:  09/18/22 131 lb 12.8 oz (59.8 kg)  09/15/22 129 lb 3.2 oz (58.6 kg)  05/20/22 129 lb 6.4 oz (58.7 kg)   Constitutional: normal weight, in NAD Eyes:  EOMI, no exophthalmos ENT: no neck masses, no cervical lymphadenopathy Cardiovascular: RRR, No MRG Respiratory: CTA B Musculoskeletal: no deformities Skin:no rashes Neurological: no tremor with outstretched hands Diabetic Foot Exam - Simple   Simple Foot Form Diabetic Foot exam was performed with the following findings: Yes 09/18/2022  8:18 AM  Visual Inspection No deformities, no ulcerations, no other skin breakdown bilaterally: Yes Sensation Testing Intact to touch and monofilament testing bilaterally: Yes Pulse Check Posterior Tibialis and Dorsalis pulse intact bilaterally: Yes Comments Thick nails    ASSESSMENT: 1. DM2, insulin-dependent, uncontrolled, with complications - CAD, s/p NSTEMI 2019, s/p stent - PN   Component     Latest Ref Rng & Units 08/08/2021  Glucose     65 - 99 mg/dL 142 (H)  C-Peptide     0.80 - 3.85 ng/mL 3.18  Islet Cell Ab     Neg:<1:1 Negative  ZNT8 Antibodies     <15 U/mL <10  Glutamic Acid Decarb Ab     <5 IU/mL <5  Tests are negative for autoimmunity or insulin  deficiency.  No family history of medullary thyroid cancer or personal history of pancreatitis.  2. PN   3. HL  PLAN:  1. Patient with history of longstanding, uncontrolled, type 2 diabetes, with improved control after NSTEMI and stent placement in 2019.  After this, we started Jardiance.  She is also on metformin ER, but low dose due to diarrhea with higher doses.  At last visit, we adjusted her Fiasp doses and I made specific suggestions about improving diet including stopping morning cereals, changing to whole-grain bread and stopping high glycemic index fruit as snacks.  HbA1c at that time was 7.0%, slightly lower. CGM interpretation: -At today's visit, we reviewed her CGM downloads: It appears that 91% of values are in target range (goal >70%), while 8% are higher than 180 (goal <25%), and 1% are lower than 70 (goal <4%).  The calculated average blood sugar is 122.  The projected HbA1c for the next 3 months ([facility]) is 6.2%. -Reviewing the CGM trends, sugars appear to be excellent overnight, and then increasing significantly and abruptly after breakfast, dropping slightly after lunch and increasing Let's significantly after dinner.  At today's visit, we continued metformin and Jardiance, however, I advised her to try to take a lower dose of insulin before lunch to avoid blood sugar drops in the afternoon (which she corrects with peanut butter crackers), but we discussed at length about healthier breakfast choices and how to include more healthy fat with this meal.  I advised her to try to stop creamer in her coffee. - I suggested to:  Patient Instructions  Please continue: - Metformin ER 500 mg 2x a day with meals - Jardiance 25 mg before breakfast - FiAsp  Breakfast: 6-10 units  Lunch: 3-4 units Dinner: 6-10 units  Try to change b'fast as discussed.  Please return in 4 months..   - we checked her HbA1c: 6.9% (improved) - advised to check sugars at different times of the day - 4x a  day, rotating check times - advised for yearly eye exams >> she is due - return to clinic in 4 months   2. Diabetic PN - stable - On low-dose Neurontin, 100 mg daily and also B complex  3. HL -Reviewed latest lipid panel from 05/2022: Fractions at goal except LDL only slightly above the recommended target of less than 55 due to history of cardiovascular disease: -She continues on Lipitor 80 mg daily and fish oil, without side effects  Philemon Kingdom, MD PhD [facility] End[facility]

## 2022-10-22 ENCOUNTER — Other Ambulatory Visit: Payer: Self-pay | Admitting: Physician Assistant

## 2022-11-03 ENCOUNTER — Other Ambulatory Visit: Payer: Self-pay | Admitting: Family Medicine

## 2022-11-03 DIAGNOSIS — Z1231 Encounter for screening mammogram for malignant neoplasm of breast: Secondary | ICD-10-CM

## 2022-12-10 ENCOUNTER — Ambulatory Visit
Admission: RE | Admit: 2022-12-10 | Discharge: 2022-12-10 | Disposition: A | Discharge status: Home / Self Care | Payer: BC Managed Care – PPO | Source: Ambulatory Visit | Attending: Family Medicine | Admitting: Family Medicine

## 2022-12-10 DIAGNOSIS — Z1231 Encounter for screening mammogram for malignant neoplasm of breast: Secondary | ICD-10-CM

## 2022-12-12 ENCOUNTER — Other Ambulatory Visit: Payer: Self-pay | Admitting: Family Medicine

## 2022-12-12 DIAGNOSIS — R928 Other abnormal and inconclusive findings on diagnostic imaging of breast: Secondary | ICD-10-CM

## 2022-12-25 ENCOUNTER — Ambulatory Visit
Admission: RE | Admit: 2022-12-25 | Discharge: 2022-12-25 | Disposition: A | Discharge status: Home / Self Care | Payer: BC Managed Care – PPO | Source: Ambulatory Visit | Attending: Family Medicine | Admitting: Family Medicine

## 2022-12-25 ENCOUNTER — Other Ambulatory Visit: Payer: Self-pay | Admitting: Family Medicine

## 2022-12-25 DIAGNOSIS — R928 Other abnormal and inconclusive findings on diagnostic imaging of breast: Secondary | ICD-10-CM

## 2022-12-25 DIAGNOSIS — N6320 Unspecified lump in the left breast, unspecified quadrant: Secondary | ICD-10-CM

## 2023-01-20 ENCOUNTER — Encounter: Payer: Self-pay | Admitting: Internal Medicine

## 2023-01-20 ENCOUNTER — Telehealth: Payer: Self-pay

## 2023-01-20 ENCOUNTER — Ambulatory Visit: Payer: BC Managed Care – PPO | Admitting: Internal Medicine

## 2023-01-20 VITALS — BP 136/80 | HR 62 | Ht 63.0 in | Wt 125.2 lb

## 2023-01-20 DIAGNOSIS — E785 Hyperlipidemia, unspecified: Secondary | ICD-10-CM | POA: Diagnosis not present

## 2023-01-20 DIAGNOSIS — E1142 Type 2 diabetes mellitus with diabetic polyneuropathy: Secondary | ICD-10-CM | POA: Diagnosis not present

## 2023-01-20 DIAGNOSIS — Z794 Long term (current) use of insulin: Secondary | ICD-10-CM

## 2023-01-20 DIAGNOSIS — Z7984 Long term (current) use of oral hypoglycemic drugs: Secondary | ICD-10-CM

## 2023-01-20 DIAGNOSIS — E1140 Type 2 diabetes mellitus with diabetic neuropathy, unspecified: Secondary | ICD-10-CM | POA: Diagnosis not present

## 2023-01-20 DIAGNOSIS — E119 Type 2 diabetes mellitus without complications: Secondary | ICD-10-CM

## 2023-01-20 LAB — HEMOGLOBIN A1C: Hemoglobin A1C: 6.5

## 2023-01-20 MED ORDER — BD PEN NEEDLE NANO 2ND GEN 32G X 4 MM MISC: 3 refills | Status: DC

## 2023-01-20 NOTE — Patient Instructions (Addendum)
Please continue: - Metformin ER 500 mg 2x a day with meals - Jardiance 25 mg before breakfast - FiAsp  Breakfast: 6-8 units  Lunch: 4-6 >> 3-4 units Dinner: 6-8 units   Start: - Tradjenta 5 mg before b'fast Let me know if sugars improve, to call in Januvia.    Please return in 4 months.

## 2023-01-20 NOTE — Telephone Encounter (Signed)
Medication: Tradjenta  Dosage: 5mg   Lot #: [id number] Exp: 07/2023 Quantity: 2

## 2023-01-20 NOTE — Progress Notes (Signed)
Patient ID: Carmen Anderson, female   DOB: 1961/07/08, 62 y.o.   MRN: [id number]   HPI: Carmen Anderson is a 62 y.o.-year-old female, initially referred by her PCP, Carmen Ree, NP, returning for follow-up for DM2, dx in 2001-2002, insulin-dependent, now more controlled, with complications (CAD - s/p NSTEMI 2019, s/p stent; PN). Last visit 4 months ago.  Interim history: No increased urination, blurry vision, nausea, chest pain. Since last visit, she had an abnormal mammogram.  She will be followed in 2 months and may need biopsy at that time.  Reviewed HbA1c levels: Lab Results  Component Value Date   HGBA1C 6.9 (A) 09/18/2022   HGBA1C 7.0 (A) 05/20/2022   HGBA1C 7.1 (A) 01/15/2022   HGBA1C 6.9 (A) 09/19/2021   HGBA1C 7.8 (A) 06/04/2021   HGBA1C 7.3 (A) 01/30/2021   HGBA1C 7.1 (A) 06/04/2020   HGBA1C 7.0 (A) 01/27/2020   HGBA1C 7.4 (A) 09/22/2019   HGBA1C 6.4 (A) 05/18/2019   HGBA1C 7.6 (A) 12/15/2018   HGBA1C 6.7 (A) 08/16/2018   HGBA1C 6.9 (A) 04/13/2018   HGBA1C 6.6 (A) 12/14/2017   HGBA1C 6.9 (H) 09/06/2017   HGBA1C 7 08/14/2017   HGBA1C 6.8 04/13/2017   HGBA1C 6.7 11/25/2016   HGBA1C 7.0 06/03/2016   HGBA1C 7.0 03/04/2016  06/04/2021: HbA1c calculated from fructosamine 6.8% 10/01/2020: HbA1c 7.7%  Pt is on a regimen of: - Metformin ER 500 mg once a day >> twice a day  - Jardiance 10 mg before breakfast >> 25 mg (increased 10/01/2020) - FiAsp - added 07/2021 >> Breakfast: 6-10 units  Lunch: 3-4 units Dinner: 6-9 units We tried Ozempic  0.25  mg weekly - added 05/2021 >> severe nausea >> had to stop. She was previously on Januvia. She had diarrhea with higher doses of metformin ER.  Pt checks her sugars >4x a day with her Freestyle Libre 3 CGM:  Previously:   Lowest sugar was 38 ... >> 60 >> 50s (nighttime); she has hypoglycemia awareness in the 50s. Highestin: 300s >> 200s>> 200s.  Glucometer: FreeStyle >> [facility]  Pt's meals are: - snack:  1/2 yoghurt,  PB + bread  - Breakfast: wheat waffle + sugar free syrup or wheat muffin w/ 1 egg >> cereals or oatmeal; multi-grain bread - Lunch: salads or [place] sandwich on wheat bread >> banana sandwich with light mayo >> soup or white meat sandwich with multigrain bread - Dinner: soups, salad, meat 2x a weekly (chicken, salmon) + veggies >> veggies or meat + veggies and fried brown rice - Snacks:  celery She continues to exercise 4-5 times a weeks on the treadmill.  -No CKD, last BUN/creatinine:  05/20/2022: 14/0.6 Lab Results  Component Value Date   BUN 16 06/04/2021   CREATININE 0.80 06/04/2021  10/01/2020: Glucose 128, BUN/creatinine 13/0.68, GFR 96 On lisinopril 2.5.  -+ HL; last set of lipids: 05/20/2022: 138/116/45/70 Lab Results  Component Value Date   CHOL 117 09/13/2021   HDL 44 09/13/2021   LDLCALC 60 09/13/2021   TRIG 62 09/13/2021   CHOLHDL 2.7 09/13/2021  10/01/2020: 137/77/53/69 On Lipitor 80, fish oil.  - last eye exam was 05/2021: No DR; [facility].   - + Numbness and tingling and occasional burning in her feet.  On low-dose Neurontin and B complex. These help.  Last foot exam 09/18/2022.  She had a NSTEMI in 08/2017.  At that time, she had a catheterization and had 90% blockage on LAD.  She had a stent placed.  She has HTN, vitamin D deficiency, controlled hypothyroidism. Latest TSH levels were normal: 10/01/2020: TSH 2.4 Lab Results  Component Value Date   TSH 1.470 03/15/2020   ROS: + see HPI  I reviewed pt's medications, allergies, PMH, social hx, family hx, and changes were documented in the history of present illness. Otherwise, unchanged from my initial visit note.  Past Medical History:  Diagnosis Date   DM type 2 (diabetes mellitus, type 2) ([facility])    History of hiatal hernia    Hyperlipidemia    Hypertension    Hypothyroidism    Past Surgical History:  Procedure Laterality Date   ABDOMINAL HYSTERECTOMY  1995   CARDIAC CATHETERIZATION     CORONARY STENT  INTERVENTION N/A 09/07/2017   Procedure: CORONARY STENT INTERVENTION;  Surgeon: Runell Gess, MD;  Location: [facility];  Service: Cardiovascular;  Laterality: N/A;   LEFT HEART CATH AND CORONARY ANGIOGRAPHY N/A 09/07/2017   Procedure: LEFT HEART CATH AND CORONARY ANGIOGRAPHY;  Surgeon: Runell Gess, MD;  Location: [facility];  Service: Cardiovascular;  Laterality: N/A;   STAPEDECTOMY Right ~ 2002   Social History   Social History   Marital Status: Married    Spouse Name: N/A   Number of Children: 0   Years of Education: 12   Occupational History   property asst. Production designer, theatre/television/film    Social History Main Topics   Smoking status: Current Every Day Smoker -- 0.50 packs/day for 10 years    Types: Cigarettes   Smokeless tobacco: Not on file     Comment: pt states thinking of quitting   Alcohol Use: No   Drug Use: No   Social History Narrative   Exercise: walking, 4 times/week for 30 minutes.   Current Outpatient Medications on File Prior to Visit  Medication Sig Dispense Refill   aspirin EC 81 MG EC tablet Take 1 tablet (81 mg total) by mouth daily.     atorvastatin (LIPITOR) 80 MG tablet TAKE 1 TABLET BY MOUTH EVERY DAY 90 tablet 3   BD PEN NEEDLE NANO 2ND GEN 32G X 4 MM MISC USE TO TEST 2 TIMES A DAY 200 each 3   clopidogrel (PLAVIX) 75 MG tablet TAKE 1 TABLET BY MOUTH EVERY DAY 90 tablet 3   Continuous Blood Gluc Sensor (FREESTYLE LIBRE 3 SENSOR) MISC USE EVERY 14 DAYS AS DIRECTED 2 each 11   gabapentin (NEURONTIN) 100 MG capsule Take 1 capsule (100 mg total) by mouth at bedtime. 90 capsule 3   insulin aspart (FIASP FLEXTOUCH) 100 UNIT/ML FlexTouch Pen Inject 6-10 Units into the skin 3 (three) times daily before meals. 30 mL 3   JARDIANCE 25 MG TABS tablet Take 1 tablet (25 mg total) by mouth daily. 90 tablet 3   levothyroxine (SYNTHROID) 50 MCG tablet Take 1 tablet (50 mcg total) by mouth daily. 90 tablet 3   metFORMIN (GLUCOPHAGE-XR) 500 MG 24 hr tablet Take 1  tablet (500 mg total) by mouth 2 (two) times daily with a meal. 180 tablet 3   nitroGLYCERIN (NITROSTAT) 0.4 MG SL tablet Place 1 tablet (0.4 mg total) under the tongue every 5 (five) minutes x 3 doses as needed for chest pain. 25 tablet 3   No current facility-administered medications on file prior to visit.   Allergies  Allergen Reactions   Aspirin Other (See Comments)    Burns stomach   Codeine Rash   Family History  Problem Relation Age of Onset   Diabetes  Mother    Heart disease Mother    Stroke Father        2011   Hyperlipidemia Father    Diabetes Sister    Heart disease Sister    Heart disease Brother        congenital   PE: There were no vitals taken for this visit.  Wt Readings from Last 3 Encounters:  09/18/22 131 lb 12.8 oz (59.8 kg)  09/15/22 129 lb 3.2 oz (58.6 kg)  05/20/22 129 lb 6.4 oz (58.7 kg)   Constitutional: normal weight, in NAD Eyes:  EOMI, no exophthalmos ENT: no neck masses, no cervical lymphadenopathy Cardiovascular: RRR, No MRG Respiratory: CTA B Musculoskeletal: no deformities Skin:no rashes Neurological: no tremor with outstretched hands  ASSESSMENT: 1. DM2, insulin-dependent, uncontrolled, with complications - CAD, s/p NSTEMI 2019, s/p stent - PN   Component     Latest Ref Rng & Units 08/08/2021  Glucose     65 - 99 mg/dL 725 (H)  C-Peptide     0.80 - 3.85 ng/mL 3.18  Islet Cell Ab     Neg:<1:1 Negative  ZNT8 Antibodies     <15 U/mL <10  Glutamic Acid Decarb Ab     <5 IU/mL <5  Tests are negative for autoimmunity or insulin deficiency.  No family history of medullary thyroid cancer or personal history of pancreatitis.  2. PN   3. HL  PLAN:  1. Patient with history of longstanding, uncontrolled, type 2 diabetes, with improved control after her NSTEMI and stent placement in 2019.  We started SGLT2 inhibitor (Jardiance) afterwards.  She also continues on metformin ER low-dose due to diarrhea with higher doses.  We also have  her on ultra rapid acting insulin before meals.  At last visit, sugars appears to be excellent overnight but then increasing significantly and abruptly after breakfast, dropping slightly after lunch and increasing after dinner.  We discussed about taking a lower dose of insulin before lunch to avoid blood sugar drops in the afternoon.  We also discussed about healthier breakfast choices and how to include more healthy fats with this meal.  I also advised her to try to stop creamer in her coffee.  HbA1c was 6.9%, lower. CGM interpretation: -At today's visit, we reviewed her CGM downloads: It appears that 87% of values are in target range (goal >70%), while 11% are higher than 180 (goal <25%), and 2% are lower than 70 (goal <4%).  The calculated average blood sugar is 121.  The projected HbA1c for the next 3 months ([facility]MI) is 6.2%. -Reviewing the CGM trends, sugars appear to be fluctuating mainly within the target range, but with higher spikes after dinner occasionally.  She also has blood sugar drops in the afternoon and after dinner, sometimes to the 50s especially overnight.  Upon questioning, she did not decrease the dose of Fiasp before lunch and I advised her to try to do so.  Also, we discussed about trying to take Fiasp 10 to 15 minutes before dinner, if possible to see if that would improve her hyperglycemic peak after this meal and prevent a subsequent drop in blood sugars.  We also discussed about the possibility of using a DPP 4 inhibitor to hopefully reduce (or even eliminate) her Fiasp requirements.  I gave her samples of Tradjenta and I advised her to let me know how she is doing with this.  If this helps, I can send a prescription for Januvia 100 mg to her pharmacy, as I  see that this is covered by her insurance. - I suggested to:  Patient Instructions  Please continue: - Metformin ER 500 mg 2x a day with meals - Jardiance 25 mg before breakfast - FiAsp  Breakfast: 6-8 units  Lunch: 4-6 >> 3-4  units Dinner: 6-8 units   Start: - Tradjenta 5 mg before b'fast Let me know if sugars improve, to call in Januvia.    Please return in 4 months.  - we checked her HbA1c: 6.5% (lower)  - advised to check sugars at different times of the day - 4x a day, rotating check times - advised for yearly eye exams >> she is not UTD - return to clinic in 4 months   2. Diabetic PN - stable -She continues on low-dose Neurontin, 100 mg daily and also B complex  3. HL - Reviewed latest lipid panel from 05/2022: Fractions at goal except for an LDL slightly above the recommended target of less than 55 due to history of cardiovascular disease - Continues Lipitor 80 mg daily and fish oil without side effects.  Carlus Pavlov, MD PhD [facility] Endocrinology

## 2023-01-28 ENCOUNTER — Other Ambulatory Visit: Payer: Self-pay | Admitting: Internal Medicine

## 2023-01-28 DIAGNOSIS — E1140 Type 2 diabetes mellitus with diabetic neuropathy, unspecified: Secondary | ICD-10-CM

## 2023-01-28 NOTE — Telephone Encounter (Signed)
Left a detailed message,   J.Miller,RMA  

## 2023-02-05 ENCOUNTER — Encounter: Payer: Self-pay | Admitting: Internal Medicine

## 2023-02-11 NOTE — Telephone Encounter (Signed)
Please advise in Dr. Elvera Lennox absence. She has provided [place] with her readings. Please send message back to the clinical pool.

## 2023-02-19 ENCOUNTER — Ambulatory Visit
Admission: RE | Admit: 2023-02-19 | Discharge: 2023-02-19 | Disposition: A | Discharge status: Home / Self Care | Payer: BC Managed Care – PPO | Source: Ambulatory Visit | Attending: Family Medicine | Admitting: Family Medicine

## 2023-02-19 DIAGNOSIS — N6320 Unspecified lump in the left breast, unspecified quadrant: Secondary | ICD-10-CM

## 2023-02-19 DIAGNOSIS — R928 Other abnormal and inconclusive findings on diagnostic imaging of breast: Secondary | ICD-10-CM

## 2023-02-24 ENCOUNTER — Other Ambulatory Visit: Payer: Self-pay | Admitting: Internal Medicine

## 2023-02-24 DIAGNOSIS — E1140 Type 2 diabetes mellitus with diabetic neuropathy, unspecified: Secondary | ICD-10-CM

## 2023-02-25 ENCOUNTER — Other Ambulatory Visit (HOSPITAL_COMMUNITY): Payer: Self-pay

## 2023-02-25 ENCOUNTER — Encounter: Payer: Self-pay | Admitting: Internal Medicine

## 2023-02-25 ENCOUNTER — Telehealth: Payer: Self-pay

## 2023-02-25 MED ORDER — NOVOLOG FLEXPEN 100 UNIT/ML ~~LOC~~ SOPN: PEN_INJECTOR | SUBCUTANEOUS | 3 refills | Status: DC

## 2023-02-25 NOTE — Telephone Encounter (Signed)
Patient needs a PA for Novolog, we just switched her from [employer] (ins does not cover).

## 2023-04-03 ENCOUNTER — Other Ambulatory Visit: Payer: Self-pay | Admitting: Family Medicine

## 2023-04-03 DIAGNOSIS — R928 Other abnormal and inconclusive findings on diagnostic imaging of breast: Secondary | ICD-10-CM

## 2023-05-25 ENCOUNTER — Ambulatory Visit
Admission: RE | Admit: 2023-05-25 | Discharge: 2023-05-25 | Disposition: A | Discharge status: Home / Self Care | Payer: BC Managed Care – PPO | Source: Ambulatory Visit | Attending: Family Medicine | Admitting: Family Medicine

## 2023-05-25 DIAGNOSIS — R928 Other abnormal and inconclusive findings on diagnostic imaging of breast: Secondary | ICD-10-CM

## 2023-05-28 ENCOUNTER — Ambulatory Visit: Payer: BC Managed Care – PPO | Admitting: Internal Medicine

## 2023-05-28 ENCOUNTER — Encounter: Payer: Self-pay | Admitting: Internal Medicine

## 2023-05-28 VITALS — BP 112/62 | HR 77 | Ht 63.0 in | Wt 123.0 lb

## 2023-05-28 DIAGNOSIS — E1142 Type 2 diabetes mellitus with diabetic polyneuropathy: Secondary | ICD-10-CM | POA: Diagnosis not present

## 2023-05-28 DIAGNOSIS — E785 Hyperlipidemia, unspecified: Secondary | ICD-10-CM

## 2023-05-28 DIAGNOSIS — E1140 Type 2 diabetes mellitus with diabetic neuropathy, unspecified: Secondary | ICD-10-CM | POA: Diagnosis not present

## 2023-05-28 DIAGNOSIS — Z794 Long term (current) use of insulin: Secondary | ICD-10-CM | POA: Diagnosis not present

## 2023-05-28 LAB — POCT GLYCOSYLATED HEMOGLOBIN (HGB A1C): Hemoglobin A1C: 6.3 % — AB (ref 4.0–5.6)

## 2023-05-28 MED ORDER — FIASP FLEXTOUCH 100 UNIT/ML ~~LOC~~ SOPN: 4.0000 [IU] | PEN_INJECTOR | Freq: Three times a day (TID) | SUBCUTANEOUS | Status: DC

## 2023-05-28 NOTE — Progress Notes (Signed)
Patient ID: [name], female   DOB: Oct 17, 1960, 62 y.o.   MRN: [id number]   HPI: Carmen Anderson is a 62 y.o.-year-old female, initially referred by her PCP, Carmen Ree, NP, returning for follow-up for DM2, dx in 2001-2002, insulin-dependent, now more controlled, with complications (CAD - s/p NSTEMI 2019, s/p stent; PN). Last visit 4 months ago.  Interim history: No increased urination, blurry vision, nausea, chest pain. She has arthritic hand pain. Before last visit she had an abnormal mammogram.  This turned out to be just fat necrosis, not malignancy. She works 2 jobs but she is planning to quit her second job and only continue with the [employer] job.  This is very active, she is on her feet all the time.  Reviewed HbA1c levels: Lab Results  Component Value Date   HGBA1C 6.5 01/20/2023   HGBA1C 6.9 (A) 09/18/2022   HGBA1C 7.0 (A) 05/20/2022   HGBA1C 7.1 (A) 01/15/2022   HGBA1C 6.9 (A) 09/19/2021   HGBA1C 7.8 (A) 06/04/2021   HGBA1C 7.3 (A) 01/30/2021   HGBA1C 7.1 (A) 06/04/2020   HGBA1C 7.0 (A) 01/27/2020   HGBA1C 7.4 (A) 09/22/2019   HGBA1C 6.4 (A) 05/18/2019   HGBA1C 7.6 (A) 12/15/2018   HGBA1C 6.7 (A) 08/16/2018   HGBA1C 6.9 (A) 04/13/2018   HGBA1C 6.6 (A) 12/14/2017   HGBA1C 6.9 (H) 09/06/2017   HGBA1C 7 08/14/2017   HGBA1C 6.8 04/13/2017   HGBA1C 6.7 11/25/2016   HGBA1C 7.0 06/03/2016  06/04/2021: HbA1c calculated from fructosamine 6.8% 10/01/2020: HbA1c 7.7%  Pt is on a regimen of: - Metformin ER 500 mg once a day >> twice a day  - Jardiance 10 mg before breakfast >> 25 mg (increased 10/01/2020) - FiAsp - added 07/2021 >> NovoLog: Breakfast: 6-8 units  Lunch: 4-6 >>  (forgot) Dinner: 6-8 units - Tradjenta 5 mg before b'fast - added 01/2023 >> lightheadedness, no difference in sugars >> stopped We tried Ozempic  0.25  mg weekly - added 05/2021 >> severe nausea >> had to stop. She was previously on Januvia. She had diarrhea with higher doses of metformin  ER.  Pt checks her sugars >4x a day with her Freestyle Libre 3 CGM:  Previously:  Previously:   Lowest sugar was 38 ... >> 60 >> 50s (nighttime) >> 50s; she has hypoglycemia awareness in the 50s. Highestin: 300s >> 200s >> 200s >> 300s.  Glucometer: FreeStyle >> [facility]  Pt's meals are: - snack:  1/2 yoghurt, PB + bread  - Breakfast: wheat waffle + sugar free syrup or wheat muffin w/ 1 egg >> cereals or oatmeal; multi-grain bread - Lunch: salads or [place] sandwich on wheat bread >> banana sandwich with light mayo >> soup or white meat sandwich with multigrain bread - Dinner: soups, salad, meat 2x a weekly (chicken, salmon) + veggies >> veggies or meat + veggies and fried brown rice - Snacks:  celery She continues to exercise 4-5 times a weeks on the treadmill.  -No CKD, last BUN/creatinine:  05/20/2022: 14/0.6 Lab Results  Component Value Date   BUN 16 06/04/2021   CREATININE 0.80 06/04/2021  10/01/2020: Glucose 128, BUN/creatinine 13/0.68, GFR 96 No results found for: "MICRALBCREAT" On lisinopril 2.5.  -+ HL; last set of lipids: 05/20/2022: 138/116/45/70 Lab Results  Component Value Date   CHOL 117 09/13/2021   HDL 44 09/13/2021   LDLCALC 60 09/13/2021   TRIG 62 09/13/2021   CHOLHDL 2.7 09/13/2021  10/01/2020: 137/77/53/69 On Lipitor 80, fish  oil.  - last eye exam was 04/02/2023: No DR reportedly; [facility].   - + Numbness and tingling and occasional burning in her feet.  On low-dose Neurontin and B complex. These help.  Last foot exam 09/18/2022.  She had a NSTEMI in 08/2017.  At that time, she had a catheterization and had 90% blockage on LAD.  She had a stent placed.  She has HTN, vitamin D deficiency, controlled hypothyroidism -managed by PCP. Latest TSH levels were normal: 10/01/2020: TSH 2.4 Lab Results  Component Value Date   TSH 1.470 03/15/2020   ROS: + see HPI  I reviewed pt's medications, allergies, PMH, social hx, family hx, and changes were  documented in the history of present illness. Otherwise, unchanged from my initial visit note.  Past Medical History:  Diagnosis Date   DM type 2 (diabetes mellitus, type 2) (HCC)    History of hiatal hernia    Hyperlipidemia    Hypertension    Hypothyroidism    Past Surgical History:  Procedure Laterality Date   ABDOMINAL HYSTERECTOMY  1995   CARDIAC CATHETERIZATION     CORONARY STENT INTERVENTION N/A 09/07/2017   Procedure: CORONARY STENT INTERVENTION;  Surgeon: Runell Gess, MD;  Location: [facility];  Service: Cardiovascular;  Laterality: N/A;   LEFT HEART CATH AND CORONARY ANGIOGRAPHY N/A 09/07/2017   Procedure: LEFT HEART CATH AND CORONARY ANGIOGRAPHY;  Surgeon: Runell Gess, MD;  Location: [facility];  Service: Cardiovascular;  Laterality: N/A;   STAPEDECTOMY Right ~ 2002   Social History   Social History   Marital Status: Married    Spouse Name: N/A   Number of Children: 0   Years of Education: 12   Occupational History   property asst. Production designer, theatre/television/film    Social History Main Topics   Smoking status: Current Every Day Smoker -- 0.50 packs/day for 10 years    Types: Cigarettes   Smokeless tobacco: Not on file     Comment: pt states thinking of quitting   Alcohol Use: No   Drug Use: No   Social History Narrative   Exercise: walking, 4 times/week for 30 minutes.   Current Outpatient Medications on File Prior to Visit  Medication Sig Dispense Refill   aspirin EC 81 MG EC tablet Take 1 tablet (81 mg total) by mouth daily.     atorvastatin (LIPITOR) 80 MG tablet TAKE 1 TABLET BY MOUTH EVERY DAY 90 tablet 3   clopidogrel (PLAVIX) 75 MG tablet TAKE 1 TABLET BY MOUTH EVERY DAY 90 tablet 3   Continuous Blood Gluc Sensor (FREESTYLE LIBRE 3 SENSOR) MISC USE EVERY 14 DAYS AS DIRECTED 2 each 11   gabapentin (NEURONTIN) 100 MG capsule Take 1 capsule (100 mg total) by mouth at bedtime. 90 capsule 3   insulin aspart (NOVOLOG FLEXPEN) 100 UNIT/ML FlexPen Inject  6-10 units 3 times a day 15 mins prior to meals. 30 mL 3   Insulin Pen Needle (BD PEN NEEDLE NANO 2ND GEN) 32G X 4 MM MISC USE TO TEST 3 TIMES A DAY 300 each 3   JARDIANCE 25 MG TABS tablet Take 1 tablet (25 mg total) by mouth daily. 90 tablet 3   levothyroxine (SYNTHROID) 50 MCG tablet Take 1 tablet (50 mcg total) by mouth daily. 90 tablet 3   metFORMIN (GLUCOPHAGE-XR) 500 MG 24 hr tablet Take 1 tablet (500 mg total) by mouth 2 (two) times daily with a meal. 180 tablet 3   nitroGLYCERIN (NITROSTAT)  0.4 MG SL tablet Place 1 tablet (0.4 mg total) under the tongue every 5 (five) minutes x 3 doses as needed for chest pain. 25 tablet 3   No current facility-administered medications on file prior to visit.   Allergies  Allergen Reactions   Aspirin Other (See Comments)    Burns stomach   Codeine Rash   Family History  Problem Relation Age of Onset   Diabetes Mother    Heart disease Mother    Stroke Father        2011   Hyperlipidemia Father    Diabetes Sister    Heart disease Sister    Heart disease Brother        congenital   PE: BP 112/62   Pulse 77   Ht 5\' 3"  (1.6 m)   Wt 123 lb (55.8 kg)   SpO2 97%   BMI 21.79 kg/m   Wt Readings from Last 3 Encounters:  05/28/23 123 lb (55.8 kg)  01/20/23 125 lb 3.2 oz (56.8 kg)  09/18/22 131 lb 12.8 oz (59.8 kg)   Constitutional: normal weight, in NAD Eyes:  EOMI, no exophthalmos ENT: no neck masses, no cervical lymphadenopathy Cardiovascular: RRR, No MRG Respiratory: CTA B Musculoskeletal: no deformities Skin:no rashes Neurological: no tremor with outstretched hands  ASSESSMENT: 1. DM2, insulin-dependent, uncontrolled, with complications - CAD, s/p NSTEMI 2019, s/p stent - PN   Component     Latest Ref Rng & Units 08/08/2021  Glucose     65 - 99 mg/dL 960 (H)  C-Peptide     0.80 - 3.85 ng/mL 3.18  Islet Cell Ab     Neg:<1:1 Negative  ZNT8 Antibodies     <15 U/mL <10  Glutamic Acid Decarb Ab     <5 IU/mL <5  No  pancreatic autoimmunity or insulin deficiency.  No family history of medullary thyroid cancer or personal history of pancreatitis.  2. PN   3. HL  PLAN:  1. Patient with history of longstanding, uncontrolled, type 2 diabetes, with improved control after her NSTEMI and subsequent stent placement in 2019.  We started SGLT2 inhibitor (Jardiance) afterwards.  She also continues on metformin.  We had her on a rapid acting insulin before meals and at last visit we discussed about adding a DPP 4 inhibitor to hopefully reduce or even eliminate her mealtime insulin.  I gave her samples of Tradjenta and I advised her to let me know how she was doing with it.  If so, my plan was to continue her on DPP 4 inhibitor (Januvia 100 mg daily).  She tried [place] but she felt lightheaded and did not feel that this was impacting her blood sugars too much so she stopped.  She would prefer to continue without it. -At last visit, sugars were fluctuating within the target range, but with spikes occasionally after dinner.  She also had some blood sugar drops in the afternoon and after dinner, sometimes to the 50s.  We decreased her dose of Fiasp before lunch.  We also discussed about trying to take Fiasp in advance, 10 to 15 minutes before dinner, to see if this would illuminate her lows at night.  HbA1c at last visit was lower, at 6.5%. CGM interpretation: -At today's visit, we reviewed her CGM downloads: It appears that 92% of values are in target range (goal >70%), while 6% are higher than 180 (goal <25%), and 2% are lower than 70 (goal <4%).  The calculated average blood sugar is 116.  The projected HbA1c for the next 3 months (GMI) is 6.1%. -Reviewing the CGM trends, sugars appear to be fluctuating within the target range but she has some lows at night and late afternoon.  Upon questioning, she forgot to reduce her insulin dose with lunch >> will do this now.  In fact, due to the scattered lows throughout the day and  night, I advised her to also reduce the dose of her insulin with breakfast and dinner.  She tells me that she was not able to find [place] and she is now on NovoLog.  However, she recently changed her pharmacy and I advised her to see if her new [facility] has Fiasp in stock and if so, to switch to this.  I did advise her that she may need to take it approximately 5 to 10 minutes before dinner to avoid hyperglycemia after this meal. - I suggested to:  Patient Instructions  Please continue: - Metformin ER 500 mg 2x a day with meals - Jardiance 25 mg before breakfast - NovoLog/FiAsp  Breakfast: 6-8 >> 4-6 units  Lunch: 6 >> 3-4 units Dinner: 6-8 units >> 5-6 units  Please return in 4 months.  - we checked her HbA1c: 6.3% (lower) - advised to check sugars at different times of the day - 4x a day, rotating check times - advised for yearly eye exams >> she is UTD - need records - return to clinic in 4 months   2. Diabetic PN - stable - She continues on low-dose Neurontin, 100 mg daily and also B complex  3. HL - Reviewed latest lipid panel from 05/2022: Fractions at goal with the exception of an LDL slightly above 55, which is our target due to history of cardiovascular disease - Continues the statin (Lipitor 80 mg daily) and fish oil without side effects. - She is due for another lipid panel -will check today  Addendum (06/02/2023): Patient did not stop at the lab... Will check these at next visit.  Carlus Pavlov, MD PhD [facility] Endocrinology

## 2023-05-28 NOTE — Patient Instructions (Addendum)
Please continue: - Metformin ER 500 mg 2x a day with meals - Jardiance 25 mg before breakfast - NovoLog/FiAsp  Breakfast: 6-8 >> 4-6 units  Lunch: 6 >> 3-4 units Dinner: 6-8 units >> 5-6 units  Please return in 4 months.

## 2023-06-01 ENCOUNTER — Other Ambulatory Visit: Payer: Self-pay | Admitting: Internal Medicine

## 2023-06-20 ENCOUNTER — Other Ambulatory Visit: Payer: Self-pay | Admitting: Internal Medicine

## 2023-07-11 LAB — COLOGUARD: COLOGUARD: NEGATIVE

## 2023-07-11 LAB — EXTERNAL GENERIC LAB PROCEDURE: COLOGUARD: NEGATIVE

## 2023-07-17 ENCOUNTER — Other Ambulatory Visit: Payer: Self-pay | Admitting: Internal Medicine

## 2023-07-17 DIAGNOSIS — E1140 Type 2 diabetes mellitus with diabetic neuropathy, unspecified: Secondary | ICD-10-CM

## 2023-08-10 ENCOUNTER — Other Ambulatory Visit: Payer: Self-pay | Admitting: Physician Assistant

## 2023-08-10 ENCOUNTER — Encounter: Payer: Self-pay | Admitting: Internal Medicine

## 2023-08-10 DIAGNOSIS — I214 Non-ST elevation (NSTEMI) myocardial infarction: Secondary | ICD-10-CM

## 2023-08-10 MED ORDER — NOVOLOG FLEXPEN 100 UNIT/ML ~~LOC~~ SOPN: 4.0000 [IU] | PEN_INJECTOR | Freq: Three times a day (TID) | SUBCUTANEOUS | 3 refills | Status: AC

## 2023-10-01 ENCOUNTER — Other Ambulatory Visit: Payer: Self-pay | Admitting: Internal Medicine

## 2023-10-16 LAB — LAB REPORT - SCANNED
A1c: 6.7
Albumin, Urine POC: <0.2
Creatinine, POC: 9 mg/dL
EGFR: 99
TSH: 2.03 (ref 0.41–5.90)

## 2023-10-26 ENCOUNTER — Other Ambulatory Visit: Payer: Self-pay

## 2023-10-26 ENCOUNTER — Ambulatory Visit
Admission: EM | Admit: 2023-10-26 | Discharge: 2023-10-26 | Disposition: A | Discharge status: Home / Self Care | Attending: Family Medicine | Admitting: Family Medicine

## 2023-10-26 ENCOUNTER — Encounter: Payer: Self-pay | Admitting: *Deleted

## 2023-10-26 DIAGNOSIS — S0101XA Laceration without foreign body of scalp, initial encounter: Secondary | ICD-10-CM | POA: Diagnosis not present

## 2023-10-26 MED ORDER — TETANUS-DIPHTH-ACELL PERTUSSIS 5-2.5-18.5 LF-MCG/0.5 IM SUSY
0.5000 mL | PREFILLED_SYRINGE | Freq: Once | INTRAMUSCULAR | Status: AC
  Administered 2023-10-26: 0.5 mL via INTRAMUSCULAR

## 2023-10-26 MED ORDER — CEPHALEXIN 500 MG PO CAPS: 500.0000 mg | ORAL_CAPSULE | Freq: Two times a day (BID) | ORAL | 0 refills | Status: AC

## 2023-10-26 NOTE — ED Triage Notes (Addendum)
 Pt reports she bumped her head on the counter at work . States she is on blood thinners and bled for a while. Bandage in place during triage- no active bleeding. Triangular lac present at hairline. Last tetanus 2014

## 2023-10-26 NOTE — Discharge Instructions (Signed)
 Start Keflex tomorrow take twice daily for 5 days this is to prevent from an infection.  You will return here in 10 days to have your staples removed.  Placed a total of 3 staples.  Wait at least 24 hours before washing your hair with hair products however you can wash with plain soap and water tonight.  For any headache or localized pain take Tylenol.

## 2023-10-26 NOTE — ED Provider Notes (Incomplete)
 [facility]    CSN: [id number] Arrival date & time: 10/26/23  1702      History   Chief Complaint Chief Complaint  Patient presents with   Head Injury    HPI DEAIRA Anderson is a 63 y.o. female.    Head Injury Set dollar tree reports hitting her head on the edge of a corner and a laceration on the left frontal scalp upper  Past Medical History:  Diagnosis Date   DM type 2 (diabetes mellitus, type 2) (HCC)    History of hiatal hernia    Hyperlipidemia    Hypertension    Hypothyroidism     Patient Active Problem List   Diagnosis Date Noted   Vitamin D deficiency 03/15/2020   Age-related osteoporosis without current pathological fracture 03/15/2020   Iatrogenic hypotension 06/19/2018   Former smoker 06/19/2018   Diabetes mellitus with cardiac complication (HCC) 06/19/2018   Non-ST elevation (NSTEMI) myocardial infarction (HCC) 09/08/2017   Elevated troponin    Abnormal cardiovascular stress test    Essential hypertension    Chest pain 09/04/2017   Diabetic peripheral neuropathy associated with type 2 diabetes mellitus (HCC) 04/13/2017   Type 2 diabetes mellitus with diabetic neuropathy, without long-term current use of insulin (HCC) 09/25/2015   Hyperlipidemia    Allergic rhinitis    Hypothyroid     Past Surgical History:  Procedure Laterality Date   ABDOMINAL HYSTERECTOMY  1995   CARDIAC CATHETERIZATION     CORONARY STENT INTERVENTION N/A 09/07/2017   Procedure: CORONARY STENT INTERVENTION;  Surgeon: Avanell Leigh, MD;  Location: [facility];  Service: Cardiovascular;  Laterality: N/A;   LEFT HEART CATH AND CORONARY ANGIOGRAPHY N/A 09/07/2017   Procedure: LEFT HEART CATH AND CORONARY ANGIOGRAPHY;  Surgeon: Avanell Leigh, MD;  Location: [facility];  Service: Cardiovascular;  Laterality: N/A;   STAPEDECTOMY Right ~ 2002    OB History   No obstetric history on file.      Home Medications    Prior to Admission  medications   Medication Sig Start Date End Date Taking? Authorizing Provider  aspirin EC 81 MG EC tablet Take 1 tablet (81 mg total) by mouth daily. 09/09/17  Yes Meng, Hao, PA  atorvastatin (LIPITOR) 80 MG tablet TAKE 1 TABLET BY MOUTH EVERY DAY 10/23/22  Yes Jude Norton, NP  clopidogrel (PLAVIX) 75 MG tablet TAKE 1 TABLET BY MOUTH DAILY 08/11/23  Yes Millicent Ally, MD  Continuous Glucose Sensor (FREESTYLE LIBRE 3 SENSOR) MISC APPLY SENSOR TO SKIN FOR CONTINUOUS BLOOD SUGAR MONITORING, REPLACE EVERY 14 DAYS 10/02/23  Yes Emilie Harden, MD  gabapentin (NEURONTIN) 100 MG capsule Take 1 capsule (100 mg total) by mouth at bedtime. 10/19/19  Yes Stallings, Zoe A, MD  insulin aspart (NOVOLOG FLEXPEN) 100 UNIT/ML FlexPen Inject 4-6 Units into the skin 3 (three) times daily. 08/10/23  Yes Emilie Harden, MD  Insulin Pen Needle (BD PEN NEEDLE NANO 2ND GEN) 32G X 4 MM MISC USE TO TEST 3 TIMES A DAY 01/20/23  Yes Emilie Harden, MD  JARDIANCE 25 MG TABS tablet TAKE 1 TABLET BY MOUTH DAILY 06/22/23  Yes Emilie Harden, MD  levothyroxine (SYNTHROID) 50 MCG tablet Take 1 tablet (50 mcg total) by mouth daily. 10/19/19  Yes Stallings, Zoe A, MD  metFORMIN (GLUCOPHAGE-XR) 500 MG 24 hr tablet TAKE 1 TABLET BY MOUTH 2 TIMES A DAY WITH A MEAL 07/17/23  Yes Emilie Harden, MD  nitroGLYCERIN (NITROSTAT) 0.4 MG  SL tablet Place 1 tablet (0.4 mg total) under the tongue every 5 (five) minutes x 3 doses as needed for chest pain. 08/08/19  Yes Lennette Bihari, MD  insulin aspart (FIASP FLEXTOUCH) 100 UNIT/ML FlexTouch Pen Inject 4-6 Units into the skin 3 (three) times daily. 05/28/23   Carlus Pavlov, MD    Family History Family History  Problem Relation Age of Onset   Diabetes Mother    Heart disease Mother    Stroke Father        2011   Hyperlipidemia Father    Diabetes Sister    Heart disease Sister    Heart disease Brother        congenital    Social History Social History   Tobacco Use    Smoking status: Former    Current packs/day: 0.00    Average packs/day: 0.5 packs/day for 27.0 years (13.5 ttl pk-yrs)    Types: Cigarettes    Start date: 09/05/1990    Quit date: 09/05/2017    Years since quitting: 6.1   Smokeless tobacco: Never  Vaping Use   Vaping status: Never Used  Substance Use Topics   Alcohol use: No   Drug use: No     Allergies   Aspirin and Codeine   Review of Systems Review of Systems   Physical Exam Triage Vital Signs ED Triage Vitals  Encounter Vitals Group     BP 10/26/23 1748 (!) 154/71     Systolic BP Percentile --      Diastolic BP Percentile --      Pulse Rate 10/26/23 1748 60     Resp 10/26/23 1748 18     Temp 10/26/23 1748 98.3 F (36.8 C)     Temp Source 10/26/23 1748 Oral     SpO2 10/26/23 1748 98 %     Weight --      Height --      Head Circumference --      Peak Flow --      Pain Score 10/26/23 1745 3     Pain Loc --      Pain Education --      Exclude from Growth Chart --    No data found.  Updated Vital Signs BP (!) 154/71 (BP Location: Left Arm)   Pulse 60   Temp 98.3 F (36.8 C) (Oral)   Resp 18   SpO2 98%   Visual Acuity Right Eye Distance:   Left Eye Distance:   Bilateral Distance:    Right Eye Near:   Left Eye Near:    Bilateral Near:     Physical Exam   UC Treatments / Results  Labs (all labs ordered are listed, but only abnormal results are displayed) Labs Reviewed - No data to display  EKG   Radiology No results found.  Procedures Procedures (including critical care time)  Medications Ordered in UC Medications - No data to display  Initial Impression / Assessment and Plan / UC Course  I have reviewed the triage vital signs and the nursing notes.  Pertinent labs & imaging results that were available during my care of the patient were reviewed by me and considered in my medical decision making (see chart for details).     *** Final Clinical Impressions(s) / UC Diagnoses    Final diagnoses:  None   Discharge Instructions   None    ED Prescriptions   None    PDMP not reviewed this encounter.

## 2023-10-27 ENCOUNTER — Ambulatory Visit: Payer: BC Managed Care – PPO | Admitting: Internal Medicine

## 2023-10-27 ENCOUNTER — Encounter: Payer: Self-pay | Admitting: Internal Medicine

## 2023-10-27 VITALS — BP 120/70 | HR 63 | Ht 63.0 in | Wt 115.0 lb

## 2023-10-27 DIAGNOSIS — E1140 Type 2 diabetes mellitus with diabetic neuropathy, unspecified: Secondary | ICD-10-CM

## 2023-10-27 DIAGNOSIS — E1142 Type 2 diabetes mellitus with diabetic polyneuropathy: Secondary | ICD-10-CM

## 2023-10-27 DIAGNOSIS — Z794 Long term (current) use of insulin: Secondary | ICD-10-CM

## 2023-10-27 DIAGNOSIS — E785 Hyperlipidemia, unspecified: Secondary | ICD-10-CM

## 2023-10-27 NOTE — Patient Instructions (Addendum)
 Please continue: - Metformin ER 500 mg 2x a day with meals - Jardiance 25 mg before breakfast - NovoLog: Breakfast: 4-6 units  Lunch: 3-4 units Dinner: 5-6 units  Include fruit (berries) and healthy fats (unsalted nuts, chia seeds, ground flax seeds, avocado).  Please return in 4 months.

## 2023-10-27 NOTE — Progress Notes (Signed)
 Patient ID: Carmen Anderson, female   DOB: 02-Jul-1961, 63 y.o.   MRN: [id number]   HPI: Carmen Anderson is a 63 y.o.-year-old female, initially referred by her PCP, Carmen Dawley, NP, returning for follow-up for DM2, dx in 2001-2002, insulin-dependent, now more controlled, with complications (CAD - s/p NSTEMI 2019, s/p stent; PN). Last visit 5 months ago.  Interim history: No increased urination, blurry vision, nausea, chest pain. She has arthritic hand pain. She was in urgent care last night after she hit her head at work.  She had to have 3 stitches.  She is feeling well now.  Reviewed HbA1c levels: 10/16/2023: HbA1c 6.7% Lab Results  Component Value Date   HGBA1C 6.3 (A) 05/28/2023   HGBA1C 6.5 01/20/2023   HGBA1C 6.9 (A) 09/18/2022   HGBA1C 7.0 (A) 05/20/2022   HGBA1C 7.1 (A) 01/15/2022   HGBA1C 6.9 (A) 09/19/2021   HGBA1C 7.8 (A) 06/04/2021   HGBA1C 7.3 (A) 01/30/2021   HGBA1C 7.1 (A) 06/04/2020   HGBA1C 7.0 (A) 01/27/2020   HGBA1C 7.4 (A) 09/22/2019   HGBA1C 6.4 (A) 05/18/2019   HGBA1C 7.6 (A) 12/15/2018   HGBA1C 6.7 (A) 08/16/2018   HGBA1C 6.9 (A) 04/13/2018   HGBA1C 6.6 (A) 12/14/2017   HGBA1C 6.9 (H) 09/06/2017   HGBA1C 7 08/14/2017   HGBA1C 6.8 04/13/2017   HGBA1C 6.7 11/25/2016  06/04/2021: HbA1c calculated from fructosamine 6.8% 10/01/2020: HbA1c 7.7%  Pt is on a regimen of: - Metformin ER 500 mg once a day >> twice a day  - Jardiance 10 mg before breakfast >> 25 mg (increased 10/01/2020) - FiAsp - added 07/2021 >> NovoLog/Fiasp: Breakfast: 6-8 >> 4-6 units  Lunch: 6 >> 3-4 units Dinner: 6-8 units >> 5-6 units - Tradjenta 5 mg before b'fast - added 01/2023 >> lightheadedness, no difference in sugars >> stopped We tried Ozempic  0.25  mg weekly - added 05/2021 >> severe nausea >> had to stop. She was previously on Januvia. She had diarrhea with higher doses of metformin ER.  Pt checks her sugars >4x a day with her Freestyle Libre 3  CGM:  Previously:  Previously:   Lowest sugar was 38 ...  >> 50s; she has hypoglycemia awareness in the 50s. Highestin: 200s >> 300s.  Glucometer: FreeStyle >> [facility]  Pt's meals are: - snack:  1/2 yoghurt, PB + bread >> whole grain sugar-free + plain oatmeal or grits + toast - Breakfast: wheat waffle + sugar free syrup or wheat muffin w/ 1 egg >> cereals or oatmeal; multi-grain bread - Lunch: salads or [place] sandwich on wheat bread >> banana sandwich with light mayo >> soup or white meat sandwich with multigrain bread - Dinner: soups, salad, meat 2x a weekly (chicken, salmon) + veggies >> veggies or meat + veggies and fried brown rice - Snacks:  celery She continues to exercise 4-5 times a weeks on the treadmill.  -No CKD, last BUN/creatinine:  10/16/2023: 9/0.67, GFR 99, glucose 216, ACR undetectable 05/20/2022: 14/0.6 Lab Results  Component Value Date   BUN 16 06/04/2021   CREATININE 0.80 06/04/2021  10/01/2020: Glucose 128, BUN/creatinine 13/0.68, GFR 96 No results found for: "MICRALBCREAT" On lisinopril 2.5.  -+ HL; last set of lipids: 10/16/2023: 120/95/49/53 05/20/2022: 138/116/45/70 Lab Results  Component Value Date   CHOL 117 09/13/2021   HDL 44 09/13/2021   LDLCALC 60 09/13/2021   TRIG 62 09/13/2021   CHOLHDL 2.7 09/13/2021  On Lipitor 80, fish oil.  - last eye exam was  04/02/2023: No DR reportedly; [facility].   - + Numbness and tingling and occasional burning in her feet.  On low-dose Neurontin and B complex. These help.  Last foot exam 09/18/2022.  She had a NSTEMI in 08/2017.  At that time, she had a catheterization and had 90% blockage on LAD.  She had a stent placed.  She has HTN, vitamin D deficiency, controlled hypothyroidism -managed by PCP. Latest TSH levels were normal: 10/16/2023: TSH 2.03 10/01/2020: TSH 2.4 Lab Results  Component Value Date   TSH 2.03 10/16/2023   ROS: + see HPI  I reviewed pt's medications, allergies, PMH, social hx, family  hx, and changes were documented in the history of present illness. Otherwise, unchanged from my initial visit note.  Past Medical History:  Diagnosis Date   DM type 2 (diabetes mellitus, type 2) (HCC)    History of hiatal hernia    Hyperlipidemia    Hypertension    Hypothyroidism    Past Surgical History:  Procedure Laterality Date   ABDOMINAL HYSTERECTOMY  1995   CARDIAC CATHETERIZATION     CORONARY STENT INTERVENTION N/A 09/07/2017   Procedure: CORONARY STENT INTERVENTION;  Surgeon: Avanell Leigh, MD;  Location: [facility];  Service: Cardiovascular;  Laterality: N/A;   LEFT HEART CATH AND CORONARY ANGIOGRAPHY N/A 09/07/2017   Procedure: LEFT HEART CATH AND CORONARY ANGIOGRAPHY;  Surgeon: Avanell Leigh, MD;  Location: [facility];  Service: Cardiovascular;  Laterality: N/A;   STAPEDECTOMY Right ~ 2002   Social History   Social History   Marital Status: Married    Spouse Name: N/A   Number of Children: 0   Years of Education: 12   Occupational History   property asst. Production designer, theatre/television/film    Social History Main Topics   Smoking status: Current Every Day Smoker -- 0.50 packs/day for 10 years    Types: Cigarettes   Smokeless tobacco: Not on file     Comment: pt states thinking of quitting   Alcohol Use: No   Drug Use: No   Social History Narrative   Exercise: walking, 4 times/week for 30 minutes.   Current Outpatient Medications on File Prior to Visit  Medication Sig Dispense Refill   aspirin EC 81 MG EC tablet Take 1 tablet (81 mg total) by mouth daily.     atorvastatin (LIPITOR) 80 MG tablet TAKE 1 TABLET BY MOUTH EVERY DAY 90 tablet 3   cephALEXin (KEFLEX) 500 MG capsule Take 1 capsule (500 mg total) by mouth 2 (two) times daily for 5 days. 10 capsule 0   clopidogrel (PLAVIX) 75 MG tablet TAKE 1 TABLET BY MOUTH DAILY 90 tablet 0   Continuous Glucose Sensor (FREESTYLE LIBRE 3 SENSOR) MISC APPLY SENSOR TO SKIN FOR CONTINUOUS BLOOD SUGAR MONITORING, REPLACE EVERY  14 DAYS 3 each 2   gabapentin (NEURONTIN) 100 MG capsule Take 1 capsule (100 mg total) by mouth at bedtime. 90 capsule 3   insulin aspart (FIASP FLEXTOUCH) 100 UNIT/ML FlexTouch Pen Inject 4-6 Units into the skin 3 (three) times daily.     insulin aspart (NOVOLOG FLEXPEN) 100 UNIT/ML FlexPen Inject 4-6 Units into the skin 3 (three) times daily. 30 mL 3   Insulin Pen Needle (BD PEN NEEDLE NANO 2ND GEN) 32G X 4 MM MISC USE TO TEST 3 TIMES A DAY 300 each 3   JARDIANCE 25 MG TABS tablet TAKE 1 TABLET BY MOUTH DAILY 90 tablet 3   levothyroxine (SYNTHROID) 50 MCG tablet  Take 1 tablet (50 mcg total) by mouth daily. 90 tablet 3   metFORMIN (GLUCOPHAGE-XR) 500 MG 24 hr tablet TAKE 1 TABLET BY MOUTH 2 TIMES A DAY WITH A MEAL 180 tablet 3   nitroGLYCERIN (NITROSTAT) 0.4 MG SL tablet Place 1 tablet (0.4 mg total) under the tongue every 5 (five) minutes x 3 doses as needed for chest pain. 25 tablet 3   No current facility-administered medications on file prior to visit.   Allergies  Allergen Reactions   Aspirin Other (See Comments)    Burns stomach   Codeine Rash   Family History  Problem Relation Age of Onset   Diabetes Mother    Heart disease Mother    Stroke Father        2011   Hyperlipidemia Father    Diabetes Sister    Heart disease Sister    Heart disease Brother        congenital   PE: BP 120/70   Pulse 63   Ht 5\' 3"  (1.6 m)   Wt 115 lb (52.2 kg)   SpO2 95%   BMI 20.37 kg/m   Wt Readings from Last 3 Encounters:  10/27/23 115 lb (52.2 kg)  05/28/23 123 lb (55.8 kg)  01/20/23 125 lb 3.2 oz (56.8 kg)   Constitutional: normal weight, in NAD Eyes:  EOMI, no exophthalmos ENT: no neck masses, no cervical lymphadenopathy Cardiovascular: RRR, No MRG Respiratory: CTA B Musculoskeletal: no deformities Skin:no rashes Neurological: no tremor with outstretched hands  ASSESSMENT: 1. DM2, insulin-dependent, uncontrolled, with complications - CAD, s/p NSTEMI 2019, s/p stent - PN    Component     Latest Ref Rng & Units 08/08/2021  Glucose     65 - 99 mg/dL 782 (H)  C-Peptide     0.80 - 3.85 ng/mL 3.18  Islet Cell Ab     Neg:<1:1 Negative  ZNT8 Antibodies     <15 U/mL <10  Glutamic Acid Decarb Ab     <5 IU/mL <5  No pancreatic autoimmunity or insulin deficiency.  No family history of medullary thyroid cancer or personal history of pancreatitis.  2. PN   3. HL  PLAN:  1. Patient with longstanding, uncontrolled, type 2 diabetes, with improved control after her NSTEMI and subsequent stent placement in 2019.  Restarted on SGLT2 inhibitor afterwards.  She also continues on metformin and we had to add mealtime insulin, also.  We tried to use a DPP 4 inhibitor instead of the mealtime insulin (Tradjenta) but she felt lightheaded and did not feel that this was impacting her blood sugars too much so she stopped.  She would prefer to continue without it.  At last visit, HbA1c was better, at 6.3%, but earlier this month she had another HbA1c checked, at 6.7%. - At last visit, sugars were fluctuating within the target range but she had some lows in the afternoon.  She forgot to reduce the dose of her insulin with lunch so I again advised her to do so.  We also adjusted the rest of the doses.  I did advise her that she may need to take Fiasp 5 to 10 minutes before dinner to avoid hyperglycemia after this meal. CGM interpretation: -At today's visit, we reviewed her CGM downloads: It appears that 91% of values are in target range (goal >70%), while 8% are higher than 180 (goal <25%), and 1% are lower than 70 (goal <4%).  The calculated average blood sugar is 118.  The projected HbA1c  for the next 3 months (GMI) is 6.1%. -Reviewing the CGM trends, sugars appear to be fairly well-controlled but they are higher after breakfast and there is a trend towards higher blood sugars after lunch.  She has abrupt increases in blood sugars after some of her meals despite the fact that she is  taking NovoLog 15 to 20 units before meals.  We discussed about including healthy fats into her diet to dissipate the increase in blood sugars over a longer period of time.  Given examples.  Also, I advised her that she may need to slightly higher doses of NovoLog for meals that increase her blood sugars significantly.  Otherwise, we will continue the same regimen. - I suggested to:  Patient Instructions  Please continue: - Metformin ER 500 mg 2x a day with meals - Jardiance 25 mg before breakfast - NovoLog: Breakfast: 4-6 units  Lunch: 3-4 units Dinner: 5-6 units  Include fruit (berries) and healthy fats (unsalted nuts, chia seeds, ground flax seeds, avocado).  Please return in 4 months.  - advised to check sugars at different times of the day - 4x a day, rotating check times - advised for yearly eye exams >> she is UTD - return to clinic in 4 months   2. Diabetic PN - Stable - She continues on low-dose Neurontin, 100 mg daily, and also B complex  3. HL - Latest lipid panel was reviewed from 10/16/2023: At goal, with the exception of a slightly low HDL - She continues Lipitor 80 mg daily and fish oil-no side effects  Emilie Harden, MD PhD [facility] Endocrinology

## 2023-11-05 ENCOUNTER — Ambulatory Visit
Admission: RE | Admit: 2023-11-05 | Discharge: 2023-11-05 | Disposition: A | Discharge status: Home / Self Care | Payer: Self-pay | Source: Ambulatory Visit | Attending: Family Medicine | Admitting: Family Medicine

## 2023-11-05 ENCOUNTER — Ambulatory Visit
Admission: RE | Admit: 2023-11-05 | Discharge: 2023-11-05 | Disposition: A | Discharge status: Home / Self Care | Payer: Self-pay | Source: Ambulatory Visit

## 2023-11-05 ENCOUNTER — Encounter: Payer: Self-pay | Admitting: Emergency Medicine

## 2023-11-05 DIAGNOSIS — Z4802 Encounter for removal of sutures: Secondary | ICD-10-CM

## 2023-11-05 NOTE — ED Triage Notes (Signed)
 Pt presents needing staple removal. No complications to report.

## 2023-11-06 NOTE — ED Provider Notes (Signed)
 Staple remover completed by nurse   Buena Carmine, NP 11/06/23 [phone]

## 2023-11-13 ENCOUNTER — Other Ambulatory Visit: Payer: Self-pay

## 2023-11-13 DIAGNOSIS — I214 Non-ST elevation (NSTEMI) myocardial infarction: Secondary | ICD-10-CM

## 2023-11-13 MED ORDER — CLOPIDOGREL BISULFATE 75 MG PO TABS
75.0000 mg | ORAL_TABLET | Freq: Every day | ORAL | 0 refills | Status: DC
Start: 2023-11-13 — End: 2024-03-02

## 2023-11-20 ENCOUNTER — Encounter: Payer: Self-pay | Admitting: Internal Medicine

## 2023-11-20 MED ORDER — FREESTYLE LIBRE 3 PLUS SENSOR MISC: 1.0000 | 3 refills | Status: AC

## 2023-11-27 ENCOUNTER — Ambulatory Visit: Payer: BC Managed Care – PPO | Attending: Cardiovascular Disease | Admitting: Cardiovascular Disease

## 2023-11-27 ENCOUNTER — Encounter: Payer: Self-pay | Admitting: Cardiovascular Disease

## 2023-11-27 DIAGNOSIS — E1159 Type 2 diabetes mellitus with other circulatory complications: Secondary | ICD-10-CM

## 2023-11-27 DIAGNOSIS — I10 Essential (primary) hypertension: Secondary | ICD-10-CM

## 2023-11-27 DIAGNOSIS — I214 Non-ST elevation (NSTEMI) myocardial infarction: Secondary | ICD-10-CM | POA: Diagnosis not present

## 2023-11-27 DIAGNOSIS — I6529 Occlusion and stenosis of unspecified carotid artery: Secondary | ICD-10-CM | POA: Diagnosis not present

## 2023-11-27 DIAGNOSIS — I2510 Atherosclerotic heart disease of native coronary artery without angina pectoris: Secondary | ICD-10-CM | POA: Diagnosis not present

## 2023-11-27 DIAGNOSIS — E785 Hyperlipidemia, unspecified: Secondary | ICD-10-CM

## 2023-11-27 NOTE — Progress Notes (Signed)
 Okay right knee stat  Cardiology Office Note    Date:  11/29/2023   ID:  Carmen Anderson, DOB 1960/11/29, MRN [id number]  PCP:  Allene Ivan, MD  Cardiologist:  Magnus Schuller, MD   No chief complaint on file.   History of Present Illness:  Carmen Anderson is a 63 y.o. female who has a history of hyperlipidemia, diabetes mellitus, hypothyroidism who suffered a non-ST segment elevation MI.  She was last seen by me on September 11, 2020.  She presents for a 50-month follow-up evaluation.    [name] was hospitalized on September 04, 2017 after developing chest pain with left arm radiation associated with shortness of breath.  I had seen her for cardiology consultation during her evaluation.  She had smoked for approximately 10 years, quit for 5 years, and then had been smoking for another 10 years.  She ultimately ruled in for non-ST segment MI.  A nuclear study showed a medium defect of moderate severity in the mid anteroseptal apical anterior apical septal location.  An echo Doppler study showed an EF of 60 to 65%.  She underwent cardiac catheterization of fiber 25 2019 which showed a 90% proximal to mid LAD stenosis successfully treated with DES stenting by Dr. Katheryne Pane and a 2.25 x 16 mm Synergy stent was inserted.  Subsequently, she stabilized.  Initial LDL cholesterol elevated at 191.  She was on atorvastatin  80 mg daily for hyperlipidemia and 3 weeks ago repeat laboratory showed an LDL cholesterol improved to 52.  She is diabetic on Jardiance  10 mg in addition to metformin .  She has been on aspirin  and Brilinta  for dual antiplatelet therapy.  She is on lisinopril  5 mg daily.  She has hypothyroidism on levothyroxine  at 50 mcg.  She is on Jardiance  in addition to metformin  for diabetes mellitus.  When I saw her in May 2019 she was doing well and was about to start cardiac rehab.   I saw her in December 2019 at which time she denied any recurrent episodes of chest pain.  There was resolution of her prior  exertional dyspnea.  She was working at [employer].  She denied palpitations .  She had mild orthostatic hypotension and I reduced her lisinopril  down to 2.5 mg.  She was  evaluated by Marcie Sever, PA in a telemedicine visit in June 2020.  She was not having anginal symptoms.  Her Brilinta  was discontinued due to cost and she was switched to clopidogrel .  She was on Jardiance  and Metformin  for her diabetes mellitus followed by Dr. Aldona Amel.  She continued to be on atorvastatin  for hyperlipidemia.  I saw her on August 08, 2019.  Since her prior evaluation she had continued to do well.  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  She denies chest pain PND orthopnea or palpitations.  She continue be on aspirin  and Plavix  for CAD in addition to Jardiance  and Metformin  for diabetes mellitus.  She was on levothyroxine  for hypothyroidism.  I last saw her on September 11, 2020.  At that time she remained stable.  She was planning to since change her primary care physician and will be establishing care with Dr. Bertell Broach.  She denies chest pain, shortness of breath, palpitations, presyncope or syncope.  Laboratory in September 2021 showed an LDL cholesterol at 56.  Hemoglobin A1c was increased at 7.1.  Renal function was normal with a creatinine of 0.73.    Since  I saw her, she was evaluated by Phyliss Breen, PA-C on September 11, 2021 and most recently by Marlana Silvan, NP on September 15, 2022.  She continued to do well from a cardiac standpoint.  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  She denies chest pain or shortness of breath.  She continues to be on DAPT with aspirin /Plavix , is on atorvastatin  80 mg for hyperlipidemia, levothyroxine  50 mcg for hypothyroidism and is diabetic on Jardiance  metformin  and insulin .  She is on gabapentin  for neuropathy.  She recently had lab work by Dr. Marelyn Shan on October 16, 2023 which  showed total cholesterol 120, HDL 49, LDL 53 and triglycerides less than 150.  TSH was 2.03.  She presents for evaluation.   Past Medical History:  Diagnosis Date   DM type 2 (diabetes mellitus, type 2) (HCC)    History of hiatal hernia    Hyperlipidemia    Hypertension    Hypothyroidism     Past Surgical History:  Procedure Laterality Date   ABDOMINAL HYSTERECTOMY  1995   CARDIAC CATHETERIZATION     CORONARY STENT INTERVENTION N/A 09/07/2017   Procedure: CORONARY STENT INTERVENTION;  Surgeon: Avanell Leigh, MD;  Location: [facility];  Service: Cardiovascular;  Laterality: N/A;   LEFT HEART CATH AND CORONARY ANGIOGRAPHY N/A 09/07/2017   Procedure: LEFT HEART CATH AND CORONARY ANGIOGRAPHY;  Surgeon: Avanell Leigh, MD;  Location: [facility];  Service: Cardiovascular;  Laterality: N/A;   STAPEDECTOMY Right ~ 2002    Current Medications: Outpatient Medications Prior to Visit  Medication Sig Dispense Refill   aspirin  EC 81 MG EC tablet Take 1 tablet (81 mg total) by mouth daily.     atorvastatin  (LIPITOR ) 80 MG tablet TAKE 1 TABLET BY MOUTH EVERY DAY 90 tablet 3   clopidogrel  (PLAVIX ) 75 MG tablet Take 1 tablet (75 mg total) by mouth daily. 90 tablet 0   Continuous Glucose Sensor (FREESTYLE LIBRE 3 PLUS SENSOR) MISC Inject 1 Device into the skin continuous. Change every 15 days 6 each 3   gabapentin  (NEURONTIN ) 100 MG capsule Take 1 capsule (100 mg total) by mouth at bedtime. 90 capsule 3   insulin  aspart (NOVOLOG  FLEXPEN) 100 UNIT/ML FlexPen Inject 4-6 Units into the skin 3 (three) times daily. 30 mL 3   Insulin  Pen Needle (BD PEN NEEDLE NANO 2ND GEN) 32G X 4 MM MISC USE TO TEST 3 TIMES A DAY 300 each 3   JARDIANCE  25 MG TABS tablet TAKE 1 TABLET BY MOUTH DAILY 90 tablet 3   levothyroxine  (SYNTHROID ) 50 MCG tablet Take 1 tablet (50 mcg total) by mouth daily. 90 tablet 3   metFORMIN  (GLUCOPHAGE -XR) 500 MG 24 hr tablet TAKE 1 TABLET BY MOUTH 2 TIMES A DAY WITH A  MEAL 180 tablet 3   nitroGLYCERIN  (NITROSTAT ) 0.4 MG SL tablet Place 1 tablet (0.4 mg total) under the tongue every 5 (five) minutes x 3 doses as needed for chest pain. 25 tablet 3   No facility-administered medications prior to visit.     Allergies:   Aspirin  and Codeine   Social History   Socioeconomic History   Marital status: Married    Spouse name: Not on file   Number of children: Not on file   Years of education: 12   Highest education level: Not on file  Occupational History   Occupation: property asst. Event organiser: [employer]  Tobacco Use   Smoking status: Former    Current packs/day: 0.00    Average packs/day: 0.5 packs/day for 27.0 years (13.5 ttl pk-yrs)    Types: Cigarettes    Start date: 09/05/1990    Quit date: 09/05/2017    Years since quitting: 6.2   Smokeless tobacco: Never  Vaping Use   Vaping status: Never Used  Substance and Sexual Activity   Alcohol use: No   Drug use: No   Sexual activity: Yes    Birth control/protection: None  Other Topics Concern   Not on file  Social History Narrative   Exercise: walking, 4 times/week for 30 minutes.   Social Drivers of Corporate investment banker Strain: Not on file  Food Insecurity: Not on file  Transportation Needs: Not on file  Physical Activity: Not on file  Stress: Not on file  Social Connections: Not on file    Quit tobacco on September 04, 2017  Family History:  The patient's family history includes Diabetes in her mother and sister; Heart disease in her brother, mother, and sister; Hyperlipidemia in her father; Stroke in her father.   ROS General: Negative; No fevers, chills, or night sweats;  HEENT: Negative; No changes in vision or hearing, sinus congestion, difficulty swallowing Pulmonary: Negative; No cough, wheezing, shortness of breath, hemoptysis Cardiovascular: See HPI GI: Negative; No nausea, vomiting, diarrhea, or abdominal pain GU: Negative; No dysuria,  hematuria, or difficulty voiding Musculoskeletal: Negative; no myalgias, joint pain, or weakness Hematologic/Oncology: Negative; no easy bruising, bleeding Endocrine: Negative; no heat/cold intolerance; no diabetes Neuro: Negative; no changes in balance, headaches Skin: Negative; No rashes or skin lesions Psychiatric: Negative; No behavioral problems, depression Sleep: Negative; No snoring, daytime sleepiness, hypersomnolence, bruxism, restless legs, hypnogognic hallucinations, no cataplexy Other comprehensive 14 point system review is negative.   PHYSICAL EXAM:   VS:  BP 128/74   Pulse 61   Ht 5\' 3"  (1.6 m)   Wt 116 lb (52.6 kg)   SpO2 100%   BMI 20.55 kg/m     Blood pressure by me was 124/76  Wt Readings from Last 3 Encounters:  11/27/23 116 lb (52.6 kg)  11/05/23 115 lb 1.3 oz (52.2 kg)  10/27/23 115 lb (52.2 kg)    General: Alert, oriented, no distress.  Skin: normal turgor, no rashes, warm and dry HEENT: Normocephalic, atraumatic. Pupils equal round and reactive to light; sclera anicteric; extraocular muscles intact; Nose without nasal septal hypertrophy Mouth/Parynx benign;  Neck: Soft carotid bruit, no JVD, normal carotid upstroke Lungs: clear to ausculatation and percussion; no wheezing or rales Chest wall: without tenderness to palpitation Heart: PMI not displaced, RRR, s1 s2 normal, 1/6 systolic murmur in the aortic area, no diastolic murmur, no rubs, gallops, thrills, or heaves Abdomen: soft, nontender; no hepatosplenomehaly, BS+; abdominal aorta nontender and not dilated by palpation. Back: no CVA tenderness Pulses 2+ Musculoskeletal: full range of motion, normal strength, no joint deformities Extremities: no clubbing cyanosis or edema, Homan's sign negative  Neurologic: grossly nonfocal; Cranial nerves grossly wnl Psychologic: Normal mood and affect   Studies/Labs Reviewed:   EKG Interpretation Date/Time:  Friday Nov 27 2023 08:30:27 EDT Ventricular Rate:   61 PR Interval:  158 QRS Duration:  80 QT Interval:  434 QTC Calculation: 436 R Axis:   1  Text Interpretation: Normal sinus rhythm Normal ECG When compared with ECG of 08-Sep-2017 04:08, No significant change was found Confirmed by Magnus Schuller ([id number]) on 11/27/2023 8:51:38 AM  September 11, 2020 ECG (independently read by me): Sinus bradycardia at 57, no ectopy  January 2021 ECG (independently read by me): Sinus rhythm at 63 bpm.  Normal intervals.  No ectopy.  No significant ST-T changes    December 2019 ECG (independently read by me): Sinus rhythm at 60 bpm.  No ectopy.  Normal intervals.  Nov 16, 2017 ECG (independently read by me): Sinus bradycardia 55 bpm.  No ST segment changes.  No ectopy.  Recent Labs:    Latest Ref Rng & Units 08/08/2021    9:24 AM 06/04/2021   10:05 AM 03/15/2020   12:32 PM  BMP  Glucose 65 - 99 mg/dL 086  578  469   BUN 6 - 23 mg/dL  16  11   Creatinine 6.29 - 1.20 mg/dL  5.28  4.13   BUN/Creat Ratio 9 - 23   15   Sodium 135 - 145 mEq/L  141  141   Potassium 3.5 - 5.1 mEq/L  4.6  4.3   Chloride 96 - 112 mEq/L  105  106   CO2 19 - 32 mEq/L  27  22   Calcium  8.4 - 10.5 mg/dL  9.9  9.8         Latest Ref Rng & Units 03/15/2020   12:32 PM 10/17/2019    9:12 AM 09/21/2018    9:58 AM  Hepatic Function  Total Protein 6.0 - 8.5 g/dL 6.8  6.8  6.7   Albumin 3.8 - 4.9 g/dL 4.6  4.7  4.9   AST 0 - 40 IU/L 20  23  20    ALT 0 - 32 IU/L 26  35  25   Alk Phosphatase 48 - 121 IU/L 65  75  63   Total Bilirubin 0.0 - 1.2 mg/dL 0.2  0.4  0.4        Latest Ref Rng & Units 10/17/2019    9:12 AM 09/21/2018    9:58 AM 09/08/2017    3:44 AM  CBC  WBC 3.4 - 10.8 x10E3/uL 9.7  12.3  11.9   Hemoglobin 11.1 - 15.9 g/dL 24.4  01.0  27.2   Hematocrit 34.0 - 46.6 % 43.9  41.1  41.2   Platelets 150 - 450 x10E3/uL 330  315  264    Lab Results  Component Value Date   MCV 91 10/17/2019   MCV 93 09/21/2018   MCV 92.2 09/08/2017   Lab Results  Component Value Date   TSH  2.03 10/16/2023   Lab Results  Component Value Date   HGBA1C 6.3 (A) 05/28/2023     BNP No results found for: "BNP"  ProBNP No results found for: "PROBNP"   Lipid Panel     Component Value Date/Time   CHOL 117 09/13/2021 0810   TRIG 62 09/13/2021 0810   HDL 44 09/13/2021 0810   CHOLHDL 2.7 09/13/2021 0810   CHOLHDL 5.6 09/05/2017 0259   VLDL 22 09/05/2017 0259   LDLCALC 60 09/13/2021 0810     RADIOLOGY: No results found.   Additional studies/ records that were reviewed today include:  I reviewed her [facility], cardiac catheterization report, echo Doppler study, subsequent office visits and laboratory.   ASSESSMENT:    1. Non-ST elevation (NSTEMI) myocardial infarction [facility]): September 04, 2017, DES stent to LAD   2. Coronary artery disease involving native coronary artery of native heart without angina pectoris   3. Essential hypertension   4. Stenosis of carotid artery, unspecified laterality  5. Hyperlipidemia LDL goal < 55   6. Diabetes mellitus with cardiac complication [facility])     PLAN:  Ms. Denzil Bristol  is a very pleasant 63 year old female who suffered a non-ST segment elevation myocardial infarction in February 2019 and was found to have high-grade proximal to mid 90% LAD stenosis which was successfully stented with a synergy DES stent.  She has a long-standing history of prior diabetes mellitus, hypertension, hyperlipidemia, and tobacco use.  Fortunately she quit smoking on her day of admission.  Since her initial intervention, she has been without recurrent anginal symptomatology.  Remotely, she had developed mild orthostatic hypotension on lisinopril  which improved following discontinuance of lisinopril .  I have not seen her in over 3 years.  She has continued to be on DAPT with aspirin  and Plavix  and denies any bleeding.  He is diabetic on Metformin , and Jardiance .  Hemoglobin A1c in September 2021 was increased to 7.1.  She continues to be on high  potency statin therapy with atorvastatin  80 mg.  LDL cholesterol in September 2021 was excellent at 56.  Most recent LDL cholesterol in April 2025 was 53.  Presently, she remains asymptomatic.  He does have soft carotid bruit.  I have recommended she undergo carotid duplex imaging.  I will also check 2D echo Doppler study for reassessment of LV function and further evaluation of her systolic murmur.  I will check LP(a) today.  I discussed with her my plans for retirement.  I will transition her to the care of Dr. Randene Bustard for follow-up evaluation in 1 year or sooner as needed.    Medication Adjustments/Labs and Tests Ordered: Current medicines are reviewed at length with the patient today.  Concerns regarding medicines are outlined above.  Medication changes, Labs and Tests ordered today are listed in the Patient Instructions below. Patient Instructions  Medication Instructions:  Stop Plavix  when you run at the end of this perscription *If you need a refill on your cardiac medications before your next appointment, please call your pharmacy*  Lab Work: Today we are going to draw a LPa If you have labs (blood work) drawn today and your tests are completely normal, you will receive your results only by: MyChart Message (if you have MyChart) OR A paper copy in the mail If you have any lab test that is abnormal or we need to change your treatment, we will call you to review the results.  Testing/Procedures: No testing  Follow-Up: At [facility], you and your health needs are our priority.  As part of our continuing mission to provide you with exceptional heart care, our providers are all part of one team.  This team includes your primary Cardiologist (physician) and Advanced Practice Providers or APPs (Physician Assistants and Nurse Practitioners) who all work together to provide you with the care you need, when you need it.  Your next appointment:   1 year(s)  Provider:   Randene Bustard MD  We recommend signing up for the patient portal called "MyChart".  Sign up information is provided on this After Visit Summary.  MyChart is used to connect with patients for Virtual Visits (Telemedicine).  Patients are able to view lab/test results, encounter notes, upcoming appointments, etc.  Non-urgent messages can be sent to your provider as well.   To learn more about what you can do with MyChart, go to [email or web address].    Signed, Magnus Schuller, MD  11/29/2023 9:13 PM    [facility]  [address], [address], [place], [place]  [address] Phone: [phone]

## 2023-11-27 NOTE — Patient Instructions (Signed)
 Medication Instructions:  Stop Plavix  when you run at the end of this perscription *If you need a refill on your cardiac medications before your next appointment, please call your pharmacy*  Lab Work: Today we are going to draw a LPa If you have labs (blood work) drawn today and your tests are completely normal, you will receive your results only by: MyChart Message (if you have MyChart) OR A paper copy in the mail If you have any lab test that is abnormal or we need to change your treatment, we will call you to review the results.  Testing/Procedures: No testing  Follow-Up: At [facility], you and your health needs are our priority.  As part of our continuing mission to provide you with exceptional heart care, our providers are all part of one team.  This team includes your primary Cardiologist (physician) and Advanced Practice Providers or APPs (Physician Assistants and Nurse Practitioners) who all work together to provide you with the care you need, when you need it.  Your next appointment:   1 year(s)  Provider:   Randene Bustard MD  We recommend signing up for the patient portal called "MyChart".  Sign up information is provided on this After Visit Summary.  MyChart is used to connect with patients for Virtual Visits (Telemedicine).  Patients are able to view lab/test results, encounter notes, upcoming appointments, etc.  Non-urgent messages can be sent to your provider as well.   To learn more about what you can do with MyChart, go to [email or web address].

## 2023-11-29 ENCOUNTER — Encounter: Payer: Self-pay | Admitting: Cardiovascular Disease

## 2023-11-30 LAB — LIPOPROTEIN A (LPA): Lipoprotein (a): <8.4 nmol/L (ref <75.0)

## 2023-12-03 ENCOUNTER — Ambulatory Visit: Payer: Self-pay | Admitting: Cardiovascular Disease

## 2023-12-16 ENCOUNTER — Other Ambulatory Visit: Payer: Self-pay | Admitting: Internal Medicine

## 2024-01-13 ENCOUNTER — Other Ambulatory Visit: Payer: Self-pay | Admitting: Family Medicine

## 2024-01-13 DIAGNOSIS — Z1231 Encounter for screening mammogram for malignant neoplasm of breast: Secondary | ICD-10-CM

## 2024-01-16 ENCOUNTER — Other Ambulatory Visit: Payer: Self-pay | Admitting: Nurse Practitioner

## 2024-01-22 ENCOUNTER — Ambulatory Visit
Admission: RE | Admit: 2024-01-22 | Discharge: 2024-01-22 | Disposition: A | Discharge status: Home / Self Care | Source: Ambulatory Visit | Attending: Family Medicine | Admitting: Family Medicine

## 2024-01-22 DIAGNOSIS — Z1231 Encounter for screening mammogram for malignant neoplasm of breast: Secondary | ICD-10-CM

## 2024-03-02 ENCOUNTER — Encounter: Payer: Self-pay | Admitting: Internal Medicine

## 2024-03-02 ENCOUNTER — Ambulatory Visit: Admitting: Internal Medicine

## 2024-03-02 VITALS — BP 122/80 | HR 53 | Ht 63.0 in | Wt 110.0 lb

## 2024-03-02 DIAGNOSIS — E785 Hyperlipidemia, unspecified: Secondary | ICD-10-CM | POA: Diagnosis not present

## 2024-03-02 DIAGNOSIS — Z794 Long term (current) use of insulin: Secondary | ICD-10-CM

## 2024-03-02 DIAGNOSIS — E1142 Type 2 diabetes mellitus with diabetic polyneuropathy: Secondary | ICD-10-CM | POA: Diagnosis not present

## 2024-03-02 DIAGNOSIS — E1140 Type 2 diabetes mellitus with diabetic neuropathy, unspecified: Secondary | ICD-10-CM | POA: Diagnosis not present

## 2024-03-02 LAB — POCT GLYCOSYLATED HEMOGLOBIN (HGB A1C): Hemoglobin A1C: 6.6 % — AB (ref 4.0–5.6)

## 2024-03-02 NOTE — Progress Notes (Signed)
 Patient ID: Carmen Anderson, female   DOB: [birth year], 63 y.o.   MRN: [id number]   HPI: Carmen Anderson is a 63 y.o.-year-old female, initially referred by her PCP, Barnie Commander, NP, returning for follow-up for DM2, dx in 2001-2002, insulin -dependent, now more controlled, with complications (CAD - s/p NSTEMI 2019, s/p stent; PN). Last visit 5 months ago.  Interim history: No increased urination, blurry vision, nausea, chest pain. She has arthritic hand pain. She was taken off Plavix  in 11/2023 by Dr. Burnard, before he retired.  She has less bruising. She continues to exercise consistently on the treadmill and walk at work.  Reviewed HbA1c levels: 10/16/2023: HbA1c 6.7% Lab Results  Component Value Date   HGBA1C 6.3 (A) 05/28/2023   HGBA1C 6.5 01/20/2023   HGBA1C 6.9 (A) 09/18/2022   HGBA1C 7.0 (A) 05/20/2022   HGBA1C 7.1 (A) 01/15/2022   HGBA1C 6.9 (A) 09/19/2021   HGBA1C 7.8 (A) 06/04/2021   HGBA1C 7.3 (A) 01/30/2021   HGBA1C 7.1 (A) 06/04/2020   HGBA1C 7.0 (A) 01/27/2020   HGBA1C 7.4 (A) 09/22/2019   HGBA1C 6.4 (A) 05/18/2019   HGBA1C 7.6 (A) 12/15/2018   HGBA1C 6.7 (A) 08/16/2018   HGBA1C 6.9 (A) 04/13/2018   HGBA1C 6.6 (A) 12/14/2017   HGBA1C 6.9 (H) 09/06/2017   HGBA1C 7 08/14/2017   HGBA1C 6.8 04/13/2017   HGBA1C 6.7 11/25/2016  06/04/2021: HbA1c calculated from fructosamine 6.8% 10/01/2020: HbA1c 7.7%  Pt is on a regimen of: - Metformin  ER 500 mg once a day >> twice a day  - Jardiance  10 mg before breakfast >> 25 mg (increased 10/01/2020) - FiAsp  - added 07/2021 >> NovoLog /Fiasp : Breakfast: 6-8 >> 4-6 units  Lunch: 6 >> 3-4 units Dinner: 6-8 units >> 5-6 units - Tradjenta 5 mg before b'fast - added 01/2023 >> lightheadedness, no difference in sugars >> stopped We tried Ozempic   0.25  mg weekly - added 05/2021 >> severe nausea >> had to stop. She was previously on Januvia . She had diarrhea with higher doses of metformin  ER.  Pt checks her sugars >4x a day with her  Freestyle Libre 3+ CGM:  Previously:  Previously:  Lowest sugar was 38 ...  >> 50s >> 68; she has hypoglycemia awareness in the 50s. Highestin: 200s >> 300s >> 200.  Glucometer: FreeStyle >> [facility]  Pt's meals are: - snack:  1/2 yoghurt, PB + bread >> whole grain sugar-free + plain oatmeal or grits + toast >> oatmeal + stevia +/- bread - Breakfast: wheat waffle + sugar free syrup or wheat muffin w/ 1 egg >> cereals or oatmeal; multi-grain bread - Lunch: salads or [place] sandwich on wheat bread >> banana sandwich with light mayo >> soup or white meat sandwich with multigrain bread - Dinner: soups, salad, meat 2x a weekly (chicken, salmon) + veggies >> veggies or meat + veggies and fried brown rice - Snacks:  celery She continues to exercise 4-5 times a weeks on the treadmill.  -No CKD, last BUN/creatinine:  10/16/2023: 9/0.67, GFR 99, glucose 216, ACR undetectable 05/20/2022: 14/0.6 Lab Results  Component Value Date   BUN 16 06/04/2021   CREATININE 0.80 06/04/2021  10/01/2020: Glucose 128, BUN/creatinine 13/0.68, GFR 96 No results found for: MICRALBCREAT On lisinopril  2.5.  -+ HL; last set of lipids: 10/16/2023: 120/95/49/53 05/20/2022: 138/116/45/70 Lab Results  Component Value Date   CHOL 117 09/13/2021   HDL 44 09/13/2021   LDLCALC 60 09/13/2021   TRIG 62 09/13/2021   CHOLHDL 2.7 09/13/2021  On Lipitor  80, fish oil.  - last eye exam was 04/02/2023: No DR reportedly; [facility].   - + Numbness and tingling and occasional burning in her feet.  On low-dose Neurontin  and B complex. These help.  Last foot exam 10/27/2023.  She had a NSTEMI in 08/2017.  At that time, she had a catheterization and had 90% blockage on LAD.  She had a stent placed.  She has HTN, vitamin D deficiency, controlled hypothyroidism -managed by PCP. Latest TSH levels were normal: 10/16/2023: TSH 2.03 10/01/2020: TSH 2.4 Lab Results  Component Value Date   TSH 2.03 10/16/2023   ROS: + see HPI  I  reviewed pt's medications, allergies, PMH, social hx, family hx, and changes were documented in the history of present illness. Otherwise, unchanged from my initial visit note.  Past Medical History:  Diagnosis Date   DM type 2 (diabetes mellitus, type 2) (HCC)    History of hiatal hernia    Hyperlipidemia    Hypertension    Hypothyroidism    Past Surgical History:  Procedure Laterality Date   ABDOMINAL HYSTERECTOMY  1995   CARDIAC CATHETERIZATION     CORONARY STENT INTERVENTION N/A 09/07/2017   Procedure: CORONARY STENT INTERVENTION;  Surgeon: Court Dorn PARAS, MD;  Location: [facility];  Service: Cardiovascular;  Laterality: N/A;   LEFT HEART CATH AND CORONARY ANGIOGRAPHY N/A 09/07/2017   Procedure: LEFT HEART CATH AND CORONARY ANGIOGRAPHY;  Surgeon: Court Dorn PARAS, MD;  Location: [facility];  Service: Cardiovascular;  Laterality: N/A;   STAPEDECTOMY Right ~ 2002   Social History   Social History   Marital Status: Married    Spouse Name: N/A   Number of Children: 0   Years of Education: 12   Occupational History   property asst. Production designer, theatre/television/film    Social History Main Topics   Smoking status: Current Every Day Smoker -- 0.50 packs/day for 10 years    Types: Cigarettes   Smokeless tobacco: Not on file     Comment: pt states thinking of quitting   Alcohol Use: No   Drug Use: No   Social History Narrative   Exercise: walking, 4 times/week for 30 minutes.   Current Outpatient Medications on File Prior to Visit  Medication Sig Dispense Refill   aspirin  EC 81 MG EC tablet Take 1 tablet (81 mg total) by mouth daily.     atorvastatin  (LIPITOR ) 80 MG tablet TAKE 1 TABLET BY MOUTH DAILY 90 tablet 3   clopidogrel  (PLAVIX ) 75 MG tablet Take 1 tablet (75 mg total) by mouth daily. 90 tablet 0   Continuous Glucose Sensor (FREESTYLE LIBRE 3 PLUS SENSOR) MISC Inject 1 Device into the skin continuous. Change every 15 days 6 each 3   gabapentin  (NEURONTIN ) 100 MG capsule Take  1 capsule (100 mg total) by mouth at bedtime. 90 capsule 3   insulin  aspart (NOVOLOG  FLEXPEN) 100 UNIT/ML FlexPen Inject 4-6 Units into the skin 3 (three) times daily. 30 mL 3   Insulin  Pen Needle (BD PEN NEEDLE NANO 2ND GEN) 32G X 4 MM MISC USE WITH INSULIN  PEN AS DIRECTED 200 each 2   JARDIANCE  25 MG TABS tablet TAKE 1 TABLET BY MOUTH DAILY 90 tablet 3   levothyroxine  (SYNTHROID ) 50 MCG tablet Take 1 tablet (50 mcg total) by mouth daily. 90 tablet 3   metFORMIN  (GLUCOPHAGE -XR) 500 MG 24 hr tablet TAKE 1 TABLET BY MOUTH 2 TIMES A DAY WITH A MEAL 180 tablet 3  nitroGLYCERIN  (NITROSTAT ) 0.4 MG SL tablet Place 1 tablet (0.4 mg total) under the tongue every 5 (five) minutes x 3 doses as needed for chest pain. 25 tablet 3   No current facility-administered medications on file prior to visit.   Allergies  Allergen Reactions   Aspirin  Other (See Comments)    Burns stomach   Codeine Rash   Family History  Problem Relation Age of Onset   Diabetes Mother    Heart disease Mother    Stroke Father        2011   Hyperlipidemia Father    Diabetes Sister    Heart disease Sister    Heart disease Brother        congenital   PE: BP 122/80 (BP Location: Left Arm, Patient Position: Sitting, Cuff Size: Normal)   Pulse (!) 53   Ht 5' 3 (1.6 m)   Wt 110 lb (49.9 kg)   SpO2 97%   BMI 19.49 kg/m   Wt Readings from Last 3 Encounters:  03/02/24 110 lb (49.9 kg)  11/27/23 116 lb (52.6 kg)  11/05/23 115 lb 1.3 oz (52.2 kg)   Constitutional: normal weight, in NAD Eyes:  EOMI, no exophthalmos ENT: no neck masses, no cervical lymphadenopathy Cardiovascular: RRR, No MRG Respiratory: CTA B Musculoskeletal: no deformities Skin:no rashes Neurological: no tremor with outstretched hands  ASSESSMENT: 1. DM2, insulin -dependent, uncontrolled, with complications - CAD, s/p NSTEMI 2019, s/p stent - PN   Component     Latest Ref Rng & Units 08/08/2021  Glucose     65 - 99 mg/dL 857 (H)  C-Peptide      0.80 - 3.85 ng/mL 3.18  Islet Cell Ab     Neg:<1:1 Negative  ZNT8 Antibodies     <15 U/mL <10  Glutamic Acid Decarb Ab     <5 IU/mL <5  No pancreatic autoimmunity or insulin  deficiency.  No family history of medullary thyroid  cancer or personal history of pancreatitis.  2. PN   3. HL  PLAN:  1. Patient with longstanding, uncontrolled, type 2 diabetes, with improved control after her NSTEMI and subsequent stent placement in 2019.  She continues on metformin , SGLT2 inhibitor and mealtime insulin .  She was on a DPP 4 inhibitor in the past but she felt lightheaded did not feel that this was impacting her blood sugars too much, so she stopped. - At last visit, sugars were fairly well-controlled but they were higher after breakfast and there was a trend towards higher blood sugars after lunch.  She had abrupt increases in blood sugars after some of her meals despite the fact that she was taking NovoLog  correctly, 15 to 20 minutes before meals.  We discussed about including healthy fats into her diet to dissipate the increase in blood sugars over a longer period of time.  Given examples.  I advised her that if the sugars did not improve after this, to increase the NovoLog  slightly.HbA1c before last visit was 6.7%, slightly higher than before. CGM interpretation: -At today's visit, we reviewed her CGM downloads: It appears that 95% of values are in target range (goal >70%), while 5% are higher than 180 (goal <25%), and 0% are lower than 70 (goal <4%).  The calculated average blood sugar is 115.  The projected HbA1c for the next 3 months (GMI) is 6.1%. -Reviewing the CGM trends, sugars appear to be better controlled after breakfast and they are mostly very well-controlled later in the day.  She tried round flaxseeds with  breakfast, but she developed diarrhea and had to stop.  We discussed about trying to add other healthy fats and also berries.  She will try this.  For now, I did not suggest a change in  regimen otherwise. - I suggested to:  Patient Instructions  Please continue: - Metformin  ER 500 mg 2x a day with meals - Jardiance  25 mg before breakfast - NovoLog : Breakfast: 4-6 units  Lunch: 3-4 units Dinner: 5-6 units Include fruit (berries) and healthy fats (unsalted nuts, chia seeds, ground flax seeds, avocado).  Please return in 4-6 months.  - we checked her HbA1c: 6.6% (slightly lower, but higher than expected from her CGM) - advised to check sugars at different times of the day - 4x a day, rotating check times - advised for yearly eye exams >> she is UTD - return to clinic in 4-6 months   2. Diabetic PN - Stable - Continues on 100 mg Neurontin  daily and B complex  3. HL -Latest lipid panel was reviewed from 10/16/2023: Fractions at goal with the exception of a slightly low HDL - She continues on Lipitor  80 mg daily and fish oil-no side effects  Lela Fendt, MD PhD [facility]

## 2024-03-02 NOTE — Patient Instructions (Addendum)
 Please continue: - Metformin  ER 500 mg 2x a day with meals - Jardiance  25 mg before breakfast - NovoLog : Breakfast: 4-6 units  Lunch: 3-4 units Dinner: 5-6 units  Include fruit (berries) and healthy fats (unsalted nuts, chia seeds, avocado).  Please return in 4-6 months.

## 2024-04-26 ENCOUNTER — Encounter: Payer: Self-pay | Admitting: Internal Medicine

## 2024-04-26 ENCOUNTER — Telehealth: Payer: Self-pay

## 2024-04-26 MED ORDER — FREESTYLE LIBRE 3 PLUS SENSOR MISC: 1.0000 | Status: AC

## 2024-04-26 NOTE — Telephone Encounter (Signed)
 Sample  Device/Supplies: Libre 3 plus and coupon card Quantity:1 of each  ONU:[id number] EXP:09/10/2024  Requested Prescriptions   Signed Prescriptions Disp Refills   Continuous Glucose Sensor (FREESTYLE LIBRE 3 PLUS SENSOR) MISC      Sig: Inject 1 Device into the skin continuous. Change every 15 days Quantity:1  ONU:[id number] EXP:09/10/2024    Authorizing Provider: TRIXIE FILE    Ordering User: CLEOTILDE ROLIN GORMAN ROLIN GORMAN Maddalynn Barnard 1:06 PM 04/26/2024

## 2024-04-27 NOTE — Telephone Encounter (Signed)
 Patient picked up patient assistance - Log Noted Libre 3 1 box on 04/27/24

## 2024-06-17 ENCOUNTER — Other Ambulatory Visit: Payer: Self-pay | Admitting: Internal Medicine

## 2024-07-15 ENCOUNTER — Other Ambulatory Visit: Payer: Self-pay | Admitting: Internal Medicine

## 2024-07-15 DIAGNOSIS — E1140 Type 2 diabetes mellitus with diabetic neuropathy, unspecified: Secondary | ICD-10-CM

## 2024-09-05 ENCOUNTER — Ambulatory Visit: Admitting: Internal Medicine
